# Patient Record
Sex: Female | Born: 1935 | Race: White | Hispanic: No | Smoking: Current every day smoker
Health system: Southern US, Community
[De-identification: ages and names within clinical notes are randomized; demographics above are authoritative.]

## PROBLEM LIST (undated history)

## (undated) DIAGNOSIS — I509 Heart failure, unspecified: Secondary | ICD-10-CM

## (undated) DIAGNOSIS — J449 Chronic obstructive pulmonary disease, unspecified: Secondary | ICD-10-CM

## (undated) HISTORY — PX: HERNIA REPAIR: SHX51

---

## 2016-03-24 ENCOUNTER — Inpatient Hospital Stay (HOSPITAL_COMMUNITY): Payer: Medicare Other

## 2016-03-24 ENCOUNTER — Inpatient Hospital Stay (HOSPITAL_COMMUNITY)
Admission: AD | Admit: 2016-03-24 | Discharge: 2016-04-01 | DRG: 871 | Disposition: A | Discharge status: Skilled Nursing Facility | Payer: Medicare Other | Source: Other Acute Inpatient Hospital | Attending: Internal Medicine | Admitting: Internal Medicine

## 2016-03-24 DIAGNOSIS — H9190 Unspecified hearing loss, unspecified ear: Secondary | ICD-10-CM | POA: Diagnosis present

## 2016-03-24 DIAGNOSIS — R6521 Severe sepsis with septic shock: Secondary | ICD-10-CM | POA: Diagnosis present

## 2016-03-24 DIAGNOSIS — N179 Acute kidney failure, unspecified: Secondary | ICD-10-CM | POA: Diagnosis present

## 2016-03-24 DIAGNOSIS — E874 Mixed disorder of acid-base balance: Secondary | ICD-10-CM | POA: Diagnosis present

## 2016-03-24 DIAGNOSIS — J9622 Acute and chronic respiratory failure with hypercapnia: Secondary | ICD-10-CM | POA: Diagnosis present

## 2016-03-24 DIAGNOSIS — F17210 Nicotine dependence, cigarettes, uncomplicated: Secondary | ICD-10-CM | POA: Diagnosis present

## 2016-03-24 DIAGNOSIS — J9601 Acute respiratory failure with hypoxia: Secondary | ICD-10-CM

## 2016-03-24 DIAGNOSIS — Z6826 Body mass index (BMI) 26.0-26.9, adult: Secondary | ICD-10-CM | POA: Diagnosis not present

## 2016-03-24 DIAGNOSIS — J189 Pneumonia, unspecified organism: Secondary | ICD-10-CM | POA: Diagnosis present

## 2016-03-24 DIAGNOSIS — E7800 Pure hypercholesterolemia, unspecified: Secondary | ICD-10-CM | POA: Diagnosis present

## 2016-03-24 DIAGNOSIS — T380X5A Adverse effect of glucocorticoids and synthetic analogues, initial encounter: Secondary | ICD-10-CM | POA: Diagnosis present

## 2016-03-24 DIAGNOSIS — J9602 Acute respiratory failure with hypercapnia: Secondary | ICD-10-CM

## 2016-03-24 DIAGNOSIS — Z9981 Dependence on supplemental oxygen: Secondary | ICD-10-CM | POA: Diagnosis not present

## 2016-03-24 DIAGNOSIS — R0602 Shortness of breath: Secondary | ICD-10-CM | POA: Diagnosis present

## 2016-03-24 DIAGNOSIS — G250 Essential tremor: Secondary | ICD-10-CM | POA: Diagnosis present

## 2016-03-24 DIAGNOSIS — J849 Interstitial pulmonary disease, unspecified: Secondary | ICD-10-CM | POA: Diagnosis present

## 2016-03-24 DIAGNOSIS — I480 Paroxysmal atrial fibrillation: Secondary | ICD-10-CM | POA: Diagnosis present

## 2016-03-24 DIAGNOSIS — J441 Chronic obstructive pulmonary disease with (acute) exacerbation: Secondary | ICD-10-CM | POA: Diagnosis present

## 2016-03-24 DIAGNOSIS — D72829 Elevated white blood cell count, unspecified: Secondary | ICD-10-CM | POA: Diagnosis present

## 2016-03-24 DIAGNOSIS — J9621 Acute and chronic respiratory failure with hypoxia: Secondary | ICD-10-CM | POA: Diagnosis present

## 2016-03-24 DIAGNOSIS — R0902 Hypoxemia: Secondary | ICD-10-CM

## 2016-03-24 DIAGNOSIS — I248 Other forms of acute ischemic heart disease: Secondary | ICD-10-CM | POA: Diagnosis present

## 2016-03-24 DIAGNOSIS — R739 Hyperglycemia, unspecified: Secondary | ICD-10-CM | POA: Diagnosis present

## 2016-03-24 DIAGNOSIS — I10 Essential (primary) hypertension: Secondary | ICD-10-CM | POA: Diagnosis present

## 2016-03-24 DIAGNOSIS — D649 Anemia, unspecified: Secondary | ICD-10-CM | POA: Diagnosis present

## 2016-03-24 DIAGNOSIS — A419 Sepsis, unspecified organism: Principal | ICD-10-CM | POA: Diagnosis present

## 2016-03-24 DIAGNOSIS — I700 Atherosclerosis of aorta: Secondary | ICD-10-CM | POA: Diagnosis present

## 2016-03-24 DIAGNOSIS — R0600 Dyspnea, unspecified: Secondary | ICD-10-CM | POA: Diagnosis not present

## 2016-03-24 DIAGNOSIS — E46 Unspecified protein-calorie malnutrition: Secondary | ICD-10-CM | POA: Diagnosis present

## 2016-03-24 DIAGNOSIS — E870 Hyperosmolality and hypernatremia: Secondary | ICD-10-CM | POA: Diagnosis present

## 2016-03-24 DIAGNOSIS — J440 Chronic obstructive pulmonary disease with acute lower respiratory infection: Secondary | ICD-10-CM | POA: Diagnosis present

## 2016-03-24 DIAGNOSIS — J9690 Respiratory failure, unspecified, unspecified whether with hypoxia or hypercapnia: Secondary | ICD-10-CM

## 2016-03-24 DIAGNOSIS — I959 Hypotension, unspecified: Secondary | ICD-10-CM | POA: Diagnosis not present

## 2016-03-24 HISTORY — DX: Chronic obstructive pulmonary disease, unspecified: J44.9

## 2016-03-24 LAB — MAGNESIUM: MAGNESIUM: 1.8 mg/dL (ref 1.7–2.4)

## 2016-03-24 LAB — COMPREHENSIVE METABOLIC PANEL
ALBUMIN: 2.4 g/dL — AB (ref 3.5–5.0)
ALT: 16 U/L (ref 14–54)
AST: 20 U/L (ref 15–41)
Alkaline Phosphatase: 61 U/L (ref 38–126)
Anion gap: 8 (ref 5–15)
BUN: 15 mg/dL (ref 6–20)
CHLORIDE: 104 mmol/L (ref 101–111)
CO2: 34 mmol/L — AB (ref 22–32)
Calcium: 8.6 mg/dL — ABNORMAL LOW (ref 8.9–10.3)
Creatinine, Ser: 0.95 mg/dL (ref 0.44–1.00)
GFR calc Af Amer: >60 mL/min (ref >60)
GFR, EST NON AFRICAN AMERICAN: 55 mL/min — AB (ref >60)
Glucose, Bld: 136 mg/dL — ABNORMAL HIGH (ref 65–99)
POTASSIUM: 4.7 mmol/L (ref 3.5–5.1)
SODIUM: 146 mmol/L — AB (ref 135–145)
Total Bilirubin: 0.6 mg/dL (ref 0.3–1.2)
Total Protein: 5.7 g/dL — ABNORMAL LOW (ref 6.5–8.1)

## 2016-03-24 LAB — CBC WITH DIFFERENTIAL/PLATELET
BASOS ABS: 0 10*3/uL (ref 0.0–0.1)
Basophils Relative: 0 %
EOS ABS: 0 10*3/uL (ref 0.0–0.7)
EOS PCT: 0 %
HCT: 33.4 % — ABNORMAL LOW (ref 36.0–46.0)
Hemoglobin: 9.7 g/dL — ABNORMAL LOW (ref 12.0–15.0)
LYMPHS PCT: 4 %
Lymphs Abs: 0.6 10*3/uL — ABNORMAL LOW (ref 0.7–4.0)
MCH: 28.7 pg (ref 26.0–34.0)
MCHC: 29 g/dL — ABNORMAL LOW (ref 30.0–36.0)
MCV: 98.8 fL (ref 78.0–100.0)
Monocytes Absolute: 0.4 10*3/uL (ref 0.1–1.0)
Monocytes Relative: 3 %
NEUTROS PCT: 93 %
Neutro Abs: 12.7 10*3/uL — ABNORMAL HIGH (ref 1.7–7.7)
PLATELETS: 252 10*3/uL (ref 150–400)
RBC: 3.38 MIL/uL — AB (ref 3.87–5.11)
RDW: 16.2 % — ABNORMAL HIGH (ref 11.5–15.5)
WBC: 13.7 10*3/uL — AB (ref 4.0–10.5)

## 2016-03-24 LAB — GLUCOSE, CAPILLARY
GLUCOSE-CAPILLARY: 142 mg/dL — AB (ref 65–99)
GLUCOSE-CAPILLARY: 164 mg/dL — AB (ref 65–99)
Glucose-Capillary: 144 mg/dL — ABNORMAL HIGH (ref 65–99)

## 2016-03-24 LAB — BLOOD GAS, ARTERIAL
Acid-Base Excess: 10.5 mmol/L — ABNORMAL HIGH (ref 0.0–2.0)
BICARBONATE: 35.6 mmol/L — AB (ref 20.0–28.0)
Drawn by: 39899
FIO2: 30
LHR: 28 {breaths}/min
O2 Saturation: 89.8 %
PEEP: 5 cmH2O
PO2 ART: 55.1 mmHg — AB (ref 83.0–108.0)
Patient temperature: 99.9
VT: 310 mL
pCO2 arterial: 60.8 mmHg — ABNORMAL HIGH (ref 32.0–48.0)
pH, Arterial: 7.39 (ref 7.350–7.450)

## 2016-03-24 LAB — TRIGLYCERIDES: TRIGLYCERIDES: 98 mg/dL (ref <150)

## 2016-03-24 LAB — PHOSPHORUS: Phosphorus: 2.4 mg/dL — ABNORMAL LOW (ref 2.5–4.6)

## 2016-03-24 LAB — PROCALCITONIN: Procalcitonin: 0.14 ng/mL

## 2016-03-24 LAB — MRSA PCR SCREENING: MRSA BY PCR: NEGATIVE

## 2016-03-24 LAB — TROPONIN I
TROPONIN I: 0.05 ng/mL — AB (ref <0.03)
Troponin I: 0.06 ng/mL (ref <0.03)

## 2016-03-24 LAB — LACTIC ACID, PLASMA
LACTIC ACID, VENOUS: 2.5 mmol/L — AB (ref 0.5–1.9)
LACTIC ACID, VENOUS: 2.9 mmol/L — AB (ref 0.5–1.9)

## 2016-03-24 MED ORDER — FENTANYL CITRATE (PF) 100 MCG/2ML IJ SOLN: 50.0000 ug | INTRAMUSCULAR | Status: DC | PRN

## 2016-03-24 MED ORDER — METHYLPREDNISOLONE SODIUM SUCC 40 MG IJ SOLR
20.0000 mg | Freq: Two times a day (BID) | INTRAMUSCULAR | Status: DC
  Administered 2016-03-24 – 2016-03-27 (×6): 20 mg via INTRAVENOUS
  Filled 2016-03-24 (×6): qty 0.5

## 2016-03-24 MED ORDER — VITAL HIGH PROTEIN PO LIQD
1000.0000 mL | ORAL | Status: DC
  Administered 2016-03-24: 1000 mL

## 2016-03-24 MED ORDER — NICOTINE 21 MG/24HR TD PT24
21.0000 mg | MEDICATED_PATCH | Freq: Every day | TRANSDERMAL | Status: DC
  Administered 2016-03-25 – 2016-04-01 (×8): 21 mg via TRANSDERMAL
  Filled 2016-03-24 (×8): qty 1

## 2016-03-24 MED ORDER — HEPARIN SODIUM (PORCINE) 5000 UNIT/ML IJ SOLN
5000.0000 [IU] | Freq: Three times a day (TID) | INTRAMUSCULAR | Status: DC
  Administered 2016-03-24 – 2016-04-01 (×24): 5000 [IU] via SUBCUTANEOUS
  Filled 2016-03-24 (×27): qty 1

## 2016-03-24 MED ORDER — ORAL CARE MOUTH RINSE
15.0000 mL | OROMUCOSAL | Status: DC
  Administered 2016-03-24 – 2016-03-27 (×26): 15 mL via OROMUCOSAL

## 2016-03-24 MED ORDER — BUDESONIDE 0.5 MG/2ML IN SUSP
0.5000 mg | Freq: Two times a day (BID) | RESPIRATORY_TRACT | Status: DC
  Administered 2016-03-24 – 2016-04-01 (×16): 0.5 mg via RESPIRATORY_TRACT
  Filled 2016-03-24 (×17): qty 2

## 2016-03-24 MED ORDER — IPRATROPIUM-ALBUTEROL 0.5-2.5 (3) MG/3ML IN SOLN
3.0000 mL | Freq: Four times a day (QID) | RESPIRATORY_TRACT | Status: DC
  Administered 2016-03-24 – 2016-03-25 (×3): 3 mL via RESPIRATORY_TRACT
  Filled 2016-03-24 (×3): qty 3

## 2016-03-24 MED ORDER — PRO-STAT SUGAR FREE PO LIQD
30.0000 mL | Freq: Two times a day (BID) | ORAL | Status: DC
  Administered 2016-03-24 – 2016-03-25 (×2): 30 mL
  Filled 2016-03-24 (×2): qty 30

## 2016-03-24 MED ORDER — INSULIN ASPART 100 UNIT/ML ~~LOC~~ SOLN
0.0000 [IU] | SUBCUTANEOUS | Status: DC
  Administered 2016-03-24 (×2): 1 [IU] via SUBCUTANEOUS
  Administered 2016-03-24 – 2016-03-25 (×3): 2 [IU] via SUBCUTANEOUS
  Administered 2016-03-25 (×2): 1 [IU] via SUBCUTANEOUS
  Administered 2016-03-25: 2 [IU] via SUBCUTANEOUS
  Administered 2016-03-26: 1 [IU] via SUBCUTANEOUS
  Administered 2016-03-26 (×3): 2 [IU] via SUBCUTANEOUS
  Administered 2016-03-26 (×3): 1 [IU] via SUBCUTANEOUS
  Administered 2016-03-27 (×2): 2 [IU] via SUBCUTANEOUS
  Administered 2016-03-27: 1 [IU] via SUBCUTANEOUS
  Administered 2016-03-29 (×2): 2 [IU] via SUBCUTANEOUS

## 2016-03-24 MED ORDER — FAMOTIDINE IN NACL 20-0.9 MG/50ML-% IV SOLN
20.0000 mg | Freq: Two times a day (BID) | INTRAVENOUS | Status: DC
  Administered 2016-03-24: 20 mg via INTRAVENOUS
  Filled 2016-03-24 (×4): qty 50

## 2016-03-24 MED ORDER — ACETAMINOPHEN 325 MG PO TABS
650.0000 mg | ORAL_TABLET | ORAL | Status: DC | PRN
  Administered 2016-04-01: 650 mg via ORAL

## 2016-03-24 MED ORDER — SODIUM CHLORIDE 0.9 % IV SOLN
250.0000 mL | INTRAVENOUS | Status: DC | PRN
  Administered 2016-03-26 – 2016-03-27 (×2): 250 mL via INTRAVENOUS

## 2016-03-24 MED ORDER — DEXTROSE 5 % IV SOLN
2.0000 g | INTRAVENOUS | Status: DC
  Administered 2016-03-24 – 2016-03-31 (×8): 2 g via INTRAVENOUS
  Filled 2016-03-24 (×9): qty 2

## 2016-03-24 MED ORDER — CHLORHEXIDINE GLUCONATE 0.12% ORAL RINSE (MEDLINE KIT)
15.0000 mL | Freq: Two times a day (BID) | OROMUCOSAL | Status: DC
  Administered 2016-03-24 – 2016-03-27 (×6): 15 mL via OROMUCOSAL

## 2016-03-24 MED ORDER — FENTANYL CITRATE (PF) 100 MCG/2ML IJ SOLN
50.0000 ug | INTRAMUSCULAR | Status: DC | PRN
  Administered 2016-03-24 – 2016-03-25 (×2): 50 ug via INTRAVENOUS
  Filled 2016-03-24 (×2): qty 2

## 2016-03-24 MED ORDER — DEXTROSE 5 % IV SOLN
250.0000 mg | INTRAVENOUS | Status: AC
  Administered 2016-03-24 – 2016-03-25 (×2): 250 mg via INTRAVENOUS
  Filled 2016-03-24 (×3): qty 250

## 2016-03-24 MED ORDER — VANCOMYCIN HCL IN DEXTROSE 1-5 GM/200ML-% IV SOLN
1000.0000 mg | INTRAVENOUS | Status: DC
  Administered 2016-03-24 – 2016-03-26 (×3): 1000 mg via INTRAVENOUS
  Filled 2016-03-24 (×4): qty 200

## 2016-03-24 MED ORDER — ASPIRIN EC 81 MG PO TBEC
81.0000 mg | DELAYED_RELEASE_TABLET | Freq: Every day | ORAL | Status: DC
  Administered 2016-03-24: 81 mg via ORAL
  Filled 2016-03-24 (×2): qty 1

## 2016-03-24 MED ORDER — PROPOFOL 1000 MG/100ML IV EMUL
0.0000 ug/kg/min | INTRAVENOUS | Status: DC
  Administered 2016-03-24: 15 ug/kg/min via INTRAVENOUS
  Administered 2016-03-25: 20 ug/kg/min via INTRAVENOUS
  Administered 2016-03-25 – 2016-03-26 (×2): 10 ug/kg/min via INTRAVENOUS
  Administered 2016-03-27: 20 ug/kg/min via INTRAVENOUS
  Filled 2016-03-24 (×5): qty 100

## 2016-03-24 MED ORDER — PROPOFOL 1000 MG/100ML IV EMUL
0.0000 ug/kg/min | INTRAVENOUS | Status: DC
  Administered 2016-03-24: 15 ug/kg/min via INTRAVENOUS

## 2016-03-24 NOTE — Progress Notes (Signed)
Sputum sample obtained and sent down to main lab without complications.  

## 2016-03-24 NOTE — Progress Notes (Signed)
Pharmacy Antibiotic Note  [name] Dupuis is a 82 y.o. female transferred from [facility] on 03/24/2016 with VDRF.  Pharmacy has been consulted for vancomycin/cefepime dosing for possible sepsis/PNA. Pt was on rocephin and azithromycin prior to transfer. Scr 0.8 per OSH record, weight = 49 kg per RN, est. crcl ~ 40-45 ml/min.    Plan: Vancomycin 1g IV Q 24 hrs Cefepime 2g IV Q 24 hrs  Monitor renal function and f/u cultures  Height: 4\' 7"  (139.7 cm) IBW/kg (Calculated) : 34  No data recorded.  No results for input(s): WBC, CREATININE, LATICACIDVEN, VANCOTROUGH, VANCOPEAK, VANCORANDOM, GENTTROUGH, GENTPEAK, GENTRANDOM, TOBRATROUGH, TOBRAPEAK, TOBRARND, AMIKACINPEAK, AMIKACINTROU, AMIKACIN in the last 168 hours.  CrCl cannot be calculated (Unknown ideal weight.).    Allergies not on file  Antimicrobials this admission: Vancomycin 9/25 >>  Cefepime 9/25 >>   Dose adjustments this admission:   Microbiology results: 9/25 BCx:   9/25 Sputum:  9/25 MRSA PCR:  Thank you for allowing pharmacy to be a part of this patient's care.  Bayard HuggerMei Rochel Privett, PharmD, BCPS  Clinical Pharmacist  Pager: [phone]   03/24/2016 4:56 PM

## 2016-03-24 NOTE — Progress Notes (Signed)
eLink Physician-Brief Progress Note Patient Name: [name] Paluch DOB: 12/30/1932 MRN: [id number]   Date of Service  03/24/2016  HPI/Events of Note  Lactic Acid = 2.6 >> 2.9. This presents a bit of a dilemma. CXR looks like mild CHF, No echo in EPIC. No CVL to check CVP.  eICU Interventions  Will ask ground to to place CVL and check CVP prior to giving more fluid.     Intervention Category Major Interventions: Acid-Base disturbance - evaluation and management  Niasha Devins Eugene 03/24/2016, 11:38 PM

## 2016-03-24 NOTE — Progress Notes (Signed)
eLink Physician-Brief Progress Note Patient Name: Dana [name] DOB: 02/13/1933 MRN: [id number]   Date of Service  03/24/2016  HPI/Events of Note  82 yo woman, transferred from [place] with VDRF. To the ICU on propofol, some evident agitation. Dr DeDios at bedside now.   eICU Interventions       Intervention Category Minor Interventions: Agitation / anxiety - evaluation and management  Dana [name]. 03/24/2016, 4:34 PM

## 2016-03-24 NOTE — H&P (Addendum)
PULMONARY / CRITICAL CARE MEDICINE   Name: Dana [name] MRN: [id number] DOB: 1932/07/14    ADMISSION DATE:  03/24/2016 CONSULTATION DATE:  9/25  REFERRING MD:  Boneta Lucks   CHIEF COMPLAINT:  Acute on chronic respiratory failure   HISTORY OF PRESENT ILLNESS:   82 year old female admitted to [facility] on 9/22 w/ working dx of CAP, AECOPD and  Sepsis. She is on 2 liters Oxygen at baseline. She was treated in usual fashion which included: azith/roceph, systemic steroids, inhaled BDs and supplemental oxygen.  Over the course of her stay she required escalating supplemental oxygen going from high-flow to BIPAP d/t increasing WOB. On the am of 9/25 the pt was desaturating further and also demonstrated increased CO2 retention w/ PCO2 of 92. CXR obtained that am raising concern for increasing edema. Was given IV lasix, but ultimately intubated. Her abx were widened to include: vanc and cefepime on 9/25, azithro was continued. She was accepted at [facility] but no beds were available so she was transferred to [facility].   PAST MEDICAL HISTORY :   Chronic obstructive airway disease, chronic hypoxic respiratory failure, secondary PAH, essential htn, LVHtremor, aortic sclerosis, tobacco abuse, hypercholesterolemia, hard of hearing,   PAST SURGICAL HISTORY: Prior hernia repair   ALLERGIES  No known drug allergies   MEDICATIONS (home meds) Atorvastatin 40mg /day, symbicort 80/4.5 2 puffs/bid, HCTZ 25mg ; take 1/2 tab day, lisinopril 20mg  daily, randidine 150mg  bid as needed for heart burn   FAMILY HISTORY:  Her mother had cancer, daughter has diabetes   SOCIAL HISTORY: She is an active smoker; 1ppd, has smoked since age 37.  Lives w/ daughter who assists with her medications   REVIEW OF SYSTEMS:   Unable-->sedated   SUBJECTIVE:  Comfortable on the vent. Had some agitation on transport but better now with propofol.  No increased WOB noted.  We dropped her Fio2 to 30% and her O2 sats remained  99%  VITAL SIGNS: Pulse 92   Resp (!) 28   Ht 4\' 7"  (1.397 m)   SpO2 100%     HEMODYNAMICS:    VENTILATOR SETTINGS: Vent Mode: PRVC FiO2 (%):  [30 %] 30 % Set Rate:  [28 bmp] 28 bmp Vt Set:  [310 mL] 310 mL PEEP:  [5 cmH20] 5 cmH20 Plateau Pressure:  [15 cmH20] 15 cmH20  INTAKE / OUTPUT: No intake/output data recorded.  PHYSICAL EXAMINATION: General:  Arousable. Comfortable.  Follows simple commands. NAD Neuro:  CN grossly intact. (-) lateralizing signs.  HEENT:  PERLA. (-) facial asymetry. ETT in place size 7.5. (-) NVD.  Cardiovascular:  Good s1/s2. (-) s3/m/r/g Lungs: good ae. Some crackles at bases. (-) rhonchi or wheezing heard. (-) accessory muscle use Abdomen:  (+) BS, soft, NT. (-) masses.  Musculoskeletal:  Warm extremities. Some trace edema. (-) clubbing.  Skin:  Warm and dry. Some erythematous bruising at BLE/legs.   LABS:  BMET No results for input(s): NA, K, CL, CO2, BUN, CREATININE, GLUCOSE in the last 168 hours.  Electrolytes No results for input(s): CALCIUM, MG, PHOS in the last 168 hours.  CBC No results for input(s): WBC, HGB, HCT, PLT in the last 168 hours.  Coag's No results for input(s): APTT, INR in the last 168 hours.  Sepsis Markers No results for input(s): LATICACIDVEN, PROCALCITON, O2SATVEN in the last 168 hours.  ABG No results for input(s): PHART, PCO2ART, PO2ART in the last 168 hours.  Liver Enzymes No results for input(s): AST, ALT, ALKPHOS, BILITOT, ALBUMIN in the  last 168 hours.  Cardiac Enzymes No results for input(s): TROPONINI, PROBNP in the last 168 hours.  Glucose No results for input(s): GLUCAP in the last 168 hours.  Imaging No results found.   STUDIES:  ECHO 9/25>>>  CULTURES: BC at [facility] (9/24)>>> Sputum 9/25>>>  ANTIBIOTICS: Rocephin 8/22>>>8/2 azithro 9/22>>> Cefepime 9/22>>> vanc 9/22>>>  SIGNIFICANT EVENTS: 9/22 to 9/25 admitted w/ working dx of AECOPD and CAP. Treated in usual fashion: ABX,  systemic steroids, BDs, O2. Continued to decline clinically w/ worsening WOB, hypercapnia and possible pulmonary edema. Intubated and transferred to [facility] on 9/25.    LINES/TUBES: OETT 9/25 at [facility]>>>  DISCUSSION: 82 year old female admitted to [facility] hospital w/ acute on chronic respiratory failure in setting of AECOPD and CAP. Course c/b troponin leak, and persistent/progressive respiratory failure ultimately leading to need for intubation. There was report of pulmonary edema prior to intubation. At this point the goal will be to first obtain CXR, then try to determine to what extent her worsening respiratory failure is due to PNA vs edema vs ALI, and also what degree her underlying obstructive lung disease is contributing. For now we will continue supportive care, abx, systemic steroids, inhaled BDs, and lasix as her BP and BUN/creatinine will permit. We will also repeat CEs, as well as obtain echo to r/o cardiac component. Extensive discussions w/ family were recorded at [facility] re: goals of care and risk of failure to wean. Ultimately on-going goals of care discussion will be needed.   ASSESSMENT / PLAN:  PULMONARY A: Acute on Chronic Hypoxic and Hypercarbic respiratory failure in setting of CAP and AECOPD +/- pulmonary edema vs ALI. R/O HCAP.  Tobacco abuse  P:   Full vent support. RR and TV were adjusted prior to transport as she was retaining.  ABG was 7.19, 103, 93 on 50% and TV 200 Rate of 24. Vent changes have been made. We decreased her Fio2 to 30% and her O2 sats were 99%. Check ABG in an hour. Doubt its ALI at this point.  Sputum culture PAD protocol Cont scheduled BDs Taper steroids slowly CXR now.  Cont broad spectrum abx.  Holding off on diuresis as BP dropped with diuresis earlier (got Lasix 40 mgIV).     CARDIOVASCULAR A:  H/o HTN  Elevated troponin (at [facility])-->felt demand ischemia  P:  Repeat 12 lead; CEs and obtain ECHO to r/o WM abnormality  Cont tele  Cont  asa  Holding home HCTZ and Lisinopril   RENAL A:   Mild Hypernatremia (Na 146) P:   Rpt BMP > may need free water per OGT Trend chemistry  KVO IVFs   GASTROINTESTINAL A:   Protein calorie malnutrition in setting of chronic illness as well as acute critical illness  P:   Nutritional consult-->start tubefeeds H2 blockade for SUP  HEMATOLOGIC A:   Anemia of critical illness. No evidence of active bleed  P:  [place] heparin  Trend CBC Transfuse per ICU protocol   INFECTIOUS A:   CAP; possible HCAP.  P:   Day 3/5 azith Day 0/x vanc Day 0/x cefepime  Get sputum culture  Trend PCT algo   ENDOCRINE A:   No acute  P:   Trend glucose  SS insulin q4 while NPO.   NEUROLOGIC A:   Sedation  P:   RASS goal: -1  PAD protocol  Fentanyl prn.    FAMILY  - Updates: I called up pt's daughter and was not able to get hold of  her.  Left a VM.   - Inter-disciplinary family meet or Palliative Care meeting due by:  10/1  Critical Care time with this patient today : 35 minutes. I reveiwed pt's course and labs at Care Everywhere.  She usually lives in 6060s for CO2.  Her WBC was slowly trending up (21 today) and could be related to steroids. CXR with possible LLL PNA and with inc vascular markings.    Dana MeyerJ. Angelo A de Dios, MD 03/24/2016, 5:15 PM [facility] Pager [phone] After 3 pm or if no answer, call [phone]

## 2016-03-24 NOTE — Progress Notes (Signed)
CRITICAL VALUE ALERT  Critical value received:  Troponin 0.06    Lactic Acid 2.5   Date of notification:  03/24/16  Time of notification:  2020  Critical value read back:Yes.    Nurse who received alert:  Marcellina MillinMindy Hopper RN  MD notified (1st page):  Dr. Delton CoombesByrum  Time of first page:  2045  MD notified (2nd page):  Time of second page:  Responding MD:  Dr. Delton CoombesByrum  Time MD responded:  2045

## 2016-03-25 ENCOUNTER — Inpatient Hospital Stay (HOSPITAL_COMMUNITY): Payer: Medicare Other

## 2016-03-25 DIAGNOSIS — A419 Sepsis, unspecified organism: Principal | ICD-10-CM

## 2016-03-25 LAB — CBC
HEMATOCRIT: 29.8 % — AB (ref 36.0–46.0)
HEMOGLOBIN: 8.7 g/dL — AB (ref 12.0–15.0)
MCH: 28.2 pg (ref 26.0–34.0)
MCHC: 29.2 g/dL — AB (ref 30.0–36.0)
MCV: 96.8 fL (ref 78.0–100.0)
Platelets: 222 10*3/uL (ref 150–400)
RBC: 3.08 MIL/uL — ABNORMAL LOW (ref 3.87–5.11)
RDW: 15.9 % — ABNORMAL HIGH (ref 11.5–15.5)
WBC: 14.2 10*3/uL — ABNORMAL HIGH (ref 4.0–10.5)

## 2016-03-25 LAB — GLUCOSE, CAPILLARY
GLUCOSE-CAPILLARY: 140 mg/dL — AB (ref 65–99)
GLUCOSE-CAPILLARY: 154 mg/dL — AB (ref 65–99)
GLUCOSE-CAPILLARY: 163 mg/dL — AB (ref 65–99)
Glucose-Capillary: 155 mg/dL — ABNORMAL HIGH (ref 65–99)
Glucose-Capillary: 160 mg/dL — ABNORMAL HIGH (ref 65–99)
Glucose-Capillary: 124 mg/dL — ABNORMAL HIGH (ref 65–99)

## 2016-03-25 LAB — BASIC METABOLIC PANEL
Anion gap: 7 (ref 5–15)
BUN: 23 mg/dL — AB (ref 6–20)
CHLORIDE: 104 mmol/L (ref 101–111)
CO2: 33 mmol/L — AB (ref 22–32)
CREATININE: 0.93 mg/dL (ref 0.44–1.00)
Calcium: 8.5 mg/dL — ABNORMAL LOW (ref 8.9–10.3)
GFR calc non Af Amer: 56 mL/min — ABNORMAL LOW (ref >60)
GLUCOSE: 147 mg/dL — AB (ref 65–99)
Potassium: 3.6 mmol/L (ref 3.5–5.1)
Sodium: 144 mmol/L (ref 135–145)

## 2016-03-25 LAB — BLOOD GAS, ARTERIAL
ACID-BASE EXCESS: 2.4 mmol/L — AB (ref 0.0–2.0)
BICARBONATE: 27.2 mmol/L (ref 20.0–28.0)
Drawn by: 345601
FIO2: 40
LHR: 18 {breaths}/min
O2 Saturation: 98.1 %
PEEP: 5 cmH2O
Patient temperature: 99.6
VT: 350 mL
pCO2 arterial: 49.2 mmHg — ABNORMAL HIGH (ref 32.0–48.0)
pH, Arterial: 7.364 (ref 7.350–7.450)
pO2, Arterial: 149 mmHg — ABNORMAL HIGH (ref 83.0–108.0)

## 2016-03-25 LAB — MAGNESIUM
Magnesium: 1.8 mg/dL (ref 1.7–2.4)
Magnesium: 2.1 mg/dL (ref 1.7–2.4)

## 2016-03-25 LAB — TROPONIN I: Troponin I: 0.06 ng/mL (ref <0.03)

## 2016-03-25 LAB — PHOSPHORUS
PHOSPHORUS: 1.1 mg/dL — AB (ref 2.5–4.6)
Phosphorus: 3 mg/dL (ref 2.5–4.6)

## 2016-03-25 LAB — PROCALCITONIN: Procalcitonin: 0.11 ng/mL

## 2016-03-25 LAB — LACTIC ACID, PLASMA: Lactic Acid, Venous: 1.9 mmol/L (ref 0.5–1.9)

## 2016-03-25 MED ORDER — FENTANYL CITRATE (PF) 100 MCG/2ML IJ SOLN
25.0000 ug | INTRAMUSCULAR | Status: DC | PRN
  Administered 2016-03-26: 50 ug via INTRAVENOUS
  Filled 2016-03-25: qty 2

## 2016-03-25 MED ORDER — POTASSIUM PHOSPHATES 15 MMOLE/5ML IV SOLN
30.0000 mmol | Freq: Once | INTRAVENOUS | Status: AC
  Administered 2016-03-25: 30 mmol via INTRAVENOUS
  Filled 2016-03-25: qty 10

## 2016-03-25 MED ORDER — VITAL AF 1.2 CAL PO LIQD
1000.0000 mL | ORAL | Status: DC
  Administered 2016-03-25 – 2016-03-26 (×2): 1000 mL
  Filled 2016-03-25 (×5): qty 1000

## 2016-03-25 MED ORDER — MAGNESIUM SULFATE IN D5W 1-5 GM/100ML-% IV SOLN
1.0000 g | Freq: Once | INTRAVENOUS | Status: AC
  Administered 2016-03-25: 1 g via INTRAVENOUS
  Filled 2016-03-25: qty 100

## 2016-03-25 MED ORDER — SODIUM CHLORIDE 0.9 % IV BOLUS (SEPSIS)
1000.0000 mL | Freq: Once | INTRAVENOUS | Status: AC
  Administered 2016-03-25: 1000 mL via INTRAVENOUS

## 2016-03-25 MED ORDER — POTASSIUM PHOSPHATE NICU ORAL SYRINGE 4.4 MEQ/ML
30.0000 mmol | Freq: Once | ORAL | Status: DC
  Filled 2016-03-25: qty 10

## 2016-03-25 MED ORDER — FAMOTIDINE IN NACL 20-0.9 MG/50ML-% IV SOLN
20.0000 mg | INTRAVENOUS | Status: DC
  Administered 2016-03-25 – 2016-03-29 (×5): 20 mg via INTRAVENOUS
  Filled 2016-03-25 (×6): qty 50

## 2016-03-25 MED ORDER — IPRATROPIUM-ALBUTEROL 0.5-2.5 (3) MG/3ML IN SOLN
3.0000 mL | RESPIRATORY_TRACT | Status: DC
  Administered 2016-03-25 – 2016-03-29 (×22): 3 mL via RESPIRATORY_TRACT
  Filled 2016-03-25 (×23): qty 3

## 2016-03-25 MED ORDER — POTASSIUM PHOSPHATES 15 MMOLE/5ML IV SOLN: 30.0000 mmol | Freq: Once | INTRAVENOUS | Status: DC

## 2016-03-25 MED ORDER — ASPIRIN 81 MG PO CHEW
81.0000 mg | CHEWABLE_TABLET | Freq: Every day | ORAL | Status: DC
  Administered 2016-03-25 – 2016-04-01 (×6): 81 mg via ORAL
  Filled 2016-03-25 (×6): qty 1

## 2016-03-25 MED ORDER — FENTANYL CITRATE (PF) 100 MCG/2ML IJ SOLN: 50.0000 ug | INTRAMUSCULAR | Status: DC | PRN

## 2016-03-25 NOTE — Progress Notes (Signed)
PCCM INTERVAL PROGRESS NOTE  To bedside to evaluate patient for CVL placement in the setting of lactic acid 2.5 > 2.9. Concern that she appeared volume overloaded on CXR earlier. She is on 30% FiO2 and 5 PEEP and has sats in the high 90s. She is hemodynamically stable on no pressors. She has poor urine output [facility]. Discussed with Dr. Marchelle Gearingamaswamy and at this time we will attempt IVF and repeat a lactic acid. No true benefit to CVL placement at this point.    Joneen RoachPaul Hoffman, AGACNP-BC [facility]/Critical Care Pager [phone] or [phone]  03/25/2016 12:15 AM

## 2016-03-25 NOTE — Progress Notes (Signed)
Initial Nutrition Assessment  DOCUMENTATION CODES:   Not applicable  INTERVENTION:    Initiate TF via OGT with Vital AF 1.2 at goal rate of 40 ml/h (960 ml per day) to provide 1152 kcals, 72 gm protein, 779 ml free water daily.  Total intake with TF and Propofol will be 1229 kcal (104% of estimated needs).  NUTRITION DIAGNOSIS:   Inadequate oral intake related to inability to eat as evidenced by NPO status.  GOAL:   Patient will meet greater than or equal to 90% of their needs  MONITOR:   Vent status, TF tolerance, I & O's, Labs  REASON FOR ASSESSMENT:   Consult Enteral/tube feeding initiation and management  ASSESSMENT:   82 yo female [facility]/22 w/ working dx of AECOPD and CAP. Continued to decline clinically w/ worsening WOB, hypercapnia and possible pulmonary edema. [facility].    Received MD Consult for TF initiation and management. Patient is currently intubated on ventilator support MV: 6.5 L/min Temp (24hrs), Avg:100 F (37.8 C), Min:99.5 F (37.5 C), Max:101 F (38.3 C)  Propofol: 2.9 ml/hr providing 77 kcal  Labs reviewed: phosphorus low Medications reviewed and include insulin, Mag sulfate, potassium phosphate, solumedrol, propofol.   Unable to complete Nutrition-Focused physical exam at this time.   Diet Order:  Diet NPO time specified  Skin:  Reviewed, no issues  Last BM:  unknown  Height:   Ht Readings from Last 1 Encounters:  03/24/16 4\' 7"  (1.397 m)    Weight:   Wt Readings from Last 1 Encounters:  03/25/16 112 lb 14 oz (51.2 kg)    Ideal Body Weight:  41.7 kg  BMI:  Body mass index is 26.23 kg/m.  Estimated Nutritional Needs:   Kcal:  1178  Protein:  65-75 gm  Fluid:  1.3-1.5 L  EDUCATION NEEDS:   No education needs identified at this time  Joaquin CourtsKimberly Marsi Turvey, RD, LDN, CNSC Pager [phone] After Hours Pager [phone]

## 2016-03-25 NOTE — H&P (Signed)
PULMONARY / CRITICAL CARE MEDICINE   Name: Dana [name] MRN: [id number] DOB: 08/08/1932    ADMISSION DATE:  03/24/2016 CONSULTATION DATE:  9/25  REFERRING MD:  Boneta Lucks   CHIEF COMPLAINT:  Acute on chronic respiratory failure   HISTORY OF PRESENT ILLNESS:   82 year old female admitted to [facility] hospital on 9/22 w/ working dx of CAP, AECOPD and  Sepsis. She is on 2 liters Oxygen at baseline. She was treated in usual fashion which included: azith/roceph, systemic steroids, inhaled BDs and supplemental oxygen.  Over the course of her stay she required escalating supplemental oxygen going from high-flow to BIPAP d/t increasing WOB. On the am of 9/25 the pt was desaturating further and also demonstrated increased CO2 retention w/ PCO2 of 92. CXR obtained that am raising concern for increasing edema. Was given IV lasix, but ultimately intubated. Her abx were widened to include: vanc and cefepime on 9/25, azithro was continued. She was accepted at [facility] but no beds were available so she was transferred to [facility].   SUBJECTIVE:  Comfortable on the vent sedated with Propofol at 20 mcg WUA in process No increased WOB noted.  FiO2: 30%, no SBT yet this am.  REVIEW OF SYSTEMS:  Unable to obtain as patient is currently intubated and sedated.  VITAL SIGNS: BP (!) 113/56   Pulse 85   Temp 99.7 F (37.6 C) (Axillary)   Resp 15   Ht 4\' 7"  (1.397 m)   Wt 112 lb 14 oz (51.2 kg)   SpO2 95%   BMI 26.23 kg/m     HEMODYNAMICS:    VENTILATOR SETTINGS: Vent Mode: PRVC FiO2 (%):  [30 %] 30 % Set Rate:  [15 bmp-28 bmp] 15 bmp Vt Set:  [310 mL-450 mL] 450 mL PEEP:  [5 cmH20] 5 cmH20 Plateau Pressure:  [15 cmH20-22 cmH20] 22 cmH20  INTAKE / OUTPUT: I/O last 3 completed shifts: In: 2079.8 [I.V.:167.5; NG/GT:487.3; IV Piggyback:1425] Out: 800 [Urine:800]  PHYSICAL EXAMINATION: General:  Sedated, Arousable.   Follows simple commands. NAD Neuro:  CN grossly intact. (-) lateralizing signs.  HEENT:   PERLA. (-) facial asymetry. ETT in place size 7.5. (-) NVD.  Cardiovascular:  Good s1/s2. (-) s3/m/r/g Lungs: good AE., Some crackles at bases. (-) rhonchi or wheezing heard. (-) +accessory muscle use Abdomen:  (+) BS, soft, NT. (-) masses.  Musculoskeletal:  Warm extremities. Some trace edema. (-) clubbing.  Skin:  Warm and dry. Some erythematous bruising at BLE/legs.   LABS:  BMET  Recent Labs Lab 03/24/16 1829 03/25/16 0516  NA 146* 144  K 4.7 3.6  CL 104 104  CO2 34* 33*  BUN 15 23*  CREATININE 0.95 0.93  GLUCOSE 136* 147*    Electrolytes  Recent Labs Lab 03/24/16 1829 03/25/16 0516  CALCIUM 8.6* 8.5*  MG 1.8 1.8  PHOS 2.4* 1.1*    CBC  Recent Labs Lab 03/24/16 1829 03/25/16 0516  WBC 13.7* 14.2*  HGB 9.7* 8.7*  HCT 33.4* 29.8*  PLT 252 222    Coag's No results for input(s): APTT, INR in the last 168 hours.  Sepsis Markers  Recent Labs Lab 03/24/16 1817 03/24/16 1829 03/24/16 2213 03/25/16 0516  LATICACIDVEN 2.5*  --  2.9* 1.9  PROCALCITON  --  0.14  --  0.11    ABG  Recent Labs Lab 03/24/16 1750 03/25/16 0332  PHART 7.390 7.364  PCO2ART 60.8* 49.2*  PO2ART 55.1* 149*    Liver Enzymes  Recent Labs Lab 03/24/16  1829  AST 20  ALT 16  ALKPHOS 61  BILITOT 0.6  ALBUMIN 2.4*    Cardiac Enzymes  Recent Labs Lab 03/24/16 1829 03/24/16 2214 03/25/16 0516  TROPONINI 0.06* 0.05* 0.06*    Glucose  Recent Labs Lab 03/24/16 1701 03/24/16 1943 03/24/16 2353 03/25/16 0325 03/25/16 0842  GLUCAP 142* 164* 144* 140* 155*    Imaging Dg Chest Port 1 View  Result Date: 03/25/2016 CLINICAL DATA:  Ventilator dependent respiratory failure. Follow-up CHF and effusions. EXAM: PORTABLE CHEST 1 VIEW COMPARISON:  03/24/2016, 03/21/2016 and earlier. FINDINGS: Endotracheal tube tip in satisfactory position projecting approximately 5 cm above carina. Nasogastric tube courses below the diaphragm into the stomach. Cardiac silhouette  mildly to moderately enlarged, unchanged. Thoracic aorta atherosclerotic, unchanged. Pulmonary venous hypertension and mild interstitial pulmonary edema, slightly improved since yesterday. Stable small bilateral pleural effusions, left greater than right. Stable dense consolidation in the left lower lobe. Stable mild right basilar atelectasis. No new pulmonary parenchymal abnormalities. IMPRESSION: 1.  Support apparatus satisfactory. 2. Persistent mild CHF with slight improvement since yesterday. 3. Stable small bilateral pleural effusions, left greater than right. Stable dense left lower lobe atelectasis and pneumonia. Stable mild right basilar atelectasis. No new abnormalities. Electronically Signed   By: Hulan Saashomas  Lawrence M.D.   On: 03/25/2016 08:19   Dg Chest Port 1 View  Result Date: 03/24/2016 CLINICAL DATA:  Respiratory failure EXAM: PORTABLE CHEST 1 VIEW COMPARISON:  03/24/2016 FINDINGS: There is an ET tube with tip above the carina. Aortic atherosclerosis noted. Nasogastric tube tip is in the stomach. Heart size is normal. Bilateral pleural effusions are identified left greater than right. Diffuse pulmonary edema. IMPRESSION: Moderate CHF pattern is unchanged compared with previous exam. Electronically Signed   By: Signa Kellaylor  Stroud M.D.   On: 03/24/2016 18:02     STUDIES:  TTE >>>  MICROBIOLOGY: Blood Ctx (at [facility]) 9/24 >> MRSA PCR 9/25:  Negative Blood Ctx x2 9/25 >> Tracheal Asp Ctx 9/25 >>  ANTIBIOTICS: Rocephin 8/22>>>8/2 azithro 9/22>>> Cefepime 9/22>>> vanc 9/22>>>  SIGNIFICANT EVENTS: 9/22 - 9/25 admitted w/ working dx of AECOPD and CAP. Treated in usual fashion: ABX, systemic steroids, BDs, O2. Continued to decline clinically w/ worsening WOB, hypercapnia and possible pulmonary edema. [facility] - [facility]ub[facility] & [facility]M [facility].   LINES/TUBES: OETT 7.5 9/25 >> OGT 9/25 >> Foley 9/25 >> PIV x2  DISCUSSION: 82 year  [facility] w/ acute on chronic respiratory failure in setting of AECOPD and CAP. Course c/b troponin leak, and persistent/progressive respiratory failure ultimately leading to need for intubation. There was report of pulmonary edema prior to intubation. At this point the goal will be to first obtain CXR, then try to determine to what extent her worsening respiratory failure is due to PNA vs edema vs ALI, and also what degree her underlying obstructive lung disease is contributing. For now we will continue supportive care, abx, systemic steroids, inhaled BDs, and lasix as her BP and BUN/creatinine will permit. We will also repeat CEs, as well as obtain echo to r/o cardiac component. Extensive discussions w/ family were recorded at [facility] re: goals of care and risk of failure to wean. Ultimately on-going goals of care discussion will be needed.   ASSESSMENT / PLAN:  PULMONARY A: Acute on Chronic Hypoxic and Hypercarbic Respiratory Failure - Secondary to Severe CAP & COPD Exacerbation. Severe CAP Acute COPD Exacerbation  Tobacco Use Disorder  Small Bilateral Pleural Effusions - L>R  P:   Wean PEEP and FiO2 for SAO2 of 88-92% Follow Sputum culture PRVC@8cc /kg CXR & ABG intermittently SBT daily PAD protocol Changing Duoneb to q4hr Budesonide neb bid Solu-Medrol 20mg  IV q12hr ID as below Nicotine Patch 21mg /24hr  CARDIOVASCULAR A:  Elevated Troponin I - Likely demand ishcemia. H/O HTN  Possible Mild CHF  P:  Vitals per unit protocol Continuous Telemetry Monitoring TTE pending Maintain MAP>65 mmHG  Trend troponins Cont ASA 81mg  daily Holding home HCTZ and Lisinopril  Holding on diuresis  RENAL A:   Hypernatremia - Resolved. Hypophosphoremia - Replaced. Metabolic Alkalosis - Mild. Lactic Acidosis - Resolved.  P:   Monitoring UOP Trending electrolytes & renal function Replacing electrolytes as indicated MgSO4 1gm IV KPhos KVO  IVF  GASTROINTESTINAL A:   Protein-Calorie Malnutrition  P:   NPO Continuing Tube Feedings Pepcid IV q12hr  HEMATOLOGIC A:  Leukocytosis - Mild. Likely due to steroids & sepsis. Anemia - No signs of active bleeding. Hgb decreasing.  P:  Trending cell counts daily w/ CBC Transfuse for Hgb <8.0 Heparin [place] q8hr SCDs  INFECTIOUS A:   Sepsis Severe CAP  P:   Empiric Cefepime, Vancomycin, & Azithromycin as above Following Cultures to completion Trending Procalcitonin per algorithm Urine Strep & Legionella Antigens pending  ENDOCRINE A:   Hyperglycemia - Mild. No h/o DM. Likely due to steroids.  P:   Accu-Checks q4hr SSI per Sensitive Algorithm  NEUROLOGIC A:   Sedation on Ventilator  P:   RASS goal: 0 to -1  Propofol gtt Fentanyl IV prn Pain WUA daily  FAMILY  - Updates: No family at bedside today. Dr. Christene Slates did leave VM for daughter 9/25, but no return call to the unit. Code status,/goals of care will need to be addressed when family contact is established.  - Inter-disciplinary family meet or Palliative Care meeting due by:  10/1  Bevelyn Ngo, AGACNP-BC [facility] Pulmonary/Critical Care Medicine Pager # [phone] 03/25/2016  PCCM Attending Note: Patient seen and examined with nurse practitioner. Please refer to her progress note/H&P which I have reviewed in detail. Patient continues to require ventilator support intolerant of pressure support wean today. Continuing broad-spectrum antibiotics while awaiting finalization of cultures and infectious workup. Decreasing frequency of scheduled bronchodilator regimen. Awaiting echocardiogram results given mild elevation in troponin I but this is likely stress-induced subendocardial ischemia. Continuing close monitoring in the ICU while awaiting family contact. Patient remains critically ill.  I have spent a total of 32 minutes of critical care time today caring for the patient and reviewing the patient's  electronic medical record.  Donna Christen Jamison Neighbor, M.D. [facility] Pulmonary & Critical Care Pager:  [phone] After 3pm or if no response, call [phone] 1:05 PM 03/25/16

## 2016-03-26 ENCOUNTER — Inpatient Hospital Stay (HOSPITAL_COMMUNITY): Payer: Medicare Other

## 2016-03-26 DIAGNOSIS — R0600 Dyspnea, unspecified: Secondary | ICD-10-CM

## 2016-03-26 DIAGNOSIS — J441 Chronic obstructive pulmonary disease with (acute) exacerbation: Secondary | ICD-10-CM

## 2016-03-26 LAB — CBC WITH DIFFERENTIAL/PLATELET
BASOS ABS: 0 10*3/uL (ref 0.0–0.1)
BASOS PCT: 0 %
EOS PCT: 0 %
Eosinophils Absolute: 0 10*3/uL (ref 0.0–0.7)
HEMATOCRIT: 28.9 % — AB (ref 36.0–46.0)
HEMOGLOBIN: 8.8 g/dL — AB (ref 12.0–15.0)
LYMPHS ABS: 1.8 10*3/uL (ref 0.7–4.0)
LYMPHS PCT: 10 %
MCH: 28.7 pg (ref 26.0–34.0)
MCHC: 30.4 g/dL (ref 30.0–36.0)
MCV: 94.1 fL (ref 78.0–100.0)
MONOS PCT: 10 %
Monocytes Absolute: 1.8 10*3/uL — ABNORMAL HIGH (ref 0.1–1.0)
NEUTROS ABS: 14.8 10*3/uL — AB (ref 1.7–7.7)
Neutrophils Relative %: 80 %
PLATELETS: 213 10*3/uL (ref 150–400)
RBC: 3.07 MIL/uL — ABNORMAL LOW (ref 3.87–5.11)
RDW: 15.9 % — ABNORMAL HIGH (ref 11.5–15.5)
WBC: 18.4 10*3/uL — ABNORMAL HIGH (ref 4.0–10.5)

## 2016-03-26 LAB — ECHOCARDIOGRAM COMPLETE
HEIGHTINCHES: 55 in
WEIGHTICAEL: 1841.28 [oz_av]

## 2016-03-26 LAB — BASIC METABOLIC PANEL
ANION GAP: 8 (ref 5–15)
ANION GAP: 4 — AB (ref 5–15)
BUN: 26 mg/dL — ABNORMAL HIGH (ref 6–20)
BUN: 28 mg/dL — ABNORMAL HIGH (ref 6–20)
CALCIUM: 8.5 mg/dL — AB (ref 8.9–10.3)
CHLORIDE: 103 mmol/L (ref 101–111)
CO2: 33 mmol/L — AB (ref 22–32)
CO2: 36 mmol/L — ABNORMAL HIGH (ref 22–32)
Calcium: 8.3 mg/dL — ABNORMAL LOW (ref 8.9–10.3)
Chloride: 101 mmol/L (ref 101–111)
Creatinine, Ser: 0.81 mg/dL (ref 0.44–1.00)
Creatinine, Ser: 0.81 mg/dL (ref 0.44–1.00)
GFR calc Af Amer: >60 mL/min (ref >60)
GFR calc non Af Amer: >60 mL/min (ref >60)
GLUCOSE: 134 mg/dL — AB (ref 65–99)
Glucose, Bld: 157 mg/dL — ABNORMAL HIGH (ref 65–99)
POTASSIUM: 4 mmol/L (ref 3.5–5.1)
POTASSIUM: 4.4 mmol/L (ref 3.5–5.1)
Sodium: 144 mmol/L (ref 135–145)
Sodium: 141 mmol/L (ref 135–145)

## 2016-03-26 LAB — GLUCOSE, CAPILLARY
GLUCOSE-CAPILLARY: 125 mg/dL — AB (ref 65–99)
GLUCOSE-CAPILLARY: 152 mg/dL — AB (ref 65–99)
GLUCOSE-CAPILLARY: 168 mg/dL — AB (ref 65–99)
Glucose-Capillary: 130 mg/dL — ABNORMAL HIGH (ref 65–99)
Glucose-Capillary: 132 mg/dL — ABNORMAL HIGH (ref 65–99)
Glucose-Capillary: 146 mg/dL — ABNORMAL HIGH (ref 65–99)

## 2016-03-26 LAB — PROCALCITONIN: PROCALCITONIN: 0.12 ng/mL

## 2016-03-26 LAB — CULTURE, RESPIRATORY: CULTURE: NO GROWTH

## 2016-03-26 LAB — PHOSPHORUS
Phosphorus: 2.7 mg/dL (ref 2.5–4.6)
Phosphorus: 2.8 mg/dL (ref 2.5–4.6)

## 2016-03-26 LAB — TROPONIN I
TROPONIN I: 0.07 ng/mL — AB (ref <0.03)
Troponin I: 0.09 ng/mL (ref <0.03)

## 2016-03-26 LAB — CULTURE, RESPIRATORY W GRAM STAIN

## 2016-03-26 LAB — MAGNESIUM
MAGNESIUM: 2.2 mg/dL (ref 1.7–2.4)
Magnesium: 2 mg/dL (ref 1.7–2.4)

## 2016-03-26 LAB — TRIGLYCERIDES: Triglycerides: 86 mg/dL (ref <150)

## 2016-03-26 LAB — LEGIONELLA PNEUMOPHILA SEROGP 1 UR AG: L. PNEUMOPHILA SEROGP 1 UR AG: NEGATIVE

## 2016-03-26 MED ORDER — AMIODARONE LOAD VIA INFUSION
150.0000 mg | Freq: Once | INTRAVENOUS | Status: AC
  Administered 2016-03-26: 150 mg via INTRAVENOUS
  Filled 2016-03-26: qty 83.34

## 2016-03-26 MED ORDER — AMIODARONE HCL IN DEXTROSE 360-4.14 MG/200ML-% IV SOLN
60.0000 mg/h | INTRAVENOUS | Status: AC
  Administered 2016-03-27: 60 mg/h via INTRAVENOUS
  Filled 2016-03-26 (×2): qty 200

## 2016-03-26 MED ORDER — AMIODARONE HCL IN DEXTROSE 360-4.14 MG/200ML-% IV SOLN
30.0000 mg/h | INTRAVENOUS | Status: DC
  Administered 2016-03-27 – 2016-03-29 (×5): 30 mg/h via INTRAVENOUS
  Filled 2016-03-26 (×10): qty 200

## 2016-03-26 MED ORDER — METOPROLOL TARTRATE 5 MG/5ML IV SOLN
INTRAVENOUS | Status: AC
  Administered 2016-03-26: 5 mg
  Filled 2016-03-26: qty 5

## 2016-03-26 MED ORDER — DEXTROSE 5 % IV SOLN
5.0000 mg/h | INTRAVENOUS | Status: DC
  Filled 2016-03-26: qty 100

## 2016-03-26 MED ORDER — METOPROLOL TARTRATE 5 MG/5ML IV SOLN: 5.0000 mg | Freq: Once | INTRAVENOUS | Status: AC

## 2016-03-26 MED ORDER — DILTIAZEM LOAD VIA INFUSION
15.0000 mg | Freq: Once | INTRAVENOUS | Status: AC
  Administered 2016-03-26: 15 mg via INTRAVENOUS
  Filled 2016-03-26: qty 15

## 2016-03-26 NOTE — Progress Notes (Addendum)
PULMONARY / CRITICAL CARE MEDICINE   Name: Dana [name] MRN: [id number] DOB: 06/22/1933    ADMISSION DATE:  03/24/2016 CONSULTATION DATE: Joanne Gavel 9/25  REFERRING MD:  Boneta LucksHalpert   CHIEF COMPLAINT:  Acute on chronic respiratory failure   HISTORY OF PRESENT ILLNESS:   82 year old female admitted to [facility] on 9/22 w/ working dx of CAP, AECOPD and  Sepsis. She is on 2 liters Oxygen at baseline. She was treated in usual fashion which included: azith/roceph, systemic steroids, inhaled BDs and supplemental oxygen.  Over the course of her stay she required escalating supplemental oxygen going from high-flow to BIPAP d/t increasing WOB. On the am of 9/25 the pt was desaturating further and also demonstrated increased CO2 retention w/ PCO2 of 92. CXR obtained that am raising concern for increasing edema. Was given IV lasix, but ultimately intubated. Her abx were widened to include: vanc and cefepime on 9/25, azithro was continued. [facility] but [facility] beds were available so [facility]   SUBJECTIVE: No acute events overnight. Patient nods no to any pain or difficulty breathing. Still with tachypnea on SBT PS 5/5.   REVIEW OF SYSTEMS:  Unable to obtain as patient is currently intubated and sedated.  VITAL SIGNS: BP (!) 103/56   Pulse (!) 101   Temp 100 F (37.8 C) (Oral)   Resp (!) 31   Ht 4\' 7"  (1.397 m)   Wt 115 lb 1.3 oz (52.2 kg)   SpO2 98%   BMI 26.75 kg/m     HEMODYNAMICS:    VENTILATOR SETTINGS: Vent Mode: PSV;CPAP FiO2 (%):  [30 %] 30 % Set Rate:  [15 bmp] 15 bmp Vt Set:  [310 mL] 310 mL PEEP:  [5 cmH20] 5 cmH20 Pressure Support:  [5 cmH20-10 cmH20] 5 cmH20 Plateau Pressure:  [13 cmH20-19 cmH20] 13 cmH20  INTAKE / OUTPUT: I/O last 3 completed shifts: In: 3877 [I.V.:447.4; NG/GT:1194.7; IV Piggyback:2235] Out: 1390 [Urine:1390]  PHYSICAL EXAMINATION: General:  Awake. No distress. No family at bedside.  Neuro:  Moving all 4 extremities. Head  tremor. Following commands & nods to questions.  HEENT:  ETT in place. No scleral icterus.  Cardiovascular:  Regular rhythm. Mildly tachycardic. No edema.  Lungs:  Slight improvement in bilateral wheezing. Symmetric chest rise on ventilator. Increased WOB on SBT. Abdomen:  Soft. Nondistended. Normal bowel sounds.  Skin:  Warm and dry. No rash on exposed skin.   LABS:  BMET  Recent Labs Lab 03/24/16 1829 03/25/16 0516 03/26/16 0706  NA 146* 144 144  K 4.7 3.6 4.0  CL 104 104 103  CO2 34* 33* 33*  BUN 15 23* 26*  CREATININE 0.95 0.93 0.81  GLUCOSE 136* 147* 134*    Electrolytes  Recent Labs Lab 03/24/16 1829 03/25/16 0516 03/25/16 1834 03/26/16 0706  CALCIUM 8.6* 8.5*  --  8.3*  MG 1.8 1.8 2.1 2.0  PHOS 2.4* 1.1* 3.0 2.7    CBC  Recent Labs Lab 03/24/16 1829 03/25/16 0516 03/26/16 0706  WBC 13.7* 14.2* 18.4*  HGB 9.7* 8.7* 8.8*  HCT 33.4* 29.8* 28.9*  PLT 252 222 213    Coag's No results for input(s): APTT, INR in the last 168 hours.  Sepsis Markers  Recent Labs Lab 03/24/16 1817 03/24/16 1829 03/24/16 2213 03/25/16 0516 03/26/16 0706  LATICACIDVEN 2.5*  --  2.9* 1.9  --   PROCALCITON  --  0.14  --  0.11 0.12    ABG  Recent Labs Lab 03/24/16 1750 03/25/16 0332  PHART 7.390 7.364  PCO2ART 60.8* 49.2*  PO2ART 55.1* 149*    Liver Enzymes  Recent Labs Lab 03/24/16 1829  AST 20  ALT 16  ALKPHOS 61  BILITOT 0.6  ALBUMIN 2.4*    Cardiac Enzymes  Recent Labs Lab 03/24/16 2214 03/25/16 0516 03/26/16 0706  TROPONINI 0.05* 0.06* 0.09*    Glucose  Recent Labs Lab 03/25/16 0842 03/25/16 1150 03/25/16 1600 03/25/16 1942 03/25/16 2334 03/26/16 0328  GLUCAP 155* 160* 124* 163* 154* 130*    Imaging Dg Chest Port 1 View  Result Date: 03/26/2016 CLINICAL DATA:  Hypoxia EXAM: PORTABLE CHEST 1 VIEW COMPARISON:  March 25, 2016 FINDINGS: Endotracheal tube tip is 4.8 cm above the carina. Nasogastric tube tip and side port  are below the diaphragm. No pneumothorax. There are small pleural effusions bilaterally with interstitial edema and cardiomegaly. The pulmonary vascularity appears within normal limits. There is patchy airspace consolidation in the left lower lobe, stable. There is atherosclerotic calcification in the aorta. No adenopathy. IMPRESSION: Tube positions as described without pneumothorax. Evidence of a degree of underlying congestive heart failure. Question alveolar edema versus pneumonia left lower lobe; both entities may exist concurrently. Stable cardiac silhouette. There is aortic atherosclerosis. Electronically Signed   By: Bretta Bang III M.D.   On: 03/26/2016 07:15     STUDIES:  TTE >>>  MICROBIOLOGY: Blood Ctx (at [facility]) 9/24 >> Lower Resp Ctx (at [facility]) >> MRSA PCR 9/25:  Negative Blood Ctx x2 9/25 >> Tracheal Asp Ctx 9/25 >>  ANTIBIOTICS: Rocephin 8/22>>>8/2 azithro 9/22>>> Cefepime 9/22>>> vanc 9/22>>>  SIGNIFICANT EVENTS: 9/22 - 9/25 admitted w/ working dx of AECOPD and CAP. Treated in usual fashion: ABX, systemic steroids, BDs, O2. Continued to decline clinically w/ worsening WOB, hypercapnia and possible pulmonary edema. Intubated and transferred to [facility] on  9/25 - Intubated & transferred to [facility] for PCCM management.   LINES/TUBES: OETT 7.5 9/25 >> OGT 9/25 >> Foley 9/25 >> PIV x2  ASSESSMENT / PLAN:  PULMONARY A: Acute on Chronic Hypoxic and Hypercarbic Respiratory Failure - Secondary to Severe CAP & COPD Exacerbation. Severe CAP Acute COPD Exacerbation  Tobacco Use Disorder  Small Bilateral Pleural Effusions - L>R  P:   Full Vent Support @ 8cc/kg IBW CXR & ABG intermittently SBT daily Duoneb to q4hr Budesonide neb bid Solu-Medrol 20mg  IV q12hr ID as below Nicotine Patch 21mg /24hr  CARDIOVASCULAR A:  Elevated Troponin I - Likely demand ishcemia. H/O HTN  Possible Mild CHF  P:  Vitals per unit protocol Continuous Telemetry Monitoring TTE  pending Maintain MAP>65 mmHG  Cont ASA 81mg  daily Holding home HCTZ and Lisinopril  Holding on diuresis  RENAL A:   Hypernatremia - Resolved. Hypophosphoremia - Resolved. Metabolic Alkalosis - Mild. Lactic Acidosis - Resolved.  P:   Monitoring UOP Trending electrolytes & renal function Replacing electrolytes as indicated KVO IVF  GASTROINTESTINAL A:   Protein-Calorie Malnutrition  P:   NPO Continuing Tube Feedings Pepcid IV q12hr  HEMATOLOGIC A:  Leukocytosis - Worsening. Likely due to steroids & sepsis. Anemia - No signs of active bleeding. Hgb stable.  P:  Trending cell counts daily w/ CBC Transfuse for Hgb <8.0 Heparin Eleanor q8hr SCDs  INFECTIOUS A:   Sepsis Severe CAP  P:   Empiric Cefepime, Vancomycin, & Azithromycin as above Following Cultures to completion Trending Procalcitonin per algorithm Urine Strep & Legionella Antigens pending  ENDOCRINE A:   Hyperglycemia - Mild. No h/o  DM. Likely due to steroids.  P:   Accu-Checks q4hr SSI per Sensitive Algorithm  NEUROLOGIC A:   Sedation on Ventilator  P:   RASS goal: 0 to -1  Propofol gtt Fentanyl IV prn Pain WUA daily  FAMILY  - Updates: No family at bedside 9/27. Daughter Dana [name] updated by Dr. Jamison Neighbor via phone 9/27 & confirms Full Code Status.   - Inter-disciplinary family meet or Palliative Care meeting due by:  10/1  TODAY'S SUMMAR:  83 y.o. female admitted to [facility] hospital w/ acute on chronic respiratory failure in setting of AECOPD and CAP. Course c/b troponin leak, and persistent/progressive respiratory failure ultimately leading to need for intubation. Improving with regards to ventilator requirement but still isn't stable for extubation. Awaiting culture results while continuing antibiotics.   I have spent a total of 31 minutes of critical care time today caring for the patient and reviewing the patient's electronic medical record.  Donna Christen Jamison Neighbor, M.D. [facility] Pulmonary &  Critical Care Pager:  [phone] After 3pm or if no response, call [phone] 8:26 AM 03/26/16

## 2016-03-26 NOTE — Progress Notes (Signed)
eLink Physician-Brief Progress Note Patient Name: Dana [name] DOB: 09/24/1932 MRN: [id number]   Date of Service  03/26/2016  HPI/Events of Note  Borderline BP, remains 140 HR  eICU Interventions  Add amio bolus and drip Titrate cardizem off as able over next 1 hr, unless BP drops     Intervention Category Major Interventions: Arrhythmia - evaluation and management  Buna Cuppett J. 03/26/2016, 10:14 PM

## 2016-03-26 NOTE — Progress Notes (Signed)
eLink Physician-Brief Progress Note Patient Name: Dana [name] DOB: 07/14/1932 MRN: [id number]   Date of Service  03/26/2016  HPI/Events of Note  After amio Not SR  eICU Interventions  Dc cardizem     Intervention Category Major Interventions: Arrhythmia - evaluation and management  Nelda BucksFEINSTEIN,Grantland Want J. 03/26/2016, 10:49 PM

## 2016-03-26 NOTE — Progress Notes (Signed)
eLink Physician-Brief Progress Note Patient Name: Dana [name] DOB: 02/23/1933 MRN: [id number]   Date of Service  03/26/2016  HPI/Events of Note  Looks like fib Get ecg, rvr Lytes, trop metop  eICU Interventions       Intervention Category Major Interventions: Arrhythmia - evaluation and management  Nelda BucksFEINSTEIN,Nuria Phebus J. 03/26/2016, 8:20 PM

## 2016-03-27 DIAGNOSIS — I480 Paroxysmal atrial fibrillation: Secondary | ICD-10-CM

## 2016-03-27 LAB — RENAL FUNCTION PANEL
ANION GAP: 9 (ref 5–15)
Albumin: 1.8 g/dL — ABNORMAL LOW (ref 3.5–5.0)
BUN: 26 mg/dL — ABNORMAL HIGH (ref 6–20)
CHLORIDE: 98 mmol/L — AB (ref 101–111)
CO2: 31 mmol/L (ref 22–32)
CREATININE: 0.8 mg/dL (ref 0.44–1.00)
Calcium: 7.8 mg/dL — ABNORMAL LOW (ref 8.9–10.3)
Glucose, Bld: 298 mg/dL — ABNORMAL HIGH (ref 65–99)
POTASSIUM: 4.2 mmol/L (ref 3.5–5.1)
Phosphorus: 1.9 mg/dL — ABNORMAL LOW (ref 2.5–4.6)
Sodium: 138 mmol/L (ref 135–145)

## 2016-03-27 LAB — GLUCOSE, CAPILLARY
GLUCOSE-CAPILLARY: 158 mg/dL — AB (ref 65–99)
Glucose-Capillary: 154 mg/dL — ABNORMAL HIGH (ref 65–99)
Glucose-Capillary: 139 mg/dL — ABNORMAL HIGH (ref 65–99)
Glucose-Capillary: 113 mg/dL — ABNORMAL HIGH (ref 65–99)
Glucose-Capillary: 111 mg/dL — ABNORMAL HIGH (ref 65–99)

## 2016-03-27 LAB — CBC WITH DIFFERENTIAL/PLATELET
Basophils Absolute: 0 10*3/uL (ref 0.0–0.1)
Basophils Relative: 0 %
EOS ABS: 0 10*3/uL (ref 0.0–0.7)
EOS PCT: 0 %
HEMATOCRIT: 28.5 % — AB (ref 36.0–46.0)
HEMOGLOBIN: 8.7 g/dL — AB (ref 12.0–15.0)
LYMPHS PCT: 6 %
Lymphs Abs: 1.4 10*3/uL (ref 0.7–4.0)
MCH: 28.4 pg (ref 26.0–34.0)
MCHC: 30.5 g/dL (ref 30.0–36.0)
MCV: 93.1 fL (ref 78.0–100.0)
Monocytes Absolute: 2.6 10*3/uL — ABNORMAL HIGH (ref 0.1–1.0)
Monocytes Relative: 11 %
NEUTROS ABS: 19.4 10*3/uL — AB (ref 1.7–7.7)
NEUTROS PCT: 83 %
Platelets: 214 10*3/uL (ref 150–400)
RBC: 3.06 MIL/uL — ABNORMAL LOW (ref 3.87–5.11)
RDW: 15.9 % — ABNORMAL HIGH (ref 11.5–15.5)
WBC: 23.4 10*3/uL — ABNORMAL HIGH (ref 4.0–10.5)

## 2016-03-27 LAB — TRIGLYCERIDES: Triglycerides: 100 mg/dL (ref <150)

## 2016-03-27 LAB — TSH: TSH: 0.648 u[IU]/mL (ref 0.350–4.500)

## 2016-03-27 LAB — STREP PNEUMONIAE URINARY ANTIGEN: STREP PNEUMO URINARY ANTIGEN: NEGATIVE

## 2016-03-27 LAB — TROPONIN I
TROPONIN I: 0.05 ng/mL — AB (ref <0.03)
TROPONIN I: 0.05 ng/mL — AB (ref <0.03)
TROPONIN I: 0.05 ng/mL — AB (ref <0.03)

## 2016-03-27 LAB — MAGNESIUM: MAGNESIUM: 1.9 mg/dL (ref 1.7–2.4)

## 2016-03-27 MED ORDER — ORAL CARE MOUTH RINSE
15.0000 mL | Freq: Two times a day (BID) | OROMUCOSAL | Status: DC
  Administered 2016-03-27 – 2016-04-01 (×8): 15 mL via OROMUCOSAL

## 2016-03-27 MED ORDER — METHYLPREDNISOLONE SODIUM SUCC 40 MG IJ SOLR
20.0000 mg | INTRAMUSCULAR | Status: DC
  Administered 2016-03-28 – 2016-03-29 (×2): 20 mg via INTRAVENOUS
  Filled 2016-03-27: qty 0.5
  Filled 2016-03-27: qty 1

## 2016-03-27 MED ORDER — MAGNESIUM SULFATE IN D5W 1-5 GM/100ML-% IV SOLN
1.0000 g | Freq: Once | INTRAVENOUS | Status: AC
  Administered 2016-03-27: 1 g via INTRAVENOUS
  Filled 2016-03-27: qty 100

## 2016-03-27 MED ORDER — SODIUM CHLORIDE 0.9 % IV BOLUS (SEPSIS)
500.0000 mL | Freq: Once | INTRAVENOUS | Status: AC
  Administered 2016-03-27: 500 mL via INTRAVENOUS

## 2016-03-27 MED ORDER — POTASSIUM & SODIUM PHOSPHATES 280-160-250 MG PO PACK
1.0000 | PACK | Freq: Two times a day (BID) | ORAL | Status: DC
  Administered 2016-03-27: 1
  Filled 2016-03-27 (×2): qty 1

## 2016-03-27 MED ORDER — SODIUM CHLORIDE 0.9 % IV BOLUS (SEPSIS)
250.0000 mL | Freq: Once | INTRAVENOUS | Status: AC
  Administered 2016-03-27: 250 mL via INTRAVENOUS

## 2016-03-27 MED ORDER — AMIODARONE IV BOLUS ONLY 150 MG/100ML
150.0000 mg | Freq: Once | INTRAVENOUS | Status: AC
  Administered 2016-03-27: 150 mg via INTRAVENOUS

## 2016-03-27 MED ORDER — DEXTROSE 5 % IV SOLN
0.0000 ug/min | INTRAVENOUS | Status: DC
  Administered 2016-03-27: 20 ug/min via INTRAVENOUS
  Filled 2016-03-27: qty 1

## 2016-03-27 NOTE — Care Management Important Message (Signed)
Important Message  Patient Details  Name: Dana [name] MRN: [id number] Date of Birth: 11/26/1932   Medicare Important Message Given:  Yes    Dorena BodoIris Saina Waage 03/27/2016, 1:03 PM

## 2016-03-27 NOTE — Progress Notes (Signed)
Pharmacy Antibiotic Note  Dana [name] is a 82 y.o. female admitted on 03/24/2016 with VDRF. Patient is on day 4 of vancomycin and cefepime for HCAP, and cultures remain negative. She is afebrile with improving renal function, however her wbc continues to trend up in the presence of steroids. As cultures are ngtd and MRSA PCR is negative, will de-escalate to cefepime monotherapy.  Plan: Continue cefepime 2g IV every 24 hours D/C vancomycin Monitor renal function and f/u cultures, LOT, and ability to de-escalate  Height: 4\' 7"  (139.7 cm) Weight: 110 lb 7.2 oz (50.1 kg) IBW/kg (Calculated) : 34  Temp (24hrs), Avg:98.8 F (37.1 C), Min:98.4 F (36.9 C), Max:99.1 F (37.3 C)   Recent Labs Lab 03/24/16 1817 03/24/16 1829 03/24/16 2213 03/25/16 0516 03/26/16 0706 03/26/16 2030 03/27/16 0314  WBC  --  13.7*  --  14.2* 18.4*  --  23.4*  CREATININE  --  0.95  --  0.93 0.81 0.81 0.80  LATICACIDVEN 2.5*  --  2.9* 1.9  --   --   --     Estimated Creatinine Clearance: 35.2 mL/min (by C-G formula based on SCr of 0.8 mg/dL).    No Known Allergies   Antimicrobials this admission:  Vancomycin 9/25>>9/27 Cefepime 9/25 >> Azithro 9/22 >>9/26 Rocephin ?9/22 >> 9/25  Microbiology results:  9/27 Strep pneumo: 9/25 Legionella: Neg 9/25 Strep pneumo: Cancelled - quantity not sufficient 9/25BCx: NGTD 9/25Sputum: rare gram negative rods 9/25MRSA PCR: Negative 9/22 blood cx from OSH - no growth x 48   Imaging: 9/28 CXR: Question alveolar edema versus pneumonia left lower lobe; both entities may exist concurrently.  Thank you for allowing pharmacy to be a part of this patient's care.  Alfredo BachJoseph Arminger, Cleotis NipperBS, PharmD Clinical Pharmacy Resident [phone] (Pager) 03/27/2016 11:33 AM

## 2016-03-27 NOTE — Progress Notes (Signed)
PCCM Attending Note: Patient seen after prolonged SBT on PS 0/5. TV >500cc. RR approximately 25. Patient appears comfortable. Requiring low dose pressors. EKG without obvious ischemia. Checking for cuff leak & if present extubating patient.   Donna ChristenJennings E. Jamison NeighborNestor, M.D. [facility] Pulmonary & Critical Care Pager:  [phone] After 3pm or if no response, call [phone] 11:59 AM 03/27/16

## 2016-03-27 NOTE — Progress Notes (Signed)
approximately 1/3 bottle of propofol wasted in sharps. Witnessed by Tollie EthNoah RN  Dana [name], Dana [name]

## 2016-03-27 NOTE — Progress Notes (Signed)
eLink Physician-Brief Progress Note Patient Name: Dana [name] DOB: 09/10/1932 MRN: [id number]   Date of Service  03/27/2016  HPI/Events of Note  Hypotension   eICU Interventions  Begin phenylephrine     Intervention Category Intermediate Interventions: Hypotension - evaluation and management  Merwyn KatosDavid B Jonne Rote 03/27/2016, 3:57 AM

## 2016-03-27 NOTE — Progress Notes (Signed)
eLink Physician-Brief Progress Note Patient Name: Dana [name] S Humphrey DOB: 06/20/1933 MRN: [id number]   Date of Service  03/27/2016  HPI/Events of Note  Recurrent AFRVR  eICU Interventions  Repeat amiodarone bolus 150 mg     Intervention Category Major Interventions: Arrhythmia - evaluation and management  Merwyn KatosDavid B Simonds 03/27/2016, 1:02 AM

## 2016-03-27 NOTE — Progress Notes (Signed)
PULMONARY / CRITICAL CARE MEDICINE   Name: Dana [name] MRN: [id number] DOB: [birth year]    ADMISSION DATE:  03/24/2016 CONSULTATION DATE:  9/25  REFERRING MD:  Boneta Lucks   CHIEF COMPLAINT:  Acute on chronic respiratory failure   HISTORY OF PRESENT ILLNESS:   82 year old female admitted to [facility] hospital on 9/22 w/ working dx of CAP, AECOPD and  Sepsis. She is on 2 liters Oxygen at baseline. She was treated in usual fashion which included: azith/roceph, systemic steroids, inhaled BDs and supplemental oxygen.  Over the course of her stay she required escalating supplemental oxygen going from high-flow to BIPAP d/t increasing WOB. On the am of 9/25 the pt was desaturating further and also demonstrated increased CO2 retention w/ PCO2 of 92. CXR obtained that am raising concern for increasing edema. Was given IV lasix, but ultimately intubated. Her abx were widened to include: vanc and cefepime on 9/25, azithro was continued. She was accepted at [facility] but no beds were available so she was transferred to [facility].   SUBJECTIVE: Patient developed hypotension overnight and A fib w/ RVR. Started on Neo-Synephrine and Amiodarone.   REVIEW OF SYSTEMS:  Unable to obtain as patient is currently intubated and sedated.  VITAL SIGNS: BP 91/60 (BP Location: Left Arm)   Pulse 83   Temp 98.9 F (37.2 C) (Oral)   Resp (!) 23   Ht 4\' 7"  (1.397 m)   Wt 110 lb 7.2 oz (50.1 kg)   SpO2 93%   BMI 25.67 kg/m     HEMODYNAMICS:    VENTILATOR SETTINGS: Vent Mode: PRVC FiO2 (%):  [30 %] 30 % Set Rate:  [15 bmp] 15 bmp Vt Set:  [310 mL] 310 mL PEEP:  [5 cmH20] 5 cmH20 Pressure Support:  [5 cmH20] 5 cmH20 Plateau Pressure:  [10 cmH20-17 cmH20] 16 cmH20  INTAKE / OUTPUT: I/O last 3 completed shifts: In: 2782.6 [I.V.:832.6; NG/GT:1400; IV Piggyback:550] Out: 1400 [Urine:1400]  PHYSICAL EXAMINATION: General:  Awake. No distress. No family at bedside.  Neuro:  Moving all 4 extremities. Sleeping until  awoken. Still grossly nonfocal.  HEENT:  ETT in place. No scleral icterus.  Cardiovascular:  Regular rhythm and rate. Sinus on telemetry. No edema.  Lungs:  Slight improvement in bilateral wheezing with only faint end-expiratory wheeze. Symmetric chest rise on ventilator.  Abdomen:  Soft. Nondistended. Normal bowel sounds.  Skin:  Warm and dry. No rash on exposed skin.   LABS:  BMET  Recent Labs Lab 03/26/16 0706 03/26/16 2030 03/27/16 0314  NA 144 141 138  K 4.0 4.4 4.2  CL 103 101 98*  CO2 33* 36* 31  BUN 26* 28* 26*  CREATININE 0.81 0.81 0.80  GLUCOSE 134* 157* 298*    Electrolytes  Recent Labs Lab 03/26/16 0706 03/26/16 2030 03/27/16 0314  CALCIUM 8.3* 8.5* 7.8*  MG 2.0 2.2 1.9  PHOS 2.7 2.8 1.9*    CBC  Recent Labs Lab 03/25/16 0516 03/26/16 0706 03/27/16 0314  WBC 14.2* 18.4* 23.4*  HGB 8.7* 8.8* 8.7*  HCT 29.8* 28.9* 28.5*  PLT 222 213 214    Coag's No results for input(s): APTT, INR in the last 168 hours.  Sepsis Markers  Recent Labs Lab 03/24/16 1817 03/24/16 1829 03/24/16 2213 03/25/16 0516 03/26/16 0706  LATICACIDVEN 2.5*  --  2.9* 1.9  --   PROCALCITON  --  0.14  --  0.11 0.12    ABG  Recent Labs Lab 03/24/16 1750 03/25/16 0332  PHART  7.390 7.364  PCO2ART 60.8* 49.2*  PO2ART 55.1* 149*    Liver Enzymes  Recent Labs Lab 03/24/16 1829 03/27/16 0314  AST 20  --   ALT 16  --   ALKPHOS 61  --   BILITOT 0.6  --   ALBUMIN 2.4* 1.8*    Cardiac Enzymes  Recent Labs Lab 03/25/16 0516 03/26/16 0706 03/26/16 2030  TROPONINI 0.06* 0.09* 0.07*    Glucose  Recent Labs Lab 03/26/16 1110 03/26/16 1600 03/26/16 2006 03/26/16 2314 03/27/16 0322 03/27/16 0719  GLUCAP 152* 132* 146* 168* 154* 139*    Imaging No results found.   STUDIES:  TTE 9/27:  LV normal in size w/ EF 65-70% & normal wall motion. Normal LV diastolic function. RV normal in size & function. No AR & mild AS. Mild MR & no MS. No pericardial  effusion.   MICROBIOLOGY: Blood Ctx (at [facility]) 9/24 >> Lower Resp Ctx (at [facility]) >> MRSA PCR 9/25:  Negative Blood Ctx x2 9/25 >> Tracheal Asp Ctx 9/25 >> Urine Legionella Ag 9/25:  Negative Urine Strep Ag 9/28 >>  ANTIBIOTICS: Rocephin 8/22 - 8/2 Vancomycin 9/22 - 9/28 Azithro 9/22 >> Cefepime 9/22 >>  SIGNIFICANT EVENTS: 9/22 - 9/25 admitted w/ working dx of AECOPD and CAP. Treated in usual fashion: ABX, systemic steroids, BDs, O2. Continued to decline clinically w/ worsening WOB, hypercapnia and possible pulmonary edema. Intubated and transferred to [facility] on  9/25 - Intubated & transferred to [facility] for PCCM management.   LINES/TUBES: OETT 7.5 9/25 >> OGT 9/25 >> Foley 9/25 >> PIV x2  ASSESSMENT / PLAN:  PULMONARY A: Acute on Chronic Hypoxic and Hypercarbic Respiratory Failure - Secondary to Severe CAP & COPD Exacerbation. Severe CAP Acute COPD Exacerbation  Tobacco Use Disorder  Small Bilateral Pleural Effusions - L>R  P:   Full Vent Support @ 8cc/kg IBW CXR & ABG intermittently SBT daily Duoneb q4hr Budesonide neb bid Solu-Medrol 20mg  IV q24 hr (changed 9/28) ID as below Nicotine Patch 21mg /24hr  CARDIOVASCULAR A:  Elevated Troponin I - Likely demand ishcemia. No wall motion on TTE. A fib - RVR overnight 9/27. Resolved w/ Amio.  Shock - Sepsis versus cardiac in etiology. H/O HTN  Possible Mild CHF  P:  Vitals per unit protocol Continuous Telemetry Monitoring Continue Amiodarone gtt Neo-Synephrine gtt to maintain MAP>65 mmHG  Cont ASA 81mg  daily Holding home HCTZ and Lisinopril  Holding on diuresis NS 250cc now bolus Checking EKG now Trending Troponin I q6hr Consider repeat TTE if Troponin I elevated  RENAL A:   Hypophosphatemia - Replacing. Hypernatremia - Resolved. Hypophosphoremia - Resolved. Metabolic Alkalosis - Resolved. Lactic Acidosis - Resolved.  P:   Monitoring UOP Trending electrolytes & renal function Replacing electrolytes  as indicated KVO IVF Magnesium Sulfate 1gm IV Phos-NAK 1packet VT x2  GASTROINTESTINAL A:   Protein-Calorie Malnutrition  P:   NPO Continuing Tube Feedings Pepcid IV q12hr  HEMATOLOGIC A:  Leukocytosis - Worsening. Likely due to steroids & sepsis. Anemia - No signs of active bleeding. Hgb stable.  P:  Trending cell counts daily w/ CBC Transfuse for Hgb <8.0 Heparin [place] q8hr SCDs  INFECTIOUS A:   Sepsis Severe CAP  P:   Empiric Cefepime, Vancomycin, & Azithromycin Day #7 D/C Vancomycin  Following Cultures to completion Trending Procalcitonin per algorithm Urine Strep Antigen pending  ENDOCRINE A:   Hyperglycemia - Mild. No h/o DM. Likely due to steroids.  P:   Accu-Checks q4hr SSI per Sensitive Algorithm  NEUROLOGIC A:   Sedation on Ventilator  P:   RASS goal: 0 to -1  Propofol gtt Fentanyl IV prn Pain WUA daily  FAMILY  - Updates: Daughter Liborio NixonJanice updated by Dr. Jamison NeighborNestor via phone 9/27.   - Inter-disciplinary family meet or Palliative Care meeting due by:  10/1  TODAY'S SUMMAR:  82 [facility]. [facility] w/ acute on chronic respiratory failure in setting of AECOPD and CAP. Course c/b troponin leak, and persistent/progressive respiratory failure ultimately leading to need for intubation. Improving with regards to ventilator requirement. Afib overnight concerning. Checking cardiac biomarkers & repeat EKG. Attempting to wean off pressors.   I have spent a total of 32 minutes of critical care time today caring for the patient and reviewing the patient's electronic medical record.  Donna ChristenJennings E. Jamison NeighborNestor, M.D. [facility] Pulmonary & Critical Care Pager:  [phone] After 3pm or if no response, call [phone] 10:24 AM 03/27/16

## 2016-03-27 NOTE — Procedures (Signed)
Extubation Procedure Note  Patient Details:   Name: Dana [name] DOB: 08/31/1932 MRN: [id number]   Airway Documentation:  Airway 7.5 mm (Active)  Secured at (cm) 21 cm 03/27/2016  7:50 AM  Measured From Lips 03/27/2016  7:50 AM  Secured Location Center 03/27/2016  7:50 AM  Secured By [employer] Tube Holder 03/27/2016  7:50 AM  Tube Holder Repositioned Yes 03/27/2016  7:50 AM  Cuff Pressure (cm H2O) 22 cm H2O 03/27/2016  4:12 AM  Site Condition Dry 03/27/2016  7:50 AM    Evaluation  O2 sats: stable throughout Complications: No apparent complications Patient did tolerate procedure well. Bilateral Breath Sounds: Clear, Diminished   Yes   Pt extubated per MD order.  Pt tolerated well.  Pt currently on 6L [place] with sats of 90-92.  Will wean as tolerated  Ronny FlurryMorgan B Waynesha Rammel 03/27/2016, 12:09 PM

## 2016-03-28 LAB — GLUCOSE, CAPILLARY
GLUCOSE-CAPILLARY: 85 mg/dL (ref 65–99)
GLUCOSE-CAPILLARY: 122 mg/dL — AB (ref 65–99)
Glucose-Capillary: 100 mg/dL — ABNORMAL HIGH (ref 65–99)
Glucose-Capillary: 117 mg/dL — ABNORMAL HIGH (ref 65–99)
Glucose-Capillary: 86 mg/dL (ref 65–99)
Glucose-Capillary: 93 mg/dL (ref 65–99)

## 2016-03-28 LAB — CBC WITH DIFFERENTIAL/PLATELET
BASOS ABS: 0 10*3/uL (ref 0.0–0.1)
Basophils Relative: 0 %
EOS ABS: 0.5 10*3/uL (ref 0.0–0.7)
Eosinophils Relative: 2 %
HCT: 30.6 % — ABNORMAL LOW (ref 36.0–46.0)
HEMOGLOBIN: 9.2 g/dL — AB (ref 12.0–15.0)
LYMPHS ABS: 3.4 10*3/uL (ref 0.7–4.0)
LYMPHS PCT: 15 %
MCH: 28.2 pg (ref 26.0–34.0)
MCHC: 30.1 g/dL (ref 30.0–36.0)
MCV: 93.9 fL (ref 78.0–100.0)
MONO ABS: 2.1 10*3/uL — AB (ref 0.1–1.0)
Monocytes Relative: 9 %
NEUTROS ABS: 16.9 10*3/uL — AB (ref 1.7–7.7)
Neutrophils Relative %: 74 %
Platelets: 229 10*3/uL (ref 150–400)
RBC: 3.26 MIL/uL — ABNORMAL LOW (ref 3.87–5.11)
RDW: 15.9 % — AB (ref 11.5–15.5)
WBC: 22.9 10*3/uL — ABNORMAL HIGH (ref 4.0–10.5)

## 2016-03-28 LAB — RENAL FUNCTION PANEL
ALBUMIN: 1.9 g/dL — AB (ref 3.5–5.0)
Anion gap: 4 — ABNORMAL LOW (ref 5–15)
BUN: 20 mg/dL (ref 6–20)
CALCIUM: 8.3 mg/dL — AB (ref 8.9–10.3)
CO2: 34 mmol/L — ABNORMAL HIGH (ref 22–32)
CREATININE: 0.78 mg/dL (ref 0.44–1.00)
Chloride: 102 mmol/L (ref 101–111)
GFR calc Af Amer: >60 mL/min (ref >60)
GFR calc non Af Amer: >60 mL/min (ref >60)
GLUCOSE: 96 mg/dL (ref 65–99)
PHOSPHORUS: 2.9 mg/dL (ref 2.5–4.6)
Potassium: 4.3 mmol/L (ref 3.5–5.1)
SODIUM: 140 mmol/L (ref 135–145)

## 2016-03-28 LAB — MAGNESIUM: Magnesium: 2.3 mg/dL (ref 1.7–2.4)

## 2016-03-28 LAB — TRIGLYCERIDES: TRIGLYCERIDES: 104 mg/dL (ref <150)

## 2016-03-28 NOTE — Progress Notes (Addendum)
PULMONARY / CRITICAL CARE MEDICINE   Name: Dana [name] MRN: [id number] DOB: 07/04/1932    ADMISSION DATE:  03/24/2016 CONSULTATION DATE:  9/25  REFERRING MD:  Boneta LucksHalpert   CHIEF COMPLAINT:  Acute on chronic respiratory failure   HISTORY OF PRESENT ILLNESS:   82 year old female admitted to [facility] on 9/22 w/ working dx of CAP, AECOPD and  Sepsis. She is on 2 liters Oxygen at baseline. She was treated in usual fashion which included: azith/roceph, systemic steroids, inhaled BDs and supplemental oxygen.  Over the course of her stay she required escalating supplemental oxygen going from high-flow to BIPAP d/t increasing WOB. On the am of 9/25 the pt was desaturating further and also demonstrated increased CO2 retention w/ PCO2 of 92. CXR obtained that am raising concern for increasing edema. Was given IV lasix, but ultimately intubated. Her abx were widened to include: vanc and cefepime on 9/25, azithro was continued. [facility] but no beds were available so she was [facility].   SUBJECTIVE: Extubated yesterday and off pressors since afternoon 9/28. No acute events overnight. Patient reports continued dyspnea. Didn't sleep much last night. No chest pain or pressure.  REVIEW OF SYSTEMS:  No fever or chills. No nausea or abdominal pain. No headache.   VITAL SIGNS: BP 113/68 (BP Location: Left Arm)   Pulse 71   Temp 98.8 F (37.1 C) (Oral)   Resp (!) 21   Ht 4\' 7"  (1.397 m)   Wt 113 lb 15.7 oz (51.7 kg)   SpO2 96%   BMI 26.49 kg/m     HEMODYNAMICS:    VENTILATOR SETTINGS: FiO2 (%):  [30 %] 30 %  INTAKE / OUTPUT: I/O last 3 completed shifts: In: 2309.3 [I.V.:1219.3; NG/GT:640; IV Piggyback:450] Out: 2675 [Urine:2675]  PHYSICAL EXAMINATION: General:  Awake. No distress. No family at bedside.  Neuro:  Moving all 4 extremities. Following commands. Answers questions. HEENT:  Moist mucus membranes. No scleral icterus or injection..  Cardiovascular:   Regular rhythm and rate. Sinus on telemetry. No edema.  Lungs:  Continued faint end-expiratory wheezing. Good aeration bilaterally. Normal work of breathing on [place] oxygen.  Abdomen:  Soft. Nondistended. Normal bowel sounds. Nontender. Skin:  Warm and dry. No rash on exposed skin.   LABS:  BMET  Recent Labs Lab 03/26/16 2030 03/27/16 0314 03/28/16 0142  NA 141 138 140  K 4.4 4.2 4.3  CL 101 98* 102  CO2 36* 31 34*  BUN 28* 26* 20  CREATININE 0.81 0.80 0.78  GLUCOSE 157* 298* 96    Electrolytes  Recent Labs Lab 03/26/16 2030 03/27/16 0314 03/28/16 0142  CALCIUM 8.5* 7.8* 8.3*  MG 2.2 1.9 2.3  PHOS 2.8 1.9* 2.9    CBC  Recent Labs Lab 03/26/16 0706 03/27/16 0314 03/28/16 0142  WBC 18.4* 23.4* 22.9*  HGB 8.8* 8.7* 9.2*  HCT 28.9* 28.5* 30.6*  PLT 213 214 229    Coag's No results for input(s): APTT, INR in the last 168 hours.  Sepsis Markers  Recent Labs Lab 03/24/16 1817 03/24/16 1829 03/24/16 2213 03/25/16 0516 03/26/16 0706  LATICACIDVEN 2.5*  --  2.9* 1.9  --   PROCALCITON  --  0.14  --  0.11 0.12    ABG  Recent Labs Lab 03/24/16 1750 03/25/16 0332  PHART 7.390 7.364  PCO2ART 60.8* 49.2*  PO2ART 55.1* 149*    Liver Enzymes  Recent Labs Lab 03/24/16 1829 03/27/16 0314 03/28/16 0142  AST  20  --   --   ALT 16  --   --   ALKPHOS 61  --   --   BILITOT 0.6  --   --   ALBUMIN 2.4* 1.8* 1.9*    Cardiac Enzymes  Recent Labs Lab 03/27/16 1143 03/27/16 1633 03/27/16 2241  TROPONINI 0.05* 0.05* 0.05*    Glucose  Recent Labs Lab 03/27/16 1142 03/27/16 1519 03/27/16 1916 03/28/16 0007 03/28/16 0318 03/28/16 0712  GLUCAP 158* 113* 111* 93 86 85    Imaging No results found.   STUDIES:  TTE 9/27:  LV normal in size w/ EF 65-70% & normal wall motion. Normal LV diastolic function. RV normal in size & function. No AR & mild AS. Mild MR & no MS. No pericardial effusion.   MICROBIOLOGY: Blood Ctx (at [facility]) 9/24  >> Lower Resp Ctx (at [facility]) >> MRSA PCR 9/25:  Negative Blood Ctx x2 9/25 >> Tracheal Asp Ctx 9/25 >> Urine Legionella Ag 9/25:  Negative Urine Strep Ag 9/28:  Negative   ANTIBIOTICS: Rocephin 8/22 - 8/2 Vancomycin 9/22 - 9/28 Azithro 9/22 >> Cefepime 9/22 >>  SIGNIFICANT EVENTS: 9/22 - 9/25 admitted w/ working dx of AECOPD and CAP. Treated in usual fashion: ABX, systemic steroids, BDs, O2. Continued to decline clinically w/ worsening WOB, hypercapnia and possible pulmonary edema. Intubated and transferred to [facility] on  9/25 - Intubated & transferred to [facility] for PCCM management.   LINES/TUBES: OETT 7.5 9/25 - 9/28 OGT 9/25 - 9/28 Foley 9/25 >> PIV x2  ASSESSMENT / PLAN:  PULMONARY A: Acute on Chronic Hypoxic and Hypercarbic Respiratory Failure - Secondary to Severe CAP & COPD Exacerbation. Extubated 9/28.  Severe CAP Acute COPD Exacerbation  Tobacco Use Disorder  Small Bilateral Pleural Effusions - L>R  P:   Incentive Spirometry for pulmonary toilette Duoneb q4hr Budesonide neb bid Solu-Medrol 20mg  IV q24 hr (changed 9/28) - Plan for 1 week taper. ID as below Nicotine Patch 21mg /24hr Needs outpatient PFTs and Follow-up with Pulmonary  CARDIOVASCULAR A:  Elevated Troponin I - Likely demand ishcemia. No wall motion on TTE.  A fib - RVR overnight 9/27. Resolved w/ Amio.  Shock - Sepsis versus cardiac in etiology. Resolved off sedation. H/O HTN  Possible Mild CHF  P:  Vitals per unit protocol Continuous Telemetry Monitoring Continue Amiodarone gtt while NPO Cont ASA 81mg  daily Holding home HCTZ and Lisinopril  Holding on diuresis  RENAL A:   Hypophosphatemia - Resolved. Hypernatremia - Resolved. Hypophosphoremia - Resolved. Metabolic Alkalosis - Resolved. Lactic Acidosis - Resolved.  P:   Monitoring UOP Trending electrolytes & renal function Replacing electrolytes as indicated KVO IVF Discontinue Foley  GASTROINTESTINAL A:   Protein-Calorie  Malnutrition  P:   NPO pending Speech Eval Pepcid IV q12hr  HEMATOLOGIC A:  Leukocytosis - Stable. Likely due to steroids & sepsis. Anemia - No signs of active bleeding. Hgb stable.  P:  Trending cell counts daily w/ CBC Transfuse for Hgb <8.0 Heparin Two Strike q8hr SCDs  INFECTIOUS A:   Sepsis Severe CAP  P:   Empiric Cefepime & Azithromycin Day #8 Following Cultures to completion Trending Procalcitonin per algorithm  ENDOCRINE A:   Hyperglycemia - Mild. No h/o DM. Likely due to steroids.  P:   Accu-Checks q4hr SSI per Sensitive Algorithm  NEUROLOGIC A:   No acute issues.  P:   Monitor closely.  Avoid sedating medications.   FAMILY  - Updates: Daughter Liborio Nixon updated by Dr. Jamison Neighbor via phone 9/27.   -  Inter-disciplinary family meet or Palliative Care meeting due by:  10/1  TODAY'S SUMMAR:  82 y.o. female admitted to [facility] hospital w/ acute on chronic respiratory failure in setting of AECOPD and CAP. Course c/b troponin leak, and persistent/progressive respiratory failure ultimately leading to need for intubation. Extubated 9/28. Successfully weaning oxygen requirement for respiratory failure. Speech evaluation pending to initiate diet. Continuing Amiodarone drip in setting of paroxysmal A fib. Given patients hemodynamic stability I am transferring her to a telemetry bed.  TRH will assume care & PCCM off as of 9/30.  Donna Christen Jamison Neighbor, M.D. [facility] Pulmonary & Critical Care Pager:  [phone] After 3pm or if no response, call [phone] 9:25 AM 03/28/16

## 2016-03-28 NOTE — Evaluation (Signed)
Clinical/Bedside Swallow Evaluation Patient Details  Name: Dana [name] MRN: [id number] Date of Birth: 07/17/1932  Today's Date: 03/28/2016 Time: SLP Start Time (ACUTE ONLY): 0930 SLP Stop Time (ACUTE ONLY): 0950 SLP Time Calculation (min) (ACUTE ONLY): 20 min  Past Medical History: No past medical history on file. Past Surgical History: No past surgical history on file. HPI:  82 year old female admitted to [facility] on 9/22 w/ working dx of CAP, AECOPD and Sepsis. She continued to decline with worsening WOB, hypercapnia,a nd possible pulmonary edema requiring intubation 9/25-9/28. She was [facility]. She is on 2 liters Oxygen at baseline.    Assessment / Plan / Recommendation Clinical Impression  Pt has clear vocal quality following extubation on previous date, but has immediate coughing following small spoonfuls of water. With minimal PO intake she has increase in RR to 30 and drop in SpO2 to 87%, concerning for poor airway protection and reduced ability to coordinate breath/swallow. Recommend to remain NPO for now. Will f/u on next date to assess for improvements in respirations and readiness for POs versus likely MBS.    Aspiration Risk  Moderate aspiration risk    Diet Recommendation NPO   Medication Administration: Via alternative means    Other  Recommendations Oral Care Recommendations: Oral care QID Other Recommendations: Have oral suction available   Follow up Recommendations  (tba)      Frequency and Duration min 2x/week  2 weeks       Prognosis Prognosis for Safe Diet Advancement: Good      Swallow Study   General HPI: 82 year old female admitted to [facility] on 9/22 w/ working dx of CAP, AECOPD and Sepsis. She continued to decline with worsening WOB, hypercapnia,a nd possible pulmonary edema requiring intubation 9/25-9/28. [facility]. She is on 2 liters Oxygen at baseline.  Type of Study: Bedside Swallow  Evaluation Previous Swallow Assessment: none in chart Diet Prior to this Study: NPO Temperature Spikes Noted: No Respiratory Status: Nasal cannula History of Recent Intubation: Yes Length of Intubations (days): 3 days Date extubated: 03/27/16 Behavior/Cognition: Alert;Cooperative;Other (Comment) (HOH) Oral Cavity Assessment: Within Functional Limits Oral Care Completed by SLP: No Oral Cavity - Dentition: Edentulous (does not have dentures) Patient Positioning: Upright in bed Baseline Vocal Quality: Normal Volitional Cough: Weak;Congested    Oral/Motor/Sensory Function Overall Oral Motor/Sensory Function:  (baseline tremor of head/jaw)   Ice Chips Ice chips: Impaired Presentation: Spoon Pharyngeal Phase Impairments: Suspected delayed Swallow   Thin Liquid Thin Liquid: Impaired Presentation: Spoon Pharyngeal  Phase Impairments: Suspected delayed Swallow;Cough - Immediate;Change in Vital Signs    Nectar Thick Nectar Thick Liquid: Not tested   Honey Thick Honey Thick Liquid: Not tested   Puree Puree: Impaired Presentation: Spoon Pharyngeal Phase Impairments: Suspected delayed Swallow;Change in Vital Signs   Solid   GO   Solid: Not tested        Dana [name], Dana [name] 03/28/2016,10:00 AM  Dana [name], M.A. CCC-SLP [phone]

## 2016-03-28 NOTE — Care Management Note (Signed)
Case Management Note  Patient Details  Name: Dana [name] MRN: [id number] Date of Birth: 09/19/1932  Subjective/Objective:        Pt admitted with resp failure- possibly secondary to aspiration            Action/Plan:  PTA pt was staying with 2 adult daughters, on home oxygen .  Pt extremely deconditioned - verified by bedside nurse.  CM requested PT eval once medically stable (pt desats into 80's on supplemental oxygen ) with slight movement.  CM will continue to follow for discharge needs.     Expected Discharge Date:                  Expected Discharge Plan:     In-House Referral:     Discharge planning Services  CM Consult  Post Acute Care Choice:    Choice offered to:     DME Arranged:    DME Agency:     HH Arranged:    HH Agency:     Status of Service:  In process, will continue to follow  If discussed at Long Length of Stay Meetings, dates discussed:    Additional Comments:  Cherylann ParrClaxton, Tere Mcconaughey S, RN 03/28/2016, 4:25 PM

## 2016-03-28 NOTE — Progress Notes (Signed)
Pt arrived to unit from 86M.  Telemetry applied and CCMD notified.  Pt oriented to room including call light and telephone.  Pt on 3L O2 satting 94%.  Pt denies sob at this time.  Will cont to monitor.

## 2016-03-29 ENCOUNTER — Inpatient Hospital Stay (HOSPITAL_COMMUNITY): Payer: Medicare Other

## 2016-03-29 LAB — RENAL FUNCTION PANEL
ANION GAP: 5 (ref 5–15)
Albumin: 2 g/dL — ABNORMAL LOW (ref 3.5–5.0)
BUN: 21 mg/dL — ABNORMAL HIGH (ref 6–20)
CHLORIDE: 102 mmol/L (ref 101–111)
CO2: 32 mmol/L (ref 22–32)
Calcium: 8.6 mg/dL — ABNORMAL LOW (ref 8.9–10.3)
Creatinine, Ser: 0.8 mg/dL (ref 0.44–1.00)
Glucose, Bld: 76 mg/dL (ref 65–99)
POTASSIUM: 4.8 mmol/L (ref 3.5–5.1)
Phosphorus: 3.4 mg/dL (ref 2.5–4.6)
Sodium: 139 mmol/L (ref 135–145)

## 2016-03-29 LAB — GLUCOSE, CAPILLARY
GLUCOSE-CAPILLARY: 133 mg/dL — AB (ref 65–99)
GLUCOSE-CAPILLARY: 186 mg/dL — AB (ref 65–99)
GLUCOSE-CAPILLARY: 187 mg/dL — AB (ref 65–99)
GLUCOSE-CAPILLARY: 77 mg/dL (ref 65–99)
GLUCOSE-CAPILLARY: 86 mg/dL (ref 65–99)
GLUCOSE-CAPILLARY: 89 mg/dL (ref 65–99)

## 2016-03-29 LAB — CBC WITH DIFFERENTIAL/PLATELET
BASOS ABS: 0 10*3/uL (ref 0.0–0.1)
BASOS PCT: 0 %
Eosinophils Absolute: 0.4 10*3/uL (ref 0.0–0.7)
Eosinophils Relative: 2 %
HEMATOCRIT: 30 % — AB (ref 36.0–46.0)
HEMOGLOBIN: 9.1 g/dL — AB (ref 12.0–15.0)
LYMPHS ABS: 2.2 10*3/uL (ref 0.7–4.0)
Lymphocytes Relative: 12 %
MCH: 28.4 pg (ref 26.0–34.0)
MCHC: 30.3 g/dL (ref 30.0–36.0)
MCV: 93.8 fL (ref 78.0–100.0)
MONOS PCT: 8 %
Monocytes Absolute: 1.5 10*3/uL — ABNORMAL HIGH (ref 0.1–1.0)
NEUTROS ABS: 14.5 10*3/uL — AB (ref 1.7–7.7)
Neutrophils Relative %: 78 %
Platelets: 219 10*3/uL (ref 150–400)
RBC: 3.2 MIL/uL — ABNORMAL LOW (ref 3.87–5.11)
RDW: 16.1 % — ABNORMAL HIGH (ref 11.5–15.5)
WBC: 18.6 10*3/uL — ABNORMAL HIGH (ref 4.0–10.5)

## 2016-03-29 LAB — CULTURE, BLOOD (ROUTINE X 2)
CULTURE: NO GROWTH
Culture: NO GROWTH

## 2016-03-29 LAB — MAGNESIUM: MAGNESIUM: 2.2 mg/dL (ref 1.7–2.4)

## 2016-03-29 MED ORDER — IPRATROPIUM-ALBUTEROL 0.5-2.5 (3) MG/3ML IN SOLN
3.0000 mL | Freq: Four times a day (QID) | RESPIRATORY_TRACT | Status: DC
  Administered 2016-03-29 – 2016-03-31 (×7): 3 mL via RESPIRATORY_TRACT
  Filled 2016-03-29 (×7): qty 3

## 2016-03-29 MED ORDER — AMIODARONE HCL 200 MG PO TABS
200.0000 mg | ORAL_TABLET | Freq: Every day | ORAL | Status: DC
  Administered 2016-03-29 – 2016-04-01 (×4): 200 mg via ORAL
  Filled 2016-03-29 (×4): qty 1

## 2016-03-29 MED ORDER — RESOURCE THICKENUP CLEAR PO POWD
ORAL | Status: DC | PRN
  Filled 2016-03-29: qty 125

## 2016-03-29 MED ORDER — ALBUTEROL SULFATE (2.5 MG/3ML) 0.083% IN NEBU: 2.5000 mg | INHALATION_SOLUTION | RESPIRATORY_TRACT | Status: DC | PRN

## 2016-03-29 MED ORDER — FUROSEMIDE 10 MG/ML IJ SOLN
20.0000 mg | Freq: Once | INTRAMUSCULAR | Status: AC
  Administered 2016-03-29: 20 mg via INTRAVENOUS
  Filled 2016-03-29: qty 2

## 2016-03-29 MED ORDER — DEXTROSE 5 % IV SOLN
INTRAVENOUS | Status: DC
  Administered 2016-03-29: 08:00:00 via INTRAVENOUS

## 2016-03-29 MED ORDER — METHYLPREDNISOLONE SODIUM SUCC 40 MG IJ SOLR
40.0000 mg | Freq: Four times a day (QID) | INTRAMUSCULAR | Status: DC
  Administered 2016-03-29 – 2016-03-30 (×4): 40 mg via INTRAVENOUS
  Filled 2016-03-29 (×4): qty 1

## 2016-03-29 MED ORDER — AMIODARONE HCL 200 MG PO TABS: 200.0000 mg | ORAL_TABLET | Freq: Two times a day (BID) | ORAL | Status: DC

## 2016-03-29 NOTE — Progress Notes (Signed)
Speech Language Pathology Treatment: Dysphagia  Patient Details Name: Dana [name] MRN: [id number] DOB: 10/30/1932 Today's Date: 03/29/2016 Time: [id number] SLP Time Calculation (min) (ACUTE ONLY): 15 min  Assessment / Plan / Recommendation Clinical Impression  Pt has less immediate coughing with PO intake today, but it remains difficult to discern if delayed, wet coughing is her baseline cough or if it is attributable to airway compromise. She still appears short of breath and has immediate inhalations post-swallow, which is concerning for her ability to coordinate breath/swallow. Recommend to proceed with MBS.   HPI HPI: 82 year old female admitted to [facility] on 9/22 w/ working dx of CAP, AECOPD and Sepsis. She continued to decline with worsening WOB, hypercapnia,a nd possible pulmonary edema requiring intubation 9/25-9/28. [facility] 9[facility]. She is on 2 liters Oxygen at baseline.       SLP Plan  MBS     Recommendations  Diet recommendations: NPO Medication Administration: Via alternative means                Oral Care Recommendations: Oral care QID Follow up Recommendations:  (tba) Plan: MBS       GO                Maxcine Hamaiewonsky, Moriah Loughry 03/29/2016, 8:37 AM  Maxcine HamLaura Paiewonsky, M.A. CCC-SLP [phone]

## 2016-03-29 NOTE — Progress Notes (Signed)
MBSS complete. Full report located under chart review in imaging section.  Willadean Guyton Paiewonsky, M.A. CCC-SLP [phone]  

## 2016-03-29 NOTE — Progress Notes (Signed)
PROGRESS NOTE    Dana [name]  ZOX:[id number] DOB: Nov 14, 1932 DOA: 03/24/2016 PCP: No primary care provider on file.   Brief Narrative: 82 year old female admitted to [facility] on 9/22 w/ working dx of CAP, AECOPD and  Sepsis. She is on 2 liters Oxygen at baseline. She was treated in usual fashion which included: azith/roceph, systemic steroids, inhaled BDs and supplemental oxygen.  Over the course of her stay she required escalating supplemental oxygen going from high-flow to BIPAP d/t increasing WOB. On the am of 9/25 the pt was desaturating further and also demonstrated increased CO2 retention w/ PCO2 of 92. CXR obtained that am raising concern for increasing edema. Was given IV lasix, but ultimately intubated. Her abx were widened to include: vanc and cefepime on 9/25, azithro was continued. She was accepted at [facility] but no beds were available so she was transferred to [facility].    Assessment & Plan:   Active Problems:   Respiratory failure (HCC)   CAP (community acquired pneumonia)   Demand ischemia (HCC)  1-Acute on Chronic Hypoxic and Hypercarbic Respiratory Failure - Secondary to Severe CAP & COPD Exacerbation. Extubated 9/28.  Severe CAP Acute COPD Exacerbation  Tobacco Use Disorder  Small Bilateral Pleural Effusions - L>R Patient still requiring 4 to 5 L of oxygen.  Has bilateral wheezing on exam, will increase dose of solumedrol.  Continue with schedule nebulizer treatments.  Incentive Spirometry for pulmonary toilette Repeat chest x ray, if pulmonary edema will need IV lasix.   2-Severe CAP  A fib - RVR overnight 9/27. Resolved w/ Amio.  Check EKG.  When tolerating diet , will change amio to oral.   Leukocytosis;  In setting of infection and steroids.  Continue with IV antibiotics.   Protein-Calorie Malnutrition, nutrition;  Awaiting swallow evaluation.   Elevated Troponin I - Likely demand ishcemia. No wall motion on TTE.  Shock - Sepsis versus cardiac in  etiology. Resolved off sedation.   DVT prophylaxis: heparin.  Code Status; full code.  Family Communication: care discussed with patient  Disposition Plan: to be determine  Consultants:   CCM admitted patient   Procedures:  ECHO; Left ventricle: The cavity size was normal. There was mild   concentric hypertrophy. Systolic function was vigorous. The   estimated ejection fraction was in the range of 65% to 70%. Wall   motion was normal; there were no regional wall motion   abnormalities. Left ventricular diastolic function parameters    were normal  Antimicrobials:   Cefepime 9-25  SIGNIFICANT EVENTS: 9/22 - 9/25 admitted w/ working dx of AECOPD and CAP. Treated in usual fashion: ABX, systemic steroids, BDs, O2. Continued to decline clinically w/ worsening WOB, hypercapnia and possible pulmonary edema. Intubated and transferred to [facility] on  9/25 - Intubated & transferred to [facility] for PCCM management.    MICROBIOLOGY: Blood Ctx (at [facility]) 9/24 >> Lower Resp Ctx (at [facility]) >> MRSA PCR 9/25:  Negative Blood Ctx x2 9/25 >>no growth to date.  Tracheal Asp Ctx 9/25 >>no growth to date  Urine Legionella Ag 9/25:  Negative Urine Strep Ag 9/28:  Negative   Subjective: Patient with essential tremors of head and upper body.  Relates breathing better.  Still with cough.    Objective: Vitals:   03/28/16 1756 03/28/16 2101 03/29/16 0300 03/29/16 0909  BP: 131/60 129/69 104/66   Pulse: 72 69 63   Resp: (!) 22 19 18    Temp: 98.3 F (36.8 C) 98.4 F (36.9  C) 98.4 F (36.9 C)   TempSrc: Oral Oral Oral   SpO2: 96% 98% 99% (!) 88%  Weight:   55.8 kg (123 lb)   Height:        Intake/Output Summary (Last 24 hours) at 03/29/16 1115 Last data filed at 03/29/16 0819  Gross per 24 hour  Intake            100.2 ml  Output              700 ml  Net           -599.8 ml   Filed Weights   03/27/16 0500 03/28/16 0500 03/29/16 0300  Weight: 50.1 kg (110 lb 7.2 oz) 51.7 kg (113 lb  15.7 oz) 55.8 kg (123 lb)    Examination:  General exam: essential tremors , constant of head, upper body.  Respiratory system: Respiratory effort normal. Bilateral wheezing  Cardiovascular system: S1 & S2 heard, RRR. No JVD, murmurs, rubs, gallops or clicks. No pedal edema. Gastrointestinal system: Abdomen is nondistended, soft and nontender. No organomegaly or masses felt. Normal bowel sounds heard. Central nervous system: Alert and oriented. No focal neurological deficits. Extremities: Symmetric 5 x 5 power. Skin: No rashes, lesions or ulcers Psychiatry: Judgement and insight appear normal. Mood & affect appropriate.     Data Reviewed: I have personally reviewed following labs and imaging studies  CBC:  Recent Labs Lab 03/24/16 1829 03/25/16 0516 03/26/16 0706 03/27/16 0314 03/28/16 0142 03/29/16 0406  WBC 13.7* 14.2* 18.4* 23.4* 22.9* 18.6*  NEUTROABS 12.7*  --  14.8* 19.4* 16.9* 14.5*  HGB 9.7* 8.7* 8.8* 8.7* 9.2* 9.1*  HCT 33.4* 29.8* 28.9* 28.5* 30.6* 30.0*  MCV 98.8 96.8 94.1 93.1 93.9 93.8  PLT 252 222 213 214 229 219   Basic Metabolic Panel:  Recent Labs Lab 03/26/16 0706 03/26/16 2030 03/27/16 0314 03/28/16 0142 03/29/16 0406  NA 144 141 138 140 139  K 4.0 4.4 4.2 4.3 4.8  CL 103 101 98* 102 102  CO2 33* 36* 31 34* 32  GLUCOSE 134* 157* 298* 96 76  BUN 26* 28* 26* 20 21*  CREATININE 0.81 0.81 0.80 0.78 0.80  CALCIUM 8.3* 8.5* 7.8* 8.3* 8.6*  MG 2.0 2.2 1.9 2.3 2.2  PHOS 2.7 2.8 1.9* 2.9 3.4   GFR: Estimated Creatinine Clearance: 37.2 mL/min (by C-G formula based on SCr of 0.8 mg/dL). Liver Function Tests:  Recent Labs Lab 03/24/16 1829 03/27/16 0314 03/28/16 0142 03/29/16 0406  AST 20  --   --   --   ALT 16  --   --   --   ALKPHOS 61  --   --   --   BILITOT 0.6  --   --   --   PROT 5.7*  --   --   --   ALBUMIN 2.4* 1.8* 1.9* 2.0*   No results for input(s): LIPASE, AMYLASE in the last 168 hours. No results for input(s): AMMONIA in the  last 168 hours. Coagulation Profile: No results for input(s): INR, PROTIME in the last 168 hours. Cardiac Enzymes:  Recent Labs Lab 03/26/16 0706 03/26/16 2030 03/27/16 1143 03/27/16 1633 03/27/16 2241  TROPONINI 0.09* 0.07* 0.05* 0.05* 0.05*   BNP (last 3 results) No results for input(s): PROBNP in the last 8760 hours. HbA1C: No results for input(s): HGBA1C in the last 72 hours. CBG:  Recent Labs Lab 03/28/16 1544 03/28/16 2110 03/29/16 0003 03/29/16 0427 03/29/16 0818  GLUCAP 117* 100* 89  77 86   Lipid Profile:  Recent Labs  03/27/16 0314 03/28/16 0142  TRIG 100 104   Thyroid Function Tests:  Recent Labs  03/27/16 0314  TSH 0.648   Anemia Panel: No results for input(s): VITAMINB12, FOLATE, FERRITIN, TIBC, IRON, RETICCTPCT in the last 72 hours. Sepsis Labs:  Recent Labs Lab 03/24/16 1817 03/24/16 1829 03/24/16 2213 03/25/16 0516 03/26/16 0706  PROCALCITON  --  0.14  --  0.11 0.12  LATICACIDVEN 2.5*  --  2.9* 1.9  --     Recent Results (from the past 240 hour(s))  MRSA PCR Screening     Status: None   Collection Time: 03/24/16  4:31 PM  Result Value Ref Range Status   MRSA by PCR NEGATIVE NEGATIVE Final    Comment:        The GeneXpert MRSA Assay (FDA approved for NASAL specimens only), is one component of a comprehensive MRSA colonization surveillance program. It is not intended to diagnose MRSA infection nor to guide or monitor treatment for MRSA infections.   Culture, respiratory (tracheal aspirate)     Status: None   Collection Time: 03/24/16  4:46 PM  Result Value Ref Range Status   Specimen Description TRACHEAL ASPIRATE  Final   Special Requests NONE  Final   Gram Stain   Final    MODERATE WBC PRESENT, PREDOMINANTLY PMN RARE GRAM NEGATIVE RODS    Culture NO GROWTH 2 DAYS  Final   Report Status 03/26/2016 FINAL  Final  Culture, blood (routine x 2)     Status: None (Preliminary result)   Collection Time: 03/24/16  6:17 PM    Result Value Ref Range Status   Specimen Description BLOOD RIGHT HAND  Final   Special Requests IN PEDIATRIC BOTTLE  1CC  Final   Culture NO GROWTH 4 DAYS  Final   Report Status PENDING  Incomplete  Culture, blood (routine x 2)     Status: None (Preliminary result)   Collection Time: 03/24/16  6:28 PM  Result Value Ref Range Status   Specimen Description BLOOD LEFT HAND  Final   Special Requests IN PEDIATRIC BOTTLE  1CC  Final   Culture NO GROWTH 4 DAYS  Final   Report Status PENDING  Incomplete         Radiology Studies: No results found.      Scheduled Meds: . aspirin  81 mg Oral Daily  . budesonide (PULMICORT) nebulizer solution  0.5 mg Nebulization BID  . ceFEPime (MAXIPIME) IV  2 g Intravenous Q24H  . famotidine (PEPCID) IV  20 mg Intravenous Q24H  . heparin  5,000 Units Subcutaneous Q8H  . insulin aspart  0-9 Units Subcutaneous Q4H  . ipratropium-albuterol  3 mL Nebulization QID  . mouth rinse  15 mL Mouth Rinse BID  . methylPREDNISolone (SOLU-MEDROL) injection  40 mg Intravenous Q6H  . nicotine  21 mg Transdermal Daily   Continuous Infusions: . amiodarone 30 mg/hr (03/29/16 0622)  . dextrose 50 mL/hr at 03/29/16 0808  . feeding supplement (VITAL AF 1.2 CAL) 1,000 mL (03/27/16 1000)     LOS: 5 days    Time spent: 35 minutes.     Alba Coryegalado, Santa Abdelrahman A, MD [facility] Pager [phone]  If 7PM-7AM, please contact night-coverage [email or web address] Password [email or web address] 03/29/2016, 11:15 AM

## 2016-03-29 NOTE — Progress Notes (Signed)
Physical Therapy Evaluation Patient Details Name: Dana [name] MRN: [id number] DOB: 1932-11-29 Today's Date: 03/29/2016   History of Present Illness  82 yo female, admitted with Pneumonia and sepsis on top of COPD, ultimately intubated on 9/26, extubated 03/27/16.  On 2L O2 at home.  Clinical Impression  Patient on 5L O2, maintaining saturation 87-93% during PT evaluation.  For today's evaluation only sat up in bedside chair, with rest/monitoring interval at edge of bed for multiple line management.  Patient reported feeling OK with positioning.  Overall needed time and MOD assist with transfer.  Reviewed pursed lip breathing technique.  Patient tolerating mild activity, conservative overall approach, may need extended time to recover in rehab center, based on medical management as well.  Patient appears motivated to get moving again, and will benefit from continuation of skilled PT services to increase strength, endurance, and function.    Follow Up Recommendations SNF (Likely, still to be determined.)    Equipment Recommendations  None recommended by PT    Recommendations for Other Services Speech consult     Precautions / Restrictions Precautions Precautions: Fall Restrictions Weight Bearing Restrictions: No      Mobility  Bed Mobility Overal bed mobility: Needs Assistance Bed Mobility: Supine to Sit     Supine to sit: Min guard;Supervision     General bed mobility comments: Increased time, multiple lines.  Transfers Overall transfer level: Needs assistance Equipment used: 1 person hand held assist Transfers: Sit to/from [employer] Sit to Stand: Mod assist   Squat pivot transfers: Mod assist     General transfer comment: To bedside chair.  Ambulation/Gait Ambulation/Gait assistance:  (Not assessed today, monitoring O2. ) Ambulation Distance (Feet): 0 Feet            Stairs            Wheelchair Mobility    Modified Rankin  (Stroke Patients Only)       Balance Overall balance assessment: Needs assistance Sitting-balance support: Feet supported;Single extremity supported Sitting balance-Leahy Scale: Fair     Standing balance support: Bilateral upper extremity supported Standing balance-Leahy Scale: Poor                               Pertinent Vitals/Pain Pain Assessment: Faces Faces Pain Scale: No hurt    Home Living Family/patient expects to be discharged to:: Unsure                 Additional Comments: May require rehab stay prior to return home. Patient reports having family to assist at home.    Prior Function Level of Independence: Independent         Comments: Household level mobility without device, on O2, per patient.     Hand Dominance        Extremity/Trunk Assessment   Upper Extremity Assessment: Overall WFL for tasks assessed           Lower Extremity Assessment: Overall WFL for tasks assessed;Generalized weakness         Communication   Communication: HOH (Very)  Cognition Arousal/Alertness: Awake/alert Behavior During Therapy: WFL for tasks assessed/performed Overall Cognitive Status: Within Functional Limits for tasks assessed                      General Comments General comments (skin integrity, edema, etc.): Maintained O2 monitoring, 87-93% range maintained.    Exercises     Assessment/Plan  PT Assessment Patient needs continued PT services  PT Problem List Decreased strength;Decreased activity tolerance;Decreased mobility;Cardiopulmonary status limiting activity          PT Treatment Interventions DME instruction;Gait training;Functional mobility training;Therapeutic activities;Therapeutic exercise;Patient/family education    PT Goals (Current goals can be found in the Care Plan section)  Acute Rehab PT Goals Patient Stated Goal: Get better. PT Goal Formulation: With patient Time For Goal Achievement:  04/12/16 Potential to Achieve Goals: Good    Frequency Min 3X/week   Barriers to discharge        Co-evaluation               End of Session Equipment Utilized During Treatment: Gait belt;Oxygen Activity Tolerance: Patient tolerated treatment well Patient left: in chair;with call bell/phone within reach Nurse Communication: Mobility status (Communicated to Nurse and Nurse assistant.)         Time: [id number] PT Time Calculation (min) (ACUTE ONLY): 25 min   Charges:   PT Evaluation $PT Eval Moderate Complexity: 1 Procedure     PT G Codes:        Aldena Worm L 03/29/2016, 11:50 AM

## 2016-03-30 ENCOUNTER — Encounter (HOSPITAL_COMMUNITY): Payer: Self-pay | Admitting: *Deleted

## 2016-03-30 LAB — RENAL FUNCTION PANEL
ANION GAP: 5 (ref 5–15)
Albumin: 2.2 g/dL — ABNORMAL LOW (ref 3.5–5.0)
BUN: 24 mg/dL — ABNORMAL HIGH (ref 6–20)
CALCIUM: 8.4 mg/dL — AB (ref 8.9–10.3)
CO2: 31 mmol/L (ref 22–32)
Chloride: 99 mmol/L — ABNORMAL LOW (ref 101–111)
Creatinine, Ser: 0.89 mg/dL (ref 0.44–1.00)
GFR calc Af Amer: >60 mL/min (ref >60)
GFR calc non Af Amer: 59 mL/min — ABNORMAL LOW (ref >60)
GLUCOSE: 78 mg/dL (ref 65–99)
POTASSIUM: 5.7 mmol/L — AB (ref 3.5–5.1)
Phosphorus: 3.3 mg/dL (ref 2.5–4.6)
SODIUM: 135 mmol/L (ref 135–145)

## 2016-03-30 LAB — CBC WITH DIFFERENTIAL/PLATELET
BASOS ABS: 0 10*3/uL (ref 0.0–0.1)
Basophils Relative: 0 %
EOS ABS: 0 10*3/uL (ref 0.0–0.7)
Eosinophils Relative: 0 %
HCT: 29.8 % — ABNORMAL LOW (ref 36.0–46.0)
HEMOGLOBIN: 9.4 g/dL — AB (ref 12.0–15.0)
LYMPHS ABS: 1 10*3/uL (ref 0.7–4.0)
Lymphocytes Relative: 5 %
MCH: 29 pg (ref 26.0–34.0)
MCHC: 31.5 g/dL (ref 30.0–36.0)
MCV: 92 fL (ref 78.0–100.0)
MONO ABS: 1.2 10*3/uL — AB (ref 0.1–1.0)
MONOS PCT: 6 %
NEUTROS ABS: 17.7 10*3/uL — AB (ref 1.7–7.7)
Neutrophils Relative %: 89 %
Platelets: 243 10*3/uL (ref 150–400)
RBC: 3.24 MIL/uL — ABNORMAL LOW (ref 3.87–5.11)
RDW: 15.9 % — AB (ref 11.5–15.5)
WBC: 19.9 10*3/uL — ABNORMAL HIGH (ref 4.0–10.5)

## 2016-03-30 LAB — BASIC METABOLIC PANEL
Anion gap: 6 (ref 5–15)
BUN: 22 mg/dL — ABNORMAL HIGH (ref 6–20)
CALCIUM: 9 mg/dL (ref 8.9–10.3)
CHLORIDE: 97 mmol/L — AB (ref 101–111)
CO2: 36 mmol/L — ABNORMAL HIGH (ref 22–32)
CREATININE: 0.89 mg/dL (ref 0.44–1.00)
GFR, EST NON AFRICAN AMERICAN: 59 mL/min — AB (ref >60)
Glucose, Bld: 128 mg/dL — ABNORMAL HIGH (ref 65–99)
Potassium: 4.6 mmol/L (ref 3.5–5.1)
SODIUM: 139 mmol/L (ref 135–145)

## 2016-03-30 LAB — GLUCOSE, CAPILLARY
GLUCOSE-CAPILLARY: 95 mg/dL (ref 65–99)
GLUCOSE-CAPILLARY: 116 mg/dL — AB (ref 65–99)
GLUCOSE-CAPILLARY: 135 mg/dL — AB (ref 65–99)
GLUCOSE-CAPILLARY: 214 mg/dL — AB (ref 65–99)
Glucose-Capillary: 123 mg/dL — ABNORMAL HIGH (ref 65–99)

## 2016-03-30 LAB — MAGNESIUM: Magnesium: 2.2 mg/dL (ref 1.7–2.4)

## 2016-03-30 MED ORDER — INSULIN ASPART 100 UNIT/ML ~~LOC~~ SOLN
0.0000 [IU] | Freq: Three times a day (TID) | SUBCUTANEOUS | Status: DC
  Administered 2016-03-31 (×2): 1 [IU] via SUBCUTANEOUS

## 2016-03-30 MED ORDER — METHYLPREDNISOLONE SODIUM SUCC 40 MG IJ SOLR
40.0000 mg | Freq: Two times a day (BID) | INTRAMUSCULAR | Status: DC
  Administered 2016-03-31: 40 mg via INTRAVENOUS
  Filled 2016-03-30: qty 1

## 2016-03-30 MED ORDER — POLYETHYLENE GLYCOL 3350 17 G PO PACK
17.0000 g | PACK | Freq: Every day | ORAL | Status: DC
Start: 2016-03-30 — End: 2016-04-01
  Administered 2016-04-01: 17 g via ORAL
  Filled 2016-03-30 (×2): qty 1

## 2016-03-30 MED ORDER — SENNOSIDES-DOCUSATE SODIUM 8.6-50 MG PO TABS
1.0000 | ORAL_TABLET | Freq: Two times a day (BID) | ORAL | Status: DC
  Administered 2016-04-01: 1 via ORAL
  Filled 2016-03-30 (×3): qty 1

## 2016-03-30 MED ORDER — FLEET ENEMA 7-19 GM/118ML RE ENEM: 1.0000 | ENEMA | Freq: Once | RECTAL | Status: DC

## 2016-03-30 MED ORDER — FAMOTIDINE 20 MG PO TABS
20.0000 mg | ORAL_TABLET | Freq: Every day | ORAL | Status: DC
  Administered 2016-03-30 – 2016-03-31 (×2): 20 mg via ORAL
  Filled 2016-03-30 (×2): qty 1

## 2016-03-30 NOTE — Progress Notes (Signed)
PROGRESS NOTE    Dana [name]  ZDG:[id number] DOB: 03-Feb-1933 DOA: 03/24/2016 PCP: No primary care provider on file.   Brief Narrative: 82 year old female admitted to [facility] on 9/22 w/ working dx of CAP, AECOPD and  Sepsis. She is on 2 liters Oxygen at baseline. She was treated in usual fashion which included: azith/roceph, systemic steroids, inhaled BDs and supplemental oxygen.  Over the course of her stay she required escalating supplemental oxygen going from high-flow to BIPAP d/t increasing WOB. On the am of 9/25 the pt was desaturating further and also demonstrated increased CO2 retention w/ PCO2 of 92. CXR obtained that am raising concern for increasing edema. Was given IV lasix, but ultimately intubated. Her abx were widened to include: vanc and cefepime on 9/25, azithro was continued. She was accepted at [facility] but no beds were available so she was transferred to [facility].    Assessment & Plan:   Active Problems:   Respiratory failure (HCC)   CAP (community acquired pneumonia)   Demand ischemia (HCC)  1-Acute on Chronic Hypoxic and Hypercarbic Respiratory Failure - Secondary to Severe CAP & COPD -Exacerbation. Extubated 9/28.  Severe CAP Acute COPD Exacerbation  -Tobacco Use Disorder  -Small Bilateral Pleural Effusions - L>R -Patient still requiring 4 to 5 L of oxygen.  -Continue with schedule nebulizer treatments.  -Incentive Spirometry for pulmonary toilette -Repeated chest x ray 10-30: Stable small pleural effusions at each lung base, with probable adjacent atelectasis. Stable interstitial prominence bilaterally, likely mild interstitial edema superimposed on chronic interstitial lung disease. -received IV lasix 09-30, hold on lasix today.  -Lung exam with less wheezing, taper solumedrol.   2-Severe CAP Continue with cefepime.   A fib - RVR overnight 9/27. Resolved w/ Amio.  EKG. Now sinus.  Now on oral amiodarone, need taper off out patient.   Leukocytosis;  In  setting of infection and steroids.  Continue with IV antibiotics.   Protein-Calorie Malnutrition, nutrition;  Started on dysphagia 2 diet   Elevated Troponin I - Likely demand ishcemia. No wall motion on TTE.  Shock - Sepsis versus cardiac in etiology. Resolved off sedation.   DVT prophylaxis: heparin.  Code Status; full code.  Family Communication: care discussed with patient  Disposition Plan: to be determine  Consultants:   CCM admitted patient   Procedures:  ECHO; Left ventricle: The cavity size was normal. There was mild   concentric hypertrophy. Systolic function was vigorous. The   estimated ejection fraction was in the range of 65% to 70%. Wall   motion was normal; there were no regional wall motion   abnormalities. Left ventricular diastolic function parameters    were normal  Antimicrobials:   Cefepime 9-25  SIGNIFICANT EVENTS: 9/22 - 9/25 admitted w/ working dx of AECOPD and CAP. Treated in usual fashion: ABX, systemic steroids, BDs, O2. Continued to decline clinically w/ worsening WOB, hypercapnia and possible pulmonary edema. Intubated and transferred to [facility] on  9/25 - Intubated & transferred to [facility] for PCCM management.    MICROBIOLOGY: Blood Ctx (at [facility]) 9/24 >> Lower Resp Ctx (at [facility]) >> MRSA PCR 9/25:  Negative Blood Ctx x2 9/25 >>no growth to date.  Tracheal Asp Ctx 9/25 >>no growth to date  Urine Legionella Ag 9/25:  Negative Urine Strep Ag 9/28:  Negative   Subjective: She is feeling better, breathing better.  No new complaints.    Objective: Vitals:   03/30/16 0517 03/30/16 0902 03/30/16 1244 03/30/16 1314  BP: 130/61  127/84  Pulse: 65 71 78 73  Resp: 18 16 20    Temp: 97.9 F (36.6 C)   97.6 F (36.4 C)  TempSrc: Oral   Oral  SpO2: 99% 90% 92% 98%  Weight: 53.7 kg (118 lb 6.4 oz)     Height:        Intake/Output Summary (Last 24 hours) at 03/30/16 1418 Last data filed at 03/30/16 1300  Gross per 24 hour  Intake               480 ml  Output              550 ml  Net              -70 ml   Filed Weights   03/28/16 0500 03/29/16 0300 03/30/16 0517  Weight: 51.7 kg (113 lb 15.7 oz) 55.8 kg (123 lb) 53.7 kg (118 lb 6.4 oz)    Examination:  General exam: essential tremors , constant of head, upper body.  Respiratory system: Respiratory effort normal. No significant wheezing on lung exam.  Cardiovascular system: S1 & S2 heard, RRR. No JVD, murmurs, rubs, gallops or clicks. No pedal edema. Gastrointestinal system: Abdomen is nondistended, soft and nontender. No organomegaly or masses felt. Normal bowel sounds heard. Central nervous system: Alert and oriented. No focal neurological deficits. Extremities: Symmetric 5 x 5 power. Skin: No rashes, lesions or ulcers     Data Reviewed: I have personally reviewed following labs and imaging studies  CBC:  Recent Labs Lab 03/26/16 0706 03/27/16 0314 03/28/16 0142 03/29/16 0406 03/30/16 0119  WBC 18.4* 23.4* 22.9* 18.6* 19.9*  NEUTROABS 14.8* 19.4* 16.9* 14.5* 17.7*  HGB 8.8* 8.7* 9.2* 9.1* 9.4*  HCT 28.9* 28.5* 30.6* 30.0* 29.8*  MCV 94.1 93.1 93.9 93.8 92.0  PLT 213 214 229 219 243   Basic Metabolic Panel:  Recent Labs Lab 03/26/16 2030 03/27/16 0314 03/28/16 0142 03/29/16 0406 03/30/16 0119 03/30/16 0914  NA 141 138 140 139 135 139  K 4.4 4.2 4.3 4.8 5.7* 4.6  CL 101 98* 102 102 99* 97*  CO2 36* 31 34* 32 31 36*  GLUCOSE 157* 298* 96 76 78 128*  BUN 28* 26* 20 21* 24* 22*  CREATININE 0.81 0.80 0.78 0.80 0.89 0.89  CALCIUM 8.5* 7.8* 8.3* 8.6* 8.4* 9.0  MG 2.2 1.9 2.3 2.2 2.2  --   PHOS 2.8 1.9* 2.9 3.4 3.3  --    GFR: Estimated Creatinine Clearance: 32.8 mL/min (by C-G formula based on SCr of 0.89 mg/dL). Liver Function Tests:  Recent Labs Lab 03/24/16 1829 03/27/16 0314 03/28/16 0142 03/29/16 0406 03/30/16 0119  AST 20  --   --   --   --   ALT 16  --   --   --   --   ALKPHOS 61  --   --   --   --   BILITOT 0.6  --   --   --    --   PROT 5.7*  --   --   --   --   ALBUMIN 2.4* 1.8* 1.9* 2.0* 2.2*   No results for input(s): LIPASE, AMYLASE in the last 168 hours. No results for input(s): AMMONIA in the last 168 hours. Coagulation Profile: No results for input(s): INR, PROTIME in the last 168 hours. Cardiac Enzymes:  Recent Labs Lab 03/26/16 0706 03/26/16 2030 03/27/16 1143 03/27/16 1633 03/27/16 2241  TROPONINI 0.09* 0.07* 0.05* 0.05* 0.05*   BNP (last  3 results) No results for input(s): PROBNP in the last 8760 hours. HbA1C: No results for input(s): HGBA1C in the last 72 hours. CBG:  Recent Labs Lab 03/29/16 1602 03/29/16 2030 03/30/16 0520 03/30/16 0830 03/30/16 1145  GLUCAP 186* 187* 95 116* 135*   Lipid Profile:  Recent Labs  03/28/16 0142  TRIG 104   Thyroid Function Tests: No results for input(s): TSH, T4TOTAL, FREET4, T3FREE, THYROIDAB in the last 72 hours. Anemia Panel: No results for input(s): VITAMINB12, FOLATE, FERRITIN, TIBC, IRON, RETICCTPCT in the last 72 hours. Sepsis Labs:  Recent Labs Lab 03/24/16 1817 03/24/16 1829 03/24/16 2213 03/25/16 0516 03/26/16 0706  PROCALCITON  --  0.14  --  0.11 0.12  LATICACIDVEN 2.5*  --  2.9* 1.9  --     Recent Results (from the past 240 hour(s))  MRSA PCR Screening     Status: None   Collection Time: 03/24/16  4:31 PM  Result Value Ref Range Status   MRSA by PCR NEGATIVE NEGATIVE Final    Comment:        The GeneXpert MRSA Assay (FDA approved for NASAL specimens only), is one component of a comprehensive MRSA colonization surveillance program. It is not intended to diagnose MRSA infection nor to guide or monitor treatment for MRSA infections.   Culture, respiratory (tracheal aspirate)     Status: None   Collection Time: 03/24/16  4:46 PM  Result Value Ref Range Status   Specimen Description TRACHEAL ASPIRATE  Final   Special Requests NONE  Final   Gram Stain   Final    MODERATE WBC PRESENT, PREDOMINANTLY PMN RARE  GRAM NEGATIVE RODS    Culture NO GROWTH 2 DAYS  Final   Report Status 03/26/2016 FINAL  Final  Culture, blood (routine x 2)     Status: None   Collection Time: 03/24/16  6:17 PM  Result Value Ref Range Status   Specimen Description BLOOD RIGHT HAND  Final   Special Requests IN PEDIATRIC BOTTLE  1CC  Final   Culture NO GROWTH 5 DAYS  Final   Report Status 03/29/2016 FINAL  Final  Culture, blood (routine x 2)     Status: None   Collection Time: 03/24/16  6:28 PM  Result Value Ref Range Status   Specimen Description BLOOD LEFT HAND  Final   Special Requests IN PEDIATRIC BOTTLE  1CC  Final   Culture NO GROWTH 5 DAYS  Final   Report Status 03/29/2016 FINAL  Final         Radiology Studies: Dg Chest 1 View  Result Date: 03/29/2016 CLINICAL DATA:  Hypoxia. Initially admitted with pneumonia and sepsis superimposed on COPD, extubated on 03/27/2016. EXAM: CHEST 1 VIEW COMPARISON:  Chest x-rays dated 03/26/2016, 03/25/2016 and 03/21/2016. FINDINGS: Cardiomediastinal silhouette is stable. Atherosclerotic change noted at the aortic arch. Again noted are small layering pleural effusions at each lung base. Mild interstitial prominence persists. No new lung findings. IMPRESSION: 1. Stable small pleural effusions at each lung base, with probable adjacent atelectasis. 2. Stable interstitial prominence bilaterally, likely mild interstitial edema superimposed on chronic interstitial lung disease. 3. No new lung findings on today's exam. 4. Aortic atherosclerosis. Electronically Signed   By: Bary Richard M.D.   On: 03/29/2016 13:43   Dg Swallowing Func-speech Pathology  Result Date: 03/29/2016 Objective Swallowing Evaluation: Type of Study: MBS-Modified Barium Swallow Study Patient Details Name: Dana [name] MRN: [id number] Date of Birth: 12/01/1932 Today's Date: 03/29/2016 Time: SLP Start Time (ACUTE ONLY):  1238-SLP Stop Time (ACUTE ONLY): 1251 SLP Time Calculation (min) (ACUTE ONLY): 13 min Past Medical  History: No past medical history on file. Past Surgical History: No past surgical history on file. HPI: 82 year old female admitted to [facility] on 9/22 w/ working dx of CAP, AECOPD and Sepsis. She continued to decline with worsening WOB, hypercapnia,a nd possible pulmonary edema requiring intubation 9/25-9/28. She was transferred to [facility] 9/25. She is on 2 liters Oxygen at baseline.  Subjective: pt alert, says she had trouble with pills getting "stuck" PTA Assessment / Plan / Recommendation CHL IP CLINICAL IMPRESSIONS 03/29/2016 Therapy Diagnosis Mild oral phase dysphagia;Mild pharyngeal phase dysphagia Clinical Impression Pt has a mild oropharyngeal dysphagia with decreased bolus cohesion and mild delay in swallow initiation. She also has reduced hyolarngeal movement and epiglottic deflection. As a result of the above, pt has frank penetration of thin liquids that is in smaller quantities, but is consistent and silent, increasing her risk of developing an aspiration-related infection. She has improved airway protection with thicker liquids and solids, although with solids she does have impaired mastication due to her lack of dentition. Recommend Dys 2 diet and nectar thick liquids. Impact on safety and function Mild aspiration risk;Moderate aspiration risk   CHL IP TREATMENT RECOMMENDATION 03/29/2016 Treatment Recommendations Therapy as outlined in treatment plan below   Prognosis 03/29/2016 Prognosis for Safe Diet Advancement Good Barriers to Reach Goals -- Barriers/Prognosis Comment -- CHL IP DIET RECOMMENDATION 03/29/2016 SLP Diet Recommendations Dysphagia 2 (Fine chop) solids;Nectar thick liquid Liquid Administration via Cup;Straw Medication Administration Whole meds with puree Compensations Slow rate;Small sips/bites Postural Changes Seated upright at 90 degrees   CHL IP OTHER RECOMMENDATIONS 03/29/2016 Recommended Consults -- Oral Care Recommendations Oral care BID Other Recommendations Order thickener from  pharmacy;Prohibited food (jello, ice cream, thin soups);Remove water pitcher   CHL IP FOLLOW UP RECOMMENDATIONS 03/29/2016 Follow up Recommendations (No Data)   CHL IP FREQUENCY AND DURATION 03/29/2016 Speech Therapy Frequency (ACUTE ONLY) min 2x/week Treatment Duration 2 weeks      CHL IP ORAL PHASE 03/29/2016 Oral Phase Impaired Oral - Pudding Teaspoon -- Oral - Pudding Cup -- Oral - Honey Teaspoon -- Oral - Honey Cup -- Oral - Nectar Teaspoon -- Oral - Nectar Cup Decreased bolus cohesion Oral - Nectar Straw Decreased bolus cohesion Oral - Thin Teaspoon -- Oral - Thin Cup Decreased bolus cohesion Oral - Thin Straw -- Oral - Puree Decreased bolus cohesion Oral - Mech Soft -- Oral - Regular Decreased bolus cohesion;Impaired mastication Oral - Multi-Consistency -- Oral - Pill -- Oral Phase - Comment --  CHL IP PHARYNGEAL PHASE 03/29/2016 Pharyngeal Phase Impaired Pharyngeal- Pudding Teaspoon -- Pharyngeal -- Pharyngeal- Pudding Cup -- Pharyngeal -- Pharyngeal- Honey Teaspoon -- Pharyngeal -- Pharyngeal- Honey Cup -- Pharyngeal -- Pharyngeal- Nectar Teaspoon -- Pharyngeal -- Pharyngeal- Nectar Cup Delayed swallow initiation-vallecula;Reduced anterior laryngeal mobility;Reduced laryngeal elevation;Reduced airway/laryngeal closure Pharyngeal -- Pharyngeal- Nectar Straw Delayed swallow initiation-vallecula;Reduced anterior laryngeal mobility;Reduced laryngeal elevation;Reduced airway/laryngeal closure;Penetration/Aspiration during swallow Pharyngeal Material enters airway, remains ABOVE vocal cords then ejected out Pharyngeal- Thin Teaspoon -- Pharyngeal -- Pharyngeal- Thin Cup Delayed swallow initiation-vallecula;Reduced anterior laryngeal mobility;Reduced laryngeal elevation;Reduced airway/laryngeal closure;Penetration/Aspiration before swallow Pharyngeal Material enters airway, remains ABOVE vocal cords and not ejected out Pharyngeal- Thin Straw -- Pharyngeal -- Pharyngeal- Puree Delayed swallow  initiation-vallecula;Reduced anterior laryngeal mobility;Reduced laryngeal elevation;Reduced airway/laryngeal closure Pharyngeal -- Pharyngeal- Mechanical Soft -- Pharyngeal -- Pharyngeal- Regular Delayed swallow initiation-vallecula;Reduced anterior laryngeal mobility;Reduced laryngeal elevation;Reduced airway/laryngeal closure Pharyngeal --  Pharyngeal- Multi-consistency -- Pharyngeal -- Pharyngeal- Pill -- Pharyngeal -- Pharyngeal Comment --  CHL IP CERVICAL ESOPHAGEAL PHASE 03/29/2016 Cervical Esophageal Phase WFL Pudding Teaspoon -- Pudding Cup -- Honey Teaspoon -- Honey Cup -- Nectar Teaspoon -- Nectar Cup -- Nectar Straw -- Thin Teaspoon -- Thin Cup -- Thin Straw -- Puree -- Mechanical Soft -- Regular -- Multi-consistency -- Pill -- Cervical Esophageal Comment -- No flowsheet data found. Maxcine Ham 03/29/2016, 2:03 PM  Maxcine Ham, M.A. CCC-SLP [phone]                  Scheduled Meds: . amiodarone  200 mg Oral Daily  . aspirin  81 mg Oral Daily  . budesonide (PULMICORT) nebulizer solution  0.5 mg Nebulization BID  . ceFEPime (MAXIPIME) IV  2 g Intravenous Q24H  . famotidine  20 mg Oral QHS  . heparin  5,000 Units Subcutaneous Q8H  . insulin aspart  0-9 Units Subcutaneous TID WC  . ipratropium-albuterol  3 mL Nebulization QID  . mouth rinse  15 mL Mouth Rinse BID  . [START ON 03/31/2016] methylPREDNISolone (SOLU-MEDROL) injection  40 mg Intravenous Q12H  . nicotine  21 mg Transdermal Daily   Continuous Infusions: . feeding supplement (VITAL AF 1.2 CAL) 1,000 mL (03/27/16 1000)     LOS: 6 days    Time spent: 35 minutes.     Alba Cory, MD [facility] Pager [phone]  If 7PM-7AM, please contact night-coverage [email or web address] Password [email or web address] 03/30/2016, 2:18 PM

## 2016-03-30 NOTE — Progress Notes (Signed)
Pharmacy Antibiotic Note  [name] Fiero is a 82 y.o. female admitted on 03/24/2016 with VDRF.   Day # 7 of Cefepime, consider adding a stop date, 8 days of treatment?  Plan: Continue cefepime 2g IV every 24 hours D/C vancomycin Monitor renal function and f/u cultures, LOT, and ability to de-escalate  Height: 4\' 7"  (139.7 cm) Weight: 118 lb 6.4 oz (53.7 kg) IBW/kg (Calculated) : 34  Temp (24hrs), Avg:98.1 F (36.7 C), Min:97.5 F (36.4 C), Max:99 F (37.2 C)   Recent Labs Lab 03/24/16 1817  03/24/16 2213 03/25/16 0516 03/26/16 0706  03/27/16 0314 03/28/16 0142 03/29/16 0406 03/30/16 0119 03/30/16 0914  WBC  --   < >  --  14.2* 18.4*  --  23.4* 22.9* 18.6* 19.9*  --   CREATININE  --   < >  --  0.93 0.81  < > 0.80 0.78 0.80 0.89 0.89  LATICACIDVEN 2.5*  --  2.9* 1.9  --   --   --   --   --   --   --   < > = values in this interval not displayed.  Estimated Creatinine Clearance: 32.8 mL/min (by C-G formula based on SCr of 0.89 mg/dL).    No Known Allergies   Antimicrobials this admission:  Vancomycin 9/25>>9/27 Cefepime 9/25 >> Azithro 9/22 >>9/26 Rocephin ?9/22 >> 9/25  Microbiology results:  9/27 Strep pneumo: 9/25 Legionella: Neg 9/25 Strep pneumo: Cancelled - quantity not sufficient 9/25BCx: NGTD 9/25Sputum: rare gram negative rods 9/25MRSA PCR: Negative 9/22 blood cx from OSH - no growth x 48   Imaging: 9/28 CXR: Question alveolar edema versus pneumonia left lower lobe; both entities may exist concurrently.  Thank you for allowing pharmacy to be a part of this patient's care. Okey RegalLisa Kaemon Barnett, PharmD [phone] 03/30/2016 12:37 PM

## 2016-03-31 DIAGNOSIS — J9621 Acute and chronic respiratory failure with hypoxia: Secondary | ICD-10-CM

## 2016-03-31 LAB — CBC WITH DIFFERENTIAL/PLATELET
BASOS ABS: 0 10*3/uL (ref 0.0–0.1)
Basophils Relative: 0 %
Eosinophils Absolute: 0 10*3/uL (ref 0.0–0.7)
Eosinophils Relative: 0 %
HEMATOCRIT: 32.9 % — AB (ref 36.0–46.0)
Hemoglobin: 10 g/dL — ABNORMAL LOW (ref 12.0–15.0)
LYMPHS PCT: 4 %
Lymphs Abs: 1.1 10*3/uL (ref 0.7–4.0)
MCH: 28.7 pg (ref 26.0–34.0)
MCHC: 30.4 g/dL (ref 30.0–36.0)
MCV: 94.5 fL (ref 78.0–100.0)
MONO ABS: 1.7 10*3/uL — AB (ref 0.1–1.0)
MONOS PCT: 7 %
NEUTROS ABS: 22.1 10*3/uL — AB (ref 1.7–7.7)
Neutrophils Relative %: 89 %
Platelets: 263 10*3/uL (ref 150–400)
RBC: 3.48 MIL/uL — ABNORMAL LOW (ref 3.87–5.11)
RDW: 16 % — AB (ref 11.5–15.5)
WBC: 24.9 10*3/uL — ABNORMAL HIGH (ref 4.0–10.5)

## 2016-03-31 LAB — RENAL FUNCTION PANEL
ALBUMIN: 2.4 g/dL — AB (ref 3.5–5.0)
Anion gap: 5 (ref 5–15)
BUN: 24 mg/dL — ABNORMAL HIGH (ref 6–20)
CALCIUM: 8.8 mg/dL — AB (ref 8.9–10.3)
CO2: 35 mmol/L — AB (ref 22–32)
CREATININE: 1.05 mg/dL — AB (ref 0.44–1.00)
Chloride: 97 mmol/L — ABNORMAL LOW (ref 101–111)
GFR, EST AFRICAN AMERICAN: 56 mL/min — AB (ref >60)
GFR, EST NON AFRICAN AMERICAN: 48 mL/min — AB (ref >60)
Glucose, Bld: 121 mg/dL — ABNORMAL HIGH (ref 65–99)
PHOSPHORUS: 2.9 mg/dL (ref 2.5–4.6)
Potassium: 5.2 mmol/L — ABNORMAL HIGH (ref 3.5–5.1)
SODIUM: 137 mmol/L (ref 135–145)

## 2016-03-31 LAB — GLUCOSE, CAPILLARY
GLUCOSE-CAPILLARY: 120 mg/dL — AB (ref 65–99)
GLUCOSE-CAPILLARY: 126 mg/dL — AB (ref 65–99)
GLUCOSE-CAPILLARY: 144 mg/dL — AB (ref 65–99)
Glucose-Capillary: 130 mg/dL — ABNORMAL HIGH (ref 65–99)
Glucose-Capillary: 135 mg/dL — ABNORMAL HIGH (ref 65–99)

## 2016-03-31 LAB — POTASSIUM: POTASSIUM: 5 mmol/L (ref 3.5–5.1)

## 2016-03-31 LAB — MAGNESIUM: Magnesium: 2.2 mg/dL (ref 1.7–2.4)

## 2016-03-31 MED ORDER — PREDNISONE 20 MG PO TABS
40.0000 mg | ORAL_TABLET | Freq: Every day | ORAL | Status: DC
  Administered 2016-03-31 – 2016-04-01 (×2): 40 mg via ORAL
  Filled 2016-03-31 (×2): qty 2

## 2016-03-31 MED ORDER — IPRATROPIUM-ALBUTEROL 0.5-2.5 (3) MG/3ML IN SOLN
3.0000 mL | Freq: Two times a day (BID) | RESPIRATORY_TRACT | Status: DC
  Administered 2016-03-31 – 2016-04-01 (×2): 3 mL via RESPIRATORY_TRACT
  Filled 2016-03-31 (×2): qty 3

## 2016-03-31 MED ORDER — SODIUM POLYSTYRENE SULFONATE 15 GM/60ML PO SUSP
15.0000 g | Freq: Once | ORAL | Status: AC
  Administered 2016-03-31: 15 g via ORAL
  Filled 2016-03-31: qty 60

## 2016-03-31 MED ORDER — ENSURE ENLIVE PO LIQD
237.0000 mL | Freq: Two times a day (BID) | ORAL | Status: DC
  Administered 2016-03-31 – 2016-04-01 (×2): 237 mL via ORAL

## 2016-03-31 NOTE — Care Management Important Message (Signed)
Important Message  Patient Details  Name: Dana [name] MRN: [id number] Date of Birth: 09/20/1932   Medicare Important Message Given:  Yes    Kyla BalzarineShealy, Graycie Halley Abena 03/31/2016, 12:21 PM

## 2016-03-31 NOTE — Care Management Note (Signed)
Case Management Note Previous CM note initiated by Cherylann Parrlaxton, Samantha S, RN 03/28/2016, 4:25 PM  Patient Details  Name: Dana [name] MRN: [id number] Date of Birth: 12/04/1932  Subjective/Objective:        Pt admitted with resp failure- possibly secondary to aspiration            Action/Plan:  PTA pt was staying with 2 adult daughters, on home oxygen .  Pt extremely deconditioned - verified by bedside nurse.  CM requested PT eval once medically stable (pt desats into 80's on supplemental oxygen ) with slight movement.  CM will continue to follow for discharge needs.   Pt tx from ICU-53M to 2W on 03/28/16  Expected Discharge Date:                  Expected Discharge Plan:  Home w Home Health Services  In-House Referral:  Clinical Social Work  Discharge planning Services  CM Consult  Post Acute Care Choice:  Home Health Choice offered to:  Patient, Adult Children  DME Arranged:    DME Agency:     HH Arranged:  RN, OT HH Agency:     Status of Service:  In process, will continue to follow  If discussed at Long Length of Stay Meetings, dates discussed:    Additional Comments:  03/31/16- 1600- Saydi Kobel RN, CM- PT recommendation for SNF- per CSW pt and daughter would like to return home- HH orders for RN/PT have been placed- spoke with pt and daughter at bedside- [facility] [place]- daughter would like to look over list overnight-CM will f/u in am prior to d/c for choice- per daughter pt has home 02 with [facility]- willl bring tank from home for transport home in am-   Darrold SpanWebster, Jerricka Carvey Hall, RN 03/31/2016, 3:58 PM [phone]

## 2016-03-31 NOTE — Progress Notes (Signed)
PROGRESS NOTE    Dana [name]  AVW:[id number] DOB: 1933/02/01 DOA: 03/24/2016 PCP: No primary care provider on file.   Brief Narrative: 82 year old female admitted to [facility] on 9/22 w/ working dx of CAP, AECOPD and  Sepsis. She is on 2 liters Oxygen at baseline. She was treated in usual fashion which included: azith/roceph, systemic steroids, inhaled BDs and supplemental oxygen.  Over the course of her stay she required escalating supplemental oxygen going from high-flow to BIPAP d/t increasing WOB. On the am of 9/25 the pt was desaturating further and also demonstrated increased CO2 retention w/ PCO2 of 92. CXR obtained that am raising concern for increasing edema. Was given IV lasix, but ultimately intubated. Her abx were widened to include: vanc and cefepime on 9/25, azithro was continued. She was accepted at [facility] but no beds were available so she was transferred to [facility].    Assessment & Plan:   Active Problems:   Respiratory failure (HCC)   CAP (community acquired pneumonia)   Demand ischemia (HCC)  1-Acute on Chronic Hypoxic and Hypercarbic Respiratory Failure - Secondary to Severe CAP & COPD -Exacerbation. Extubated 9/28.  Severe CAP Acute COPD Exacerbation  -Tobacco Use Disorder  -Small Bilateral Pleural Effusions - L>R -Patient still requiring 4 to 5 L of oxygen.  -Continue with schedule nebulizer treatments.  -Incentive Spirometry for pulmonary toilette -Repeated chest x ray 10-30: Stable small pleural effusions at each lung base, with probable adjacent atelectasis. Stable interstitial prominence bilaterally, likely mild interstitial edema superimposed on chronic interstitial lung disease. -received IV lasix 09-30, no further lasix for now.  -Lung exam with less wheezing,change solumedrol to prednisone.   2-Severe CAP Continue with cefepime.  Day 8. Follow WBC.   A fib - RVR overnight 9/27. Resolved w/ Amio.  EKG. Now sinus.  Now on oral amiodarone, need taper off  out patient.   AKI;  Hold lasix. repeat labs in am/  Will give one time dose of kayexalate.   Leukocytosis;  In setting of infection and steroids.  Continue with IV antibiotics.   Protein-Calorie Malnutrition, nutrition;  Started on dysphagia 2 diet   Elevated Troponin I - Likely demand ishcemia. No wall motion on TTE.  Shock - Sepsis versus cardiac in etiology. Resolved off sedation.   DVT prophylaxis: heparin.  Code Status; full code.  Family Communication: care discussed with patient  Disposition Plan: daughter wants for patient to go back home, decline SNF. Will order HH. Plan to discharge 10-03 if renal function, improved and WBC trending down.   Consultants:   CCM admitted patient   Procedures:  ECHO; Left ventricle: The cavity size was normal. There was mild   concentric hypertrophy. Systolic function was vigorous. The   estimated ejection fraction was in the range of 65% to 70%. Wall   motion was normal; there were no regional wall motion   abnormalities. Left ventricular diastolic function parameters    were normal  Antimicrobials:   Cefepime 9-25  SIGNIFICANT EVENTS: 9/22 - 9/25 admitted w/ working dx of AECOPD and CAP. Treated in usual fashion: ABX, systemic steroids, BDs, O2. Continued to decline clinically w/ worsening WOB, hypercapnia and possible pulmonary edema. Intubated and transferred to [facility] on  9/25 - Intubated & transferred to [facility] for PCCM management.    MICROBIOLOGY: Blood Ctx (at [facility]) 9/24 >> Lower Resp Ctx (at [facility]) >> MRSA PCR 9/25:  Negative Blood Ctx x2 9/25 >>no growth to date.  Tracheal Asp Ctx 9/25 >>  no growth to date  Urine Legionella Ag 9/25:  Negative Urine Strep Ag 9/28:  Negative   Subjective: Sh is clinically better today, breathing better. Lung exam clear.  Afebrile.    Objective: Vitals:   03/31/16 0724 03/31/16 0727 03/31/16 1301 03/31/16 1302  BP:   (!) 106/49   Pulse:   74   Resp:   18   Temp:   97.8 F  (36.6 C)   TempSrc:   Oral   SpO2: 93% 93% 93% 96%  Weight:      Height:        Intake/Output Summary (Last 24 hours) at 03/31/16 1527 Last data filed at 03/31/16 1300  Gross per 24 hour  Intake              360 ml  Output                0 ml  Net              360 ml   Filed Weights   03/28/16 0500 03/29/16 0300 03/30/16 0517  Weight: 51.7 kg (113 lb 15.7 oz) 55.8 kg (123 lb) 53.7 kg (118 lb 6.4 oz)    Examination:  General exam: essential tremors , constant of head, upper body.  Respiratory system: Respiratory effort normal. No wheezing.  Cardiovascular system: S1 & S2 heard, RRR. No JVD, murmurs, rubs, gallops or clicks. No pedal edema. Gastrointestinal system: Abdomen is nondistended, soft and nontender. No organomegaly or masses felt. Normal bowel sounds heard. Central nervous system: Alert and oriented. No focal neurological deficits. Extremities: Symmetric 5 x 5 power. Skin: No rashes, lesions or ulcers     Data Reviewed: I have personally reviewed following labs and imaging studies  CBC:  Recent Labs Lab 03/27/16 0314 03/28/16 0142 03/29/16 0406 03/30/16 0119 03/31/16 0220  WBC 23.4* 22.9* 18.6* 19.9* 24.9*  NEUTROABS 19.4* 16.9* 14.5* 17.7* 22.1*  HGB 8.7* 9.2* 9.1* 9.4* 10.0*  HCT 28.5* 30.6* 30.0* 29.8* 32.9*  MCV 93.1 93.9 93.8 92.0 94.5  PLT 214 229 219 243 263   Basic Metabolic Panel:  Recent Labs Lab 03/27/16 0314 03/28/16 0142 03/29/16 0406 03/30/16 0119 03/30/16 0914 03/31/16 0220 03/31/16 1209  NA 138 140 139 135 139 137  --   K 4.2 4.3 4.8 5.7* 4.6 5.2* 5.0  CL 98* 102 102 99* 97* 97*  --   CO2 31 34* 32 31 36* 35*  --   GLUCOSE 298* 96 76 78 128* 121*  --   BUN 26* 20 21* 24* 22* 24*  --   CREATININE 0.80 0.78 0.80 0.89 0.89 1.05*  --   CALCIUM 7.8* 8.3* 8.6* 8.4* 9.0 8.8*  --   MG 1.9 2.3 2.2 2.2  --  2.2  --   PHOS 1.9* 2.9 3.4 3.3  --  2.9  --    GFR: Estimated Creatinine Clearance: 27.8 mL/min (by C-G formula based on  SCr of 1.05 mg/dL (H)). Liver Function Tests:  Recent Labs Lab 03/24/16 1829 03/27/16 0314 03/28/16 0142 03/29/16 0406 03/30/16 0119 03/31/16 0220  AST 20  --   --   --   --   --   ALT 16  --   --   --   --   --   ALKPHOS 61  --   --   --   --   --   BILITOT 0.6  --   --   --   --   --  PROT 5.7*  --   --   --   --   --   ALBUMIN 2.4* 1.8* 1.9* 2.0* 2.2* 2.4*   No results for input(s): LIPASE, AMYLASE in the last 168 hours. No results for input(s): AMMONIA in the last 168 hours. Coagulation Profile: No results for input(s): INR, PROTIME in the last 168 hours. Cardiac Enzymes:  Recent Labs Lab 03/26/16 0706 03/26/16 2030 03/27/16 1143 03/27/16 1633 03/27/16 2241  TROPONINI 0.09* 0.07* 0.05* 0.05* 0.05*   BNP (last 3 results) No results for input(s): PROBNP in the last 8760 hours. HbA1C: No results for input(s): HGBA1C in the last 72 hours. CBG:  Recent Labs Lab 03/30/16 1604 03/30/16 2003 03/31/16 0017 03/31/16 0840 03/31/16 1120  GLUCAP 123* 214* 126* 130* 135*   Lipid Profile: No results for input(s): CHOL, HDL, LDLCALC, TRIG, CHOLHDL, LDLDIRECT in the last 72 hours. Thyroid Function Tests: No results for input(s): TSH, T4TOTAL, FREET4, T3FREE, THYROIDAB in the last 72 hours. Anemia Panel: No results for input(s): VITAMINB12, FOLATE, FERRITIN, TIBC, IRON, RETICCTPCT in the last 72 hours. Sepsis Labs:  Recent Labs Lab 03/24/16 1817 03/24/16 1829 03/24/16 2213 03/25/16 0516 03/26/16 0706  PROCALCITON  --  0.14  --  0.11 0.12  LATICACIDVEN 2.5*  --  2.9* 1.9  --     Recent Results (from the past 240 hour(s))  MRSA PCR Screening     Status: None   Collection Time: 03/24/16  4:31 PM  Result Value Ref Range Status   MRSA by PCR NEGATIVE NEGATIVE Final    Comment:        The GeneXpert MRSA Assay (FDA approved for NASAL specimens only), is one component of a comprehensive MRSA colonization surveillance program. It is not intended to diagnose  MRSA infection nor to guide or monitor treatment for MRSA infections.   Culture, respiratory (tracheal aspirate)     Status: None   Collection Time: 03/24/16  4:46 PM  Result Value Ref Range Status   Specimen Description TRACHEAL ASPIRATE  Final   Special Requests NONE  Final   Gram Stain   Final    MODERATE WBC PRESENT, PREDOMINANTLY PMN RARE GRAM NEGATIVE RODS    Culture NO GROWTH 2 DAYS  Final   Report Status 03/26/2016 FINAL  Final  Culture, blood (routine x 2)     Status: None   Collection Time: 03/24/16  6:17 PM  Result Value Ref Range Status   Specimen Description BLOOD RIGHT HAND  Final   Special Requests IN PEDIATRIC BOTTLE  1CC  Final   Culture NO GROWTH 5 DAYS  Final   Report Status 03/29/2016 FINAL  Final  Culture, blood (routine x 2)     Status: None   Collection Time: 03/24/16  6:28 PM  Result Value Ref Range Status   Specimen Description BLOOD LEFT HAND  Final   Special Requests IN PEDIATRIC BOTTLE  1CC  Final   Culture NO GROWTH 5 DAYS  Final   Report Status 03/29/2016 FINAL  Final         Radiology Studies: No results found.      Scheduled Meds: . amiodarone  200 mg Oral Daily  . aspirin  81 mg Oral Daily  . budesonide (PULMICORT) nebulizer solution  0.5 mg Nebulization BID  . ceFEPime (MAXIPIME) IV  2 g Intravenous Q24H  . famotidine  20 mg Oral QHS  . feeding supplement (ENSURE ENLIVE)  237 mL Oral BID BM  . heparin  5,000  Units Subcutaneous Q8H  . insulin aspart  0-9 Units Subcutaneous TID WC  . ipratropium-albuterol  3 mL Nebulization BID  . mouth rinse  15 mL Mouth Rinse BID  . nicotine  21 mg Transdermal Daily  . polyethylene glycol  17 g Oral Daily  . predniSONE  40 mg Oral Q breakfast  . senna-docusate  1 tablet Oral BID  . sodium phosphate  1 enema Rectal Once  . sodium polystyrene  15 g Oral Once   Continuous Infusions:     LOS: 7 days    Time spent: 35 minutes.     Alba Cory, MD [facility] Pager  [phone]  If 7PM-7AM, please contact night-coverage [email or web address] Password [email or web address] 03/31/2016, 3:27 PM

## 2016-03-31 NOTE — Progress Notes (Signed)
Speech Language Pathology Treatment: Dysphagia  Patient Details Name: Dana Dana [name] MRN: [id number] DOB: 03/16/1933 Today'Dana [name] Date: 03/31/2016 Time: 1130-1140 SLP Time Calculation (min) (ACUTE ONLY): 10 min  Assessment / Plan / Recommendation Clinical Impression  Fu after MBS.  Reviewed results/recs with pt and her daughter.  Instructed and demonstrated to daughter how to thicken liquids to nectar; discussed rationale.  [facility] Pt consumed nectar liquids with no overt Dana [name]/Dana [name] of aspiration; min cues to slow rate and allow time for adequate ventilation.  Overall, adequate toleration of current diet.  Lungs diminished but clear.  Continue current diet; will follow while in acute care.    HPI HPI: 82 year old female admitted to [facility] on 9/22 w/ working dx of CAP, AECOPD and Sepsis. She continued to decline with worsening WOB, hypercapnia,a nd possible pulmonary edema requiring intubation 9/25-9/28. [facility]. She is on 2 liters Oxygen at baseline.       SLP Plan  Continue with current plan of care     Recommendations  Diet recommendations: Dysphagia 2 (fine chop);Nectar-thick liquid Liquids provided via: Cup Medication Administration: Whole meds with puree Supervision: Full supervision/cueing for compensatory strategies Compensations: Slow rate;Small sips/bites Postural Changes and/or Swallow Maneuvers: Seated upright 90 degrees                Oral Care Recommendations: Oral care BID Follow up Recommendations: None Plan: Continue with current plan of care       GO               Dana Heidel L. Dana Dana [name], [place] CCC/SLP Pager [phone]  Dana Dana [name], Dana Dana [name] 03/31/2016, 11:48 AM

## 2016-03-31 NOTE — Progress Notes (Signed)
Physical Therapy Treatment Patient Details Name: Dana [name] MRN: [id number] DOB: 11/25/1932 Today's Date: 03/31/2016    History of Present Illness 82 yo female, admitted with Pneumonia and sepsis on top of COPD, ultimately intubated on 9/26, extubated 03/27/16.  On 2L O2 at home.    PT Comments    Pt was seen for bedside exercise with ncreased fatigue with the exercise.  Her O2 sats held on but she is in need of signfificant help to be safe even sitting bedside.  Will follow acutely awaiting SNF.    Follow Up Recommendations  SNF     Equipment Recommendations  None recommended by PT    Recommendations for Other Services Speech consult     Precautions / Restrictions Precautions Precautions: Fall Restrictions Weight Bearing Restrictions: No    Mobility  Bed Mobility Overal bed mobility: Needs Assistance Bed Mobility: Supine to Sit;Sit to Supine     Supine to sit: Mod assist Sit to supine: Mod assist      Transfers                 General transfer comment: unable  Ambulation/Gait             General Gait Details: unable    Stairs            Wheelchair Mobility    Modified Rankin (Stroke Patients Only)       Balance Overall balance assessment: Needs assistance Sitting-balance support: Feet supported Sitting balance-Leahy Scale: Fair                              Cognition Arousal/Alertness: Awake/alert Behavior During Therapy: WFL for tasks assessed/performed Overall Cognitive Status: Within Functional Limits for tasks assessed                      Exercises General Exercises - Lower Extremity Ankle Circles/Pumps: AROM;10 reps;Both Short Arc Quad: Strengthening;Both;10 reps Heel Slides: Strengthening;Both;10 reps    General Comments General comments (skin integrity, edema, etc.): O2 was left on as that is her PLOF.  Has sats of 93% initially then 98% after siting exercise       Pertinent Vitals/Pain  Pain Assessment: Faces Faces Pain Scale: No hurt    Home Living                      Prior Function            PT Goals (current goals can now be found in the care plan section) Acute Rehab PT Goals Patient Stated Goal: Get better. Progress towards PT goals: Progressing toward goals    Frequency    Min 3X/week      PT Plan Current plan remains appropriate    Co-evaluation             End of Session Equipment Utilized During Treatment: Oxygen Activity Tolerance: Patient tolerated treatment well Patient left: in bed;with call bell/phone within reach;with family/visitor present     Time: 0925-0942 PT Time Calculation (min) (ACUTE ONLY): 17 min  Charges:  $Therapeutic Exercise: 8-22 mins                    G Codes:      Ivar DrapeStout, Jolayne Branson E 03/31/2016, 11:44 AM    Samul Dadauth Osby Sweetin, PT MS Acute Rehab Dept. Number: [facility] [phone] and [facility] [phone]

## 2016-04-01 LAB — CBC WITH DIFFERENTIAL/PLATELET
BASOS PCT: 0 %
Basophils Absolute: 0 10*3/uL (ref 0.0–0.1)
EOS ABS: 0 10*3/uL (ref 0.0–0.7)
EOS PCT: 0 %
HCT: 32.2 % — ABNORMAL LOW (ref 36.0–46.0)
HEMOGLOBIN: 9.6 g/dL — AB (ref 12.0–15.0)
LYMPHS ABS: 1.1 10*3/uL (ref 0.7–4.0)
Lymphocytes Relative: 4 %
MCH: 28.6 pg (ref 26.0–34.0)
MCHC: 29.8 g/dL — AB (ref 30.0–36.0)
MCV: 95.8 fL (ref 78.0–100.0)
MONO ABS: 2.3 10*3/uL — AB (ref 0.1–1.0)
MONOS PCT: 10 %
NEUTROS PCT: 86 %
Neutro Abs: 20.5 10*3/uL — ABNORMAL HIGH (ref 1.7–7.7)
Platelets: 285 10*3/uL (ref 150–400)
RBC: 3.36 MIL/uL — ABNORMAL LOW (ref 3.87–5.11)
RDW: 16.5 % — AB (ref 11.5–15.5)
WBC: 23.9 10*3/uL — ABNORMAL HIGH (ref 4.0–10.5)

## 2016-04-01 LAB — RENAL FUNCTION PANEL
ALBUMIN: 2.4 g/dL — AB (ref 3.5–5.0)
Anion gap: 4 — ABNORMAL LOW (ref 5–15)
BUN: 31 mg/dL — AB (ref 6–20)
CALCIUM: 8.5 mg/dL — AB (ref 8.9–10.3)
CO2: 36 mmol/L — AB (ref 22–32)
CREATININE: 0.96 mg/dL (ref 0.44–1.00)
Chloride: 100 mmol/L — ABNORMAL LOW (ref 101–111)
GFR calc non Af Amer: 54 mL/min — ABNORMAL LOW (ref >60)
GLUCOSE: 103 mg/dL — AB (ref 65–99)
Phosphorus: 3.5 mg/dL (ref 2.5–4.6)
Potassium: 5 mmol/L (ref 3.5–5.1)
SODIUM: 140 mmol/L (ref 135–145)

## 2016-04-01 LAB — GLUCOSE, CAPILLARY
GLUCOSE-CAPILLARY: 82 mg/dL (ref 65–99)
GLUCOSE-CAPILLARY: 119 mg/dL — AB (ref 65–99)
Glucose-Capillary: 115 mg/dL — ABNORMAL HIGH (ref 65–99)

## 2016-04-01 LAB — MAGNESIUM: Magnesium: 2.2 mg/dL (ref 1.7–2.4)

## 2016-04-01 MED ORDER — FAMOTIDINE 20 MG PO TABS: 20.0000 mg | ORAL_TABLET | Freq: Every day | ORAL | 0 refills | Status: DC

## 2016-04-01 MED ORDER — BUDESONIDE 0.5 MG/2ML IN SUSP: 0.5000 mg | Freq: Two times a day (BID) | RESPIRATORY_TRACT | 0 refills | Status: AC

## 2016-04-01 MED ORDER — SODIUM POLYSTYRENE SULFONATE 15 GM/60ML PO SUSP
30.0000 g | Freq: Once | ORAL | Status: AC
  Administered 2016-04-01: 30 g via ORAL
  Filled 2016-04-01: qty 120

## 2016-04-01 MED ORDER — NICOTINE 21 MG/24HR TD PT24: 21.0000 mg | MEDICATED_PATCH | Freq: Every day | TRANSDERMAL | 0 refills | Status: AC

## 2016-04-01 MED ORDER — ENSURE ENLIVE PO LIQD: 237.0000 mL | Freq: Two times a day (BID) | ORAL | 12 refills | Status: AC

## 2016-04-01 MED ORDER — ASPIRIN 81 MG PO CHEW: 81.0000 mg | CHEWABLE_TABLET | Freq: Every day | ORAL | 0 refills | Status: AC

## 2016-04-01 MED ORDER — SACCHAROMYCES BOULARDII 250 MG PO CAPS: 250.0000 mg | ORAL_CAPSULE | Freq: Two times a day (BID) | ORAL | 0 refills | Status: DC

## 2016-04-01 MED ORDER — PREDNISONE 20 MG PO TABS: ORAL_TABLET | ORAL | 0 refills | Status: DC

## 2016-04-01 MED ORDER — ORAL CARE MOUTH RINSE: 15.0000 mL | Freq: Two times a day (BID) | OROMUCOSAL | 0 refills | Status: DC

## 2016-04-01 MED ORDER — AMOXICILLIN-POT CLAVULANATE 875-125 MG PO TABS: 1.0000 | ORAL_TABLET | Freq: Two times a day (BID) | ORAL | 0 refills | Status: DC

## 2016-04-01 MED ORDER — SENNOSIDES-DOCUSATE SODIUM 8.6-50 MG PO TABS: 1.0000 | ORAL_TABLET | Freq: Two times a day (BID) | ORAL | 0 refills | Status: AC

## 2016-04-01 MED ORDER — IPRATROPIUM-ALBUTEROL 0.5-2.5 (3) MG/3ML IN SOLN: 3.0000 mL | Freq: Four times a day (QID) | RESPIRATORY_TRACT | 0 refills | Status: AC

## 2016-04-01 MED ORDER — AMIODARONE HCL 200 MG PO TABS: 200.0000 mg | ORAL_TABLET | Freq: Every day | ORAL | 0 refills | Status: DC

## 2016-04-01 NOTE — Progress Notes (Signed)
Pt d/c'ed to [facility]. Report given to RN at 1450. IVs removed, patient tolerated well. Tele d/c'ed. Pt transported by [facility] with daughter accompanying her. Discharge instructions given and all questions answered. VSS. All belongings sent with patient.  Margarito LinerStephanie M Destanee Bedonie, RN

## 2016-04-01 NOTE — Clinical Social Work Placement (Signed)
   CLINICAL SOCIAL WORK PLACEMENT  NOTE  Date:  04/01/2016  Patient Details  Name: Dana [name] MRN: [id number] Date of Birth: 05/01/1933  Clinical Social Work is seeking post-discharge placement for this patient at the Skilled  Nursing Facility level of care (*CSW will initial, date and re-position this form in  chart as items are completed):  Yes   Patient/family provided with [facility] Clinical Social Work Department's list of facilities offering this level of care within the geographic area requested by the patient (or if unable, by the patient's family).  Yes   Patient/family informed of their freedom to choose among providers that offer the needed level of care, that participate in Medicare, Medicaid or managed care program needed by the patient, have an available bed and are willing to accept the patient.  Yes   Patient/family informed of [facility]'s ownership interest in [facility] and [facility], as well as of the fact that they are under no obligation to receive care at these facilities.  PASRR submitted to EDS on 04/01/16     PASRR number received on       Existing PASRR number confirmed on 04/01/16     FL2 transmitted to all facilities in geographic area requested by pt/family on 04/01/16     FL2 transmitted to all facilities within larger geographic area on       Patient informed that his/her managed care company has contracts with or will negotiate with certain facilities, including the following:        Yes   Patient/family informed of bed offers received.  [facility]     Physician recommends and patient chooses bed at      Patient to be transferred to [facility] on 04/01/16.  Patient to be transferred to facility by Ambulance     Patient family notified on 04/01/16 of transfer.  Name of family member notified:  Hernendez,Janice     PHYSICIAN Please prepare priority discharge summary,  including medications, Please prepare prescriptions, Please sign FL2     Additional Comment:  Per MD patient is ready to discharge to . RN, patient, patient's family, and facility notified of discharge. RN given phone number for report and transport packet is on patient's chart. Ambulance transport requested. CSW signing off.   _______________________________________________ Reggy EyeLaShonda A Maryn Freelove, LCSW 04/01/2016, 2:39 PM

## 2016-04-01 NOTE — NC FL2 (Signed)
[place] MEDICAID FL2 LEVEL OF CARE SCREENING TOOL     IDENTIFICATION  Patient Name: Dana [name] Birthdate: 1933-06-08 Sex: female Admission Date (Current Location): 03/24/2016  [facility] and [place] Number:  Chiropodist and Address:  [facility], [address], [place], [place] [address]      Provider Number: [id number]  Attending Physician Name and Address:  Alba Cory, MD  Relative Name and Phone Number:       Current Level of Care: Hospital Recommended Level of Care: Skilled Nursing Facility Prior Approval Number:    Date Approved/Denied:   PASRR Number: [id number]  Discharge Plan: SNF    Current Diagnoses: Patient Active Problem List   Diagnosis Date Noted  . Respiratory failure (HCC) 03/24/2016  . CAP (community acquired pneumonia)   . Demand ischemia (HCC)     Orientation RESPIRATION BLADDER Height & Weight     Self, Time, Situation, Place  O2 (3 L/min) Continent Weight: 114 lb (51.7 kg) Height:  4\' 7"  (139.7 cm)  BEHAVIORAL SYMPTOMS/MOOD NEUROLOGICAL BOWEL NUTRITION STATUS   (none)  (None) Continent Diet (Dysphagia 2, Nectar Thick )  AMBULATORY STATUS COMMUNICATION OF NEEDS Skin   Limited Assist Verbally                         Personal Care Assistance Level of Assistance  Bathing, Feeding, Dressing Bathing Assistance: Limited assistance Feeding assistance: Independent Dressing Assistance: Limited assistance     Functional Limitations Info  Sight, Hearing, Speech Sight Info: Adequate Hearing Info: Adequate Speech Info: Adequate    SPECIAL CARE FACTORS FREQUENCY  PT (By licensed PT), OT (By licensed OT)     PT Frequency: 5/ week OT Frequency: 5/week            Contractures Contractures Info: Not present    Additional Factors Info  Code Status, Allergies Code Status Info: Full Allergies Info: NKDA           Current Medications (04/01/2016):  This is the current hospital active  medication list Current Facility-Administered Medications  Medication Dose Route Frequency Provider Last Rate Last Dose  . 0.9 %  sodium chloride infusion  250 mL Intravenous PRN Jose Alexis Frock, MD 10 mL/hr at 03/27/16 2000 250 mL at 03/27/16 2000  . acetaminophen (TYLENOL) tablet 650 mg  650 mg Oral Q4H PRN Jose Angelo A de Dios, MD      . albuterol (PROVENTIL) (2.5 MG/3ML) 0.083% nebulizer solution 2.5 mg  2.5 mg Nebulization Q4H PRN Belkys A Regalado, MD      . amiodarone (PACERONE) tablet 200 mg  200 mg Oral Daily Belkys A Regalado, MD   200 mg at 03/31/16 1139  . aspirin chewable tablet 81 mg  81 mg Oral Daily Bertram Millard, RPH   81 mg at 03/31/16 1139  . budesonide (PULMICORT) nebulizer solution 0.5 mg  0.5 mg Nebulization BID Jose Alexis Frock, MD   0.5 mg at 04/01/16 0721  . ceFEPIme (MAXIPIME) 2 g in dextrose 5 % 50 mL IVPB  2 g Intravenous Q24H Jose Alexis Frock, MD   2 g at 03/31/16 1818  . famotidine (PEPCID) tablet 20 mg  20 mg Oral QHS Belkys A Regalado, MD   20 mg at 03/31/16 2104  . feeding supplement (ENSURE ENLIVE) (ENSURE ENLIVE) liquid 237 mL  237 mL Oral BID BM Belkys A Regalado, MD   237  mL at 03/31/16 1621  . heparin injection 5,000 Units  5,000 Units Subcutaneous Q8H Jose Alexis FrockAngelo A de Dios, MD   5,000 Units at 04/01/16 [phone]  . insulin aspart (novoLOG) injection 0-9 Units  0-9 Units Subcutaneous TID WC Belkys A Regalado, MD   1 Units at 03/31/16 1139  . ipratropium-albuterol (DUONEB) 0.5-2.5 (3) MG/3ML nebulizer solution 3 mL  3 mL Nebulization BID Belkys A Regalado, MD   3 mL at 04/01/16 0721  . MEDLINE mouth rinse  15 mL Mouth Rinse BID Roslynn AmbleJennings E Nestor, MD   15 mL at 03/31/16 2106  . nicotine (NICODERM CQ - dosed in mg/24 hours) patch 21 mg  21 mg Transdermal Daily Jose Alexis FrockAngelo A de Dios, MD   21 mg at 03/31/16 1139  . polyethylene glycol (MIRALAX / GLYCOLAX) packet 17 g  17 g Oral Daily Belkys A Regalado, MD      . predniSONE (DELTASONE) tablet 40 mg  40  mg Oral Q breakfast Belkys A Regalado, MD   40 mg at 04/01/16 0755  . RESOURCE THICKENUP CLEAR   Oral PRN Belkys A Regalado, MD      . senna-docusate (Senokot-S) tablet 1 tablet  1 tablet Oral BID Belkys A Regalado, MD      . sodium phosphate (FLEET) 7-19 GM/118ML enema 1 enema  1 enema Rectal Once Belkys A Regalado, MD         Discharge Medications: Please see discharge summary for a list of discharge medications.  Relevant Imaging Results:  Relevant Lab Results:   Additional Information SSN: [id number]  Reggy EyeLaShonda A Langley Flatley, LCSW

## 2016-04-01 NOTE — Discharge Summary (Signed)
Physician Discharge Summary  Dana [name]:[id number] DOB: [birth year] DOA: 03/24/2016  PCP: No primary care provider on file.  Admit date: 03/24/2016 Discharge date: 04/01/2016  Admitted From: Home  Disposition:  SNF  Recommendations for Outpatient Follow-up:  1. Follow up with PCP in 1-2 weeks 2. Please obtain BMP/CBC in one week 3. Consider discontinue amiodarone if HR continue controlled.    Discharge Condition:Stable.  CODE STATUS: Full Code.  Diet recommendation: Dysphagia 2, Nectar Thick    Brief/Interim Summary: 82 year old female admitted to [facility] on 9/22 w/ working dx of CAP, AECOPD and Sepsis. She is on 2 liters Oxygen at baseline. She was treated in usual fashion which included: azith/roceph, systemic steroids, inhaled BDs and supplemental oxygen. Over the course of her stay she required escalating supplemental oxygen going from high-flow to BIPAP d/t increasing WOB. On the am of 9/25 the pt was desaturating further and also demonstrated increased CO2 retention w/ PCO2 of 92. CXR obtained that am raising concern for increasing edema. Was given IV lasix, but ultimately intubated. Her abx were widened to include: vanc and cefepime on 9/25, azithro was continued. She was accepted at [facility] but no beds were available so she was transferred to [facility].    Assessment & Plan:  1-Acute on Chronic Hypoxic and Hypercarbic Respiratory Failure - Secondary to Severe CAP &COPD -Exacerbation. Extubated 9/28.  Severe CAP Acute COPD Exacerbation  -Tobacco Use Disorder  -Small Bilateral Pleural Effusions - L>R -Patient still requiring 4 to 5 L of oxygen.  -Continue with schedule nebulizer treatments.  -Incentive Spirometry for pulmonary toilette -Repeated chest x ray 10-30: Stable small pleural effusions at each lung base, with probable adjacent atelectasis. Stable interstitial prominence bilaterally, likely mild interstitial edema superimposed on chronic interstitial lung  disease. -received IV lasix 09-30, no further lasix for now.  -respiratory status stable. Discharge on 5 days prednisone, nebulizer. Augmentin for 3 more days.   2-Severe CAP Continue with cefepime.  Day 8. Follow WBC.  WCB decrease, suspect elevation secondary to steroids, afebrile, clinically improving.  Discharge on Augmentin 3 more days.   A fib - RVR overnight 9/27. Resolved w/ Amio.  EKG. Now sinus.  Now on oral amiodarone, need taper off out patient.   AKI;  Hold lasix. repeat labs in am/  Will give another  dose of kayexalate.  repeat B-met tomorrow.  Renal function stable.   Leukocytosis;  In setting of infection and steroids.  Continue with IV antibiotics.  Afebrile, improving clinically.   Protein-Calorie Malnutrition, nutrition;  Started on dysphagia 2 diet  Need repeat Swallow evaluation.   Elevated Troponin I - Likely demand ishcemia. No wall motion on TTE.  Shock - Sepsis versus cardiac in etiology. Resolved off sedation.   Discharge Diagnoses:  Active Problems:   Respiratory failure ([place])   CAP (community acquired pneumonia)   Demand ischemia [facility])    Discharge Instructions  Discharge Instructions    Diet - low sodium heart healthy    Complete by:  As directed    Increase activity slowly    Complete by:  As directed        Medication List    STOP taking these medications   hydrochlorothiazide 25 MG tablet Commonly known as:  HYDRODIURIL   ibuprofen 200 MG tablet Commonly known as:  ADVIL,MOTRIN   lisinopril 20 MG tablet Commonly known as:  PRINIVIL,ZESTRIL     TAKE these medications   acetaminophen 325 MG tablet Commonly known as:  TYLENOL Take 325-650 mg by mouth every 6 (six) hours as needed for headache.   albuterol 108 (90 Base) MCG/ACT inhaler Commonly known as:  PROVENTIL HFA;VENTOLIN HFA Inhale 2 puffs into the lungs every 6 (six) hours as needed for wheezing.   amiodarone 200 MG tablet Commonly known as:   PACERONE Take 1 tablet (200 mg total) by mouth daily.   amoxicillin-clavulanate 875-125 MG tablet Commonly known as:  AUGMENTIN Take 1 tablet by mouth 2 (two) times daily.   aspirin 81 MG chewable tablet Chew 1 tablet (81 mg total) by mouth daily.   atorvastatin 40 MG tablet Commonly known as:  LIPITOR Take 40 mg by mouth every evening.   budesonide 0.5 MG/2ML nebulizer solution Commonly known as:  PULMICORT Take 2 mLs (0.5 mg total) by nebulization 2 (two) times daily.   famotidine 20 MG tablet Commonly known as:  PEPCID Take 1 tablet (20 mg total) by mouth at bedtime.   feeding supplement (ENSURE ENLIVE) Liqd Take 237 mLs by mouth 2 (two) times daily between meals.   ipratropium-albuterol 0.5-2.5 (3) MG/3ML Soln Commonly known as:  DUONEB Take 3 mLs by nebulization every 6 (six) hours.   mouth rinse Liqd solution 15 mLs by Mouth Rinse route 2 (two) times daily.   nicotine 21 mg/24hr patch Commonly known as:  NICODERM CQ - dosed in mg/24 hours Place 1 patch (21 mg total) onto the skin daily.   predniSONE 20 MG tablet Commonly known as:  DELTASONE Take 40 mg for 5 days.   ranitidine 150 MG tablet Commonly known as:  ZANTAC Take 150 mg by mouth daily as needed for heartburn.   saccharomyces boulardii 250 MG capsule Commonly known as:  FLORASTOR Take 1 capsule (250 mg total) by mouth 2 (two) times daily.   senna-docusate 8.6-50 MG tablet Commonly known as:  Senokot-S Take 1 tablet by mouth 2 (two) times daily.       No Known Allergies  Consultations:  CCM admitted patient.   Procedures/Studies: Dg Chest 1 View  Result Date: 03/29/2016 CLINICAL DATA:  Hypoxia. Initially admitted with pneumonia and sepsis superimposed on COPD, extubated on 03/27/2016. EXAM: CHEST 1 VIEW COMPARISON:  Chest x-rays dated 03/26/2016, 03/25/2016 and 03/21/2016. FINDINGS: Cardiomediastinal silhouette is stable. Atherosclerotic change noted at the aortic arch. Again noted are  small layering pleural effusions at each lung base. Mild interstitial prominence persists. No new lung findings. IMPRESSION: 1. Stable small pleural effusions at each lung base, with probable adjacent atelectasis. 2. Stable interstitial prominence bilaterally, likely mild interstitial edema superimposed on chronic interstitial lung disease. 3. No new lung findings on today's exam. 4. Aortic atherosclerosis. Electronically Signed   By: Franki Cabot M.D.   On: 03/29/2016 13:43   Dg Chest Port 1 View  Result Date: 03/26/2016 CLINICAL DATA:  Hypoxia EXAM: PORTABLE CHEST 1 VIEW COMPARISON:  March 25, 2016 FINDINGS: Endotracheal tube tip is 4.8 cm above the carina. Nasogastric tube tip and side port are below the diaphragm. No pneumothorax. There are small pleural effusions bilaterally with interstitial edema and cardiomegaly. The pulmonary vascularity appears within normal limits. There is patchy airspace consolidation in the left lower lobe, stable. There is atherosclerotic calcification in the aorta. No adenopathy. IMPRESSION: Tube positions as described without pneumothorax. Evidence of a degree of underlying congestive heart failure. Question alveolar edema versus pneumonia left lower lobe; both entities may exist concurrently. Stable cardiac silhouette. There is aortic atherosclerosis. Electronically Signed   By: Lowella Grip III M.D.  On: 03/26/2016 07:15   Dg Chest Port 1 View  Result Date: 03/25/2016 CLINICAL DATA:  Ventilator dependent respiratory failure. Follow-up CHF and effusions. EXAM: PORTABLE CHEST 1 VIEW COMPARISON:  03/24/2016, 03/21/2016 and earlier. FINDINGS: Endotracheal tube tip in satisfactory position projecting approximately 5 cm above carina. Nasogastric tube courses below the diaphragm into the stomach. Cardiac silhouette mildly to moderately enlarged, unchanged. Thoracic aorta atherosclerotic, unchanged. Pulmonary venous hypertension and mild interstitial pulmonary edema,  slightly improved since yesterday. Stable small bilateral pleural effusions, left greater than right. Stable dense consolidation in the left lower lobe. Stable mild right basilar atelectasis. No new pulmonary parenchymal abnormalities. IMPRESSION: 1.  Support apparatus satisfactory. 2. Persistent mild CHF with slight improvement since yesterday. 3. Stable small bilateral pleural effusions, left greater than right. Stable dense left lower lobe atelectasis and pneumonia. Stable mild right basilar atelectasis. No new abnormalities. Electronically Signed   By: Evangeline Dakin M.D.   On: 03/25/2016 08:19   Dg Chest Port 1 View  Result Date: 03/24/2016 CLINICAL DATA:  Respiratory failure EXAM: PORTABLE CHEST 1 VIEW COMPARISON:  03/24/2016 FINDINGS: There is an ET tube with tip above the carina. Aortic atherosclerosis noted. Nasogastric tube tip is in the stomach. Heart size is normal. Bilateral pleural effusions are identified left greater than right. Diffuse pulmonary edema. IMPRESSION: Moderate CHF pattern is unchanged compared with previous exam. Electronically Signed   By: Kerby Moors M.D.   On: 03/24/2016 18:02   Dg Swallowing Func-speech Pathology  Result Date: 03/29/2016 Objective Swallowing Evaluation: Type of Study: MBS-Modified Barium Swallow Study Patient Details Name: Dana [name] MRN: [id number] Date of Birth: 03/23/1933 Today's Date: 03/29/2016 Time: SLP Start Time (ACUTE ONLY): 1238-SLP Stop Time (ACUTE ONLY): 1251 SLP Time Calculation (min) (ACUTE ONLY): 13 min Past Medical History: No past medical history on file. Past Surgical History: No past surgical history on file. HPI: 82 year old female admitted to [facility] on 9/22 w/ working dx of CAP, AECOPD and Sepsis. She continued to decline with worsening WOB, hypercapnia,a nd possible pulmonary edema requiring intubation 9/25-9/28. She was transferred to [facility] 9/25. She is on 2 liters Oxygen at baseline.  Subjective: pt alert, says she had  trouble with pills getting "stuck" PTA Assessment / Plan / Recommendation CHL IP CLINICAL IMPRESSIONS 03/29/2016 Therapy Diagnosis Mild oral phase dysphagia;Mild pharyngeal phase dysphagia Clinical Impression Pt has a mild oropharyngeal dysphagia with decreased bolus cohesion and mild delay in swallow initiation. She also has reduced hyolarngeal movement and epiglottic deflection. As a result of the above, pt has frank penetration of thin liquids that is in smaller quantities, but is consistent and silent, increasing her risk of developing an aspiration-related infection. She has improved airway protection with thicker liquids and solids, although with solids she does have impaired mastication due to her lack of dentition. Recommend Dys 2 diet and nectar thick liquids. Impact on safety and function Mild aspiration risk;Moderate aspiration risk   CHL IP TREATMENT RECOMMENDATION 03/29/2016 Treatment Recommendations Therapy as outlined in treatment plan below   Prognosis 03/29/2016 Prognosis for Safe Diet Advancement Good Barriers to Reach Goals -- Barriers/Prognosis Comment -- CHL IP DIET RECOMMENDATION 03/29/2016 SLP Diet Recommendations Dysphagia 2 (Fine chop) solids;Nectar thick liquid Liquid Administration via Cup;Straw Medication Administration Whole meds with puree Compensations Slow rate;Small sips/bites Postural Changes Seated upright at 90 degrees   CHL IP OTHER RECOMMENDATIONS 03/29/2016 Recommended Consults -- Oral Care Recommendations Oral care BID Other Recommendations Order thickener from pharmacy;Prohibited food (jello, ice cream, thin  soups);Remove water pitcher   CHL IP FOLLOW UP RECOMMENDATIONS 03/29/2016 Follow up Recommendations (No Data)   CHL IP FREQUENCY AND DURATION 03/29/2016 Speech Therapy Frequency (ACUTE ONLY) min 2x/week Treatment Duration 2 weeks      CHL IP ORAL PHASE 03/29/2016 Oral Phase Impaired Oral - Pudding Teaspoon -- Oral - Pudding Cup -- Oral - Honey Teaspoon -- Oral - Honey Cup -- Oral  - Nectar Teaspoon -- Oral - Nectar Cup Decreased bolus cohesion Oral - Nectar Straw Decreased bolus cohesion Oral - Thin Teaspoon -- Oral - Thin Cup Decreased bolus cohesion Oral - Thin Straw -- Oral - Puree Decreased bolus cohesion Oral - Mech Soft -- Oral - Regular Decreased bolus cohesion;Impaired mastication Oral - Multi-Consistency -- Oral - Pill -- Oral Phase - Comment --  CHL IP PHARYNGEAL PHASE 03/29/2016 Pharyngeal Phase Impaired Pharyngeal- Pudding Teaspoon -- Pharyngeal -- Pharyngeal- Pudding Cup -- Pharyngeal -- Pharyngeal- Honey Teaspoon -- Pharyngeal -- Pharyngeal- Honey Cup -- Pharyngeal -- Pharyngeal- Nectar Teaspoon -- Pharyngeal -- Pharyngeal- Nectar Cup Delayed swallow initiation-vallecula;Reduced anterior laryngeal mobility;Reduced laryngeal elevation;Reduced airway/laryngeal closure Pharyngeal -- Pharyngeal- Nectar Straw Delayed swallow initiation-vallecula;Reduced anterior laryngeal mobility;Reduced laryngeal elevation;Reduced airway/laryngeal closure;Penetration/Aspiration during swallow Pharyngeal Material enters airway, remains ABOVE vocal cords then ejected out Pharyngeal- Thin Teaspoon -- Pharyngeal -- Pharyngeal- Thin Cup Delayed swallow initiation-vallecula;Reduced anterior laryngeal mobility;Reduced laryngeal elevation;Reduced airway/laryngeal closure;Penetration/Aspiration before swallow Pharyngeal Material enters airway, remains ABOVE vocal cords and not ejected out Pharyngeal- Thin Straw -- Pharyngeal -- Pharyngeal- Puree Delayed swallow initiation-vallecula;Reduced anterior laryngeal mobility;Reduced laryngeal elevation;Reduced airway/laryngeal closure Pharyngeal -- Pharyngeal- Mechanical Soft -- Pharyngeal -- Pharyngeal- Regular Delayed swallow initiation-vallecula;Reduced anterior laryngeal mobility;Reduced laryngeal elevation;Reduced airway/laryngeal closure Pharyngeal -- Pharyngeal- Multi-consistency -- Pharyngeal -- Pharyngeal- Pill -- Pharyngeal -- Pharyngeal Comment --  CHL IP  CERVICAL ESOPHAGEAL PHASE 03/29/2016 Cervical Esophageal Phase WFL Pudding Teaspoon -- Pudding Cup -- Honey Teaspoon -- Honey Cup -- Nectar Teaspoon -- Nectar Cup -- Nectar Straw -- Thin Teaspoon -- Thin Cup -- Thin Straw -- Puree -- Mechanical Soft -- Regular -- Multi-consistency -- Pill -- Cervical Esophageal Comment -- No flowsheet data found. Germain Osgood 03/29/2016, 2:03 PM  Germain Osgood, M.A. CCC-SLP [phone]               Subjective: Feeling better. Breathing much better   Discharge Exam: Vitals:   03/31/16 2117 04/01/16 0439  BP: (!) 118/52 127/63  Pulse:  69  Resp: 18 18  Temp: 98.2 F (36.8 C) 98.3 F (36.8 C)   Vitals:   03/31/16 2117 04/01/16 0439 04/01/16 0721 04/01/16 0724  BP: (!) 118/52 127/63    Pulse:  69    Resp: 18 18    Temp: 98.2 F (36.8 C) 98.3 F (36.8 C)    TempSrc: Oral Oral    SpO2: 96% 100% 99% 99%  Weight:  51.7 kg (114 lb)    Height:        General: Pt is alert, awake, not in acute distress Cardiovascular: RRR, S1/S2 +, no rubs, no gallops Respiratory: CTA bilaterally, no wheezing, no rhonchi Abdominal: Soft, NT, ND, bowel sounds + Extremities: no edema, no cyanosis    The results of significant diagnostics from this hospitalization (including imaging, microbiology, ancillary and laboratory) are listed below for reference.     Microbiology: Recent Results (from the past 240 hour(s))  MRSA PCR Screening     Status: None   Collection Time: 03/24/16  4:31 PM  Result Value Ref Range Status   MRSA by  PCR NEGATIVE NEGATIVE Final    Comment:        The GeneXpert MRSA Assay (FDA approved for NASAL specimens only), is one component of a comprehensive MRSA colonization surveillance program. It is not intended to diagnose MRSA infection nor to guide or monitor treatment for MRSA infections.   Culture, respiratory (tracheal aspirate)     Status: None   Collection Time: 03/24/16  4:46 PM  Result Value Ref Range Status    Specimen Description TRACHEAL ASPIRATE  Final   Special Requests NONE  Final   Gram Stain   Final    MODERATE WBC PRESENT, PREDOMINANTLY PMN RARE GRAM NEGATIVE RODS    Culture NO GROWTH 2 DAYS  Final   Report Status 03/26/2016 FINAL  Final  Culture, blood (routine x 2)     Status: None   Collection Time: 03/24/16  6:17 PM  Result Value Ref Range Status   Specimen Description BLOOD RIGHT HAND  Final   Special Requests IN PEDIATRIC BOTTLE  [place]  Final   Culture NO GROWTH 5 DAYS  Final   Report Status 03/29/2016 FINAL  Final  Culture, blood (routine x 2)     Status: None   Collection Time: 03/24/16  6:28 PM  Result Value Ref Range Status   Specimen Description BLOOD LEFT HAND  Final   Special Requests IN PEDIATRIC BOTTLE  [place]  Final   Culture NO GROWTH 5 DAYS  Final   Report Status 03/29/2016 FINAL  Final     Labs: BNP (last 3 results) No results for input(s): BNP in the last 8760 hours. Basic Metabolic Panel:  Recent Labs Lab 03/28/16 0142 03/29/16 0406 03/30/16 0119 03/30/16 0914 03/31/16 0220 03/31/16 1209 04/01/16 0152  NA 140 139 135 139 137  --  140  K 4.3 4.8 5.7* 4.6 5.2* 5.0 5.0  CL 102 102 99* 97* 97*  --  100*  CO2 34* 32 31 36* 35*  --  36*  GLUCOSE 96 76 78 128* 121*  --  103*  BUN 20 21* 24* 22* 24*  --  31*  CREATININE 0.78 0.80 0.89 0.89 1.05*  --  0.96  CALCIUM 8.3* 8.6* 8.4* 9.0 8.8*  --  8.5*  MG 2.3 2.2 2.2  --  2.2  --  2.2  PHOS 2.9 3.4 3.3  --  2.9  --  3.5   Liver Function Tests:  Recent Labs Lab 03/28/16 0142 03/29/16 0406 03/30/16 0119 03/31/16 0220 04/01/16 0152  ALBUMIN 1.9* 2.0* 2.2* 2.4* 2.4*   No results for input(s): LIPASE, AMYLASE in the last 168 hours. No results for input(s): AMMONIA in the last 168 hours. CBC:  Recent Labs Lab 03/28/16 0142 03/29/16 0406 03/30/16 0119 03/31/16 0220 04/01/16 0152  WBC 22.9* 18.6* 19.9* 24.9* 23.9*  NEUTROABS 16.9* 14.5* 17.7* 22.1* 20.5*  HGB 9.2* 9.1* 9.4* 10.0* 9.6*  HCT 30.6*  30.0* 29.8* 32.9* 32.2*  MCV 93.9 93.8 92.0 94.5 95.8  PLT 229 219 243 263 285   Cardiac Enzymes:  Recent Labs Lab 03/26/16 0706 03/26/16 2030 03/27/16 1143 03/27/16 1633 03/27/16 2241  TROPONINI 0.09* 0.07* 0.05* 0.05* 0.05*   BNP: Invalid input(s): POCBNP CBG:  Recent Labs Lab 03/31/16 0840 03/31/16 1120 03/31/16 1623 03/31/16 2110 04/01/16 0630  GLUCAP 130* 135* 120* 144* 82   D-Dimer No results for input(s): DDIMER in the last 72 hours. Hgb A1c No results for input(s): HGBA1C in the last 72 hours. Lipid Profile No results for input(s):  CHOL, HDL, LDLCALC, TRIG, CHOLHDL, LDLDIRECT in the last 72 hours. Thyroid function studies No results for input(s): TSH, T4TOTAL, T3FREE, THYROIDAB in the last 72 hours.  Invalid input(s): FREET3 Anemia work up No results for input(s): VITAMINB12, FOLATE, FERRITIN, TIBC, IRON, RETICCTPCT in the last 72 hours. Urinalysis No results found for: COLORURINE, APPEARANCEUR, [place], [place], [place], [place], [place], [place], PROTEINUR, UROBILINOGEN, NITRITE, LEUKOCYTESUR Sepsis Labs Invalid input(s): PROCALCITONIN,  WBC,  LACTICIDVEN Microbiology Recent Results (from the past 240 hour(s))  MRSA PCR Screening     Status: None   Collection Time: 03/24/16  4:31 PM  Result Value Ref Range Status   MRSA by PCR NEGATIVE NEGATIVE Final    Comment:        The GeneXpert MRSA Assay (FDA approved for NASAL specimens only), is one component of a comprehensive MRSA colonization surveillance program. It is not intended to diagnose MRSA infection nor to guide or monitor treatment for MRSA infections.   Culture, respiratory (tracheal aspirate)     Status: None   Collection Time: 03/24/16  4:46 PM  Result Value Ref Range Status   Specimen Description TRACHEAL ASPIRATE  Final   Special Requests NONE  Final   Gram Stain   Final    MODERATE WBC PRESENT, PREDOMINANTLY PMN RARE GRAM NEGATIVE RODS    Culture NO GROWTH 2 DAYS  Final    Report Status 03/26/2016 FINAL  Final  Culture, blood (routine x 2)     Status: None   Collection Time: 03/24/16  6:17 PM  Result Value Ref Range Status   Specimen Description BLOOD RIGHT HAND  Final   Special Requests IN PEDIATRIC BOTTLE  [place]  Final   Culture NO GROWTH 5 DAYS  Final   Report Status 03/29/2016 FINAL  Final  Culture, blood (routine x 2)     Status: None   Collection Time: 03/24/16  6:28 PM  Result Value Ref Range Status   Specimen Description BLOOD LEFT HAND  Final   Special Requests IN PEDIATRIC BOTTLE  [place]  Final   Culture NO GROWTH 5 DAYS  Final   Report Status 03/29/2016 FINAL  Final     Time coordinating discharge: Over 30 minutes  SIGNED:   Elmarie Shiley, MD  [facility] 04/01/2016, 9:58 AM Pager [phone]  If 7PM-7AM, please contact night-coverage [email or web address] Password [email or web address]

## 2016-04-01 NOTE — Progress Notes (Signed)
Pt discharged via PTAR. Pt belongings sent with pt and daughter. Report called to SNF. All questions answered. IV and telemetry removed. VSS prior to discharge.  Storm Friskaitlin S [place]

## 2016-04-01 NOTE — Care Management Note (Signed)
Case Management Note Previous CM note initiated by Dana [name], Dana S, RN 03/28/2016, 4:25 PM  Patient Details  Name: Dana [name] MRN: [id number] Date of Birth: 05/16/1933  Subjective/Objective:        Pt admitted with resp failure- possibly secondary to aspiration            Action/Plan:  PTA pt was staying with 2 adult daughters, on home oxygen .  Pt extremely deconditioned - verified by bedside nurse.  CM requested PT eval once medically stable (pt desats into 80'[name] on supplemental oxygen ) with slight movement.  CM will continue to follow for discharge needs.   Pt tx from ICU-67M to 2W on 03/28/16  Expected Discharge Date:                  Expected Discharge Plan:  Home w Home Health Services  In-House Referral:  Clinical Social Work  Discharge planning Services  CM Consult  Post Acute Care Choice:  Home Health Choice offered to:  Patient, Adult Children  DME Arranged:    DME Agency:     HH Arranged:  RN, OT HH Agency:     Status of Service:  Completed, signed off  If discussed at Long Length of Stay Meetings, dates discussed:    Discharge Disposition: skilled facility   Additional Comments:  04/26/16- 1000- Dana Down RN, CM-  In f/u pt and daughter have decided to do STSNF- CSW to see them again for placement needs- [facility]- plan to d/c to SNF later today.   03/31/16- 1600- Dana Favorite RN, CM- PT recommendation for SNF- per CSW pt and daughter would like to return home- HH orders for RN/PT have been placed- spoke with pt and daughter at bedside- [facility] [place]- daughter would like to look over list overnight-CM will f/u in am prior to d/c for choice- per daughter pt has home 02 with [facility]- willl bring tank from home for transport home in am-   Darrold SpanWebster, Dana Lipps Hall, RN 04/01/2016, 11:32 AM [phone]

## 2017-05-26 ENCOUNTER — Encounter: Payer: Self-pay | Admitting: Emergency Medicine

## 2017-05-26 ENCOUNTER — Inpatient Hospital Stay
Admission: EM | Admit: 2017-05-26 | Discharge: 2017-06-01 | DRG: 190 | Disposition: A | Discharge status: Home / Self Care | Payer: Medicare Other | Attending: Specialist | Admitting: Specialist

## 2017-05-26 ENCOUNTER — Emergency Department: Payer: Medicare Other

## 2017-05-26 ENCOUNTER — Other Ambulatory Visit: Payer: Self-pay

## 2017-05-26 ENCOUNTER — Inpatient Hospital Stay: Payer: Medicare Other

## 2017-05-26 DIAGNOSIS — F17210 Nicotine dependence, cigarettes, uncomplicated: Secondary | ICD-10-CM | POA: Diagnosis present

## 2017-05-26 DIAGNOSIS — A419 Sepsis, unspecified organism: Secondary | ICD-10-CM | POA: Diagnosis present

## 2017-05-26 DIAGNOSIS — J441 Chronic obstructive pulmonary disease with (acute) exacerbation: Principal | ICD-10-CM | POA: Diagnosis present

## 2017-05-26 DIAGNOSIS — R680 Hypothermia, not associated with low environmental temperature: Secondary | ICD-10-CM | POA: Diagnosis present

## 2017-05-26 DIAGNOSIS — E785 Hyperlipidemia, unspecified: Secondary | ICD-10-CM | POA: Diagnosis present

## 2017-05-26 DIAGNOSIS — Z79899 Other long term (current) drug therapy: Secondary | ICD-10-CM | POA: Diagnosis not present

## 2017-05-26 DIAGNOSIS — L03116 Cellulitis of left lower limb: Secondary | ICD-10-CM | POA: Diagnosis present

## 2017-05-26 DIAGNOSIS — Z7982 Long term (current) use of aspirin: Secondary | ICD-10-CM | POA: Diagnosis not present

## 2017-05-26 DIAGNOSIS — I509 Heart failure, unspecified: Secondary | ICD-10-CM | POA: Diagnosis not present

## 2017-05-26 DIAGNOSIS — J9622 Acute and chronic respiratory failure with hypercapnia: Secondary | ICD-10-CM | POA: Diagnosis not present

## 2017-05-26 DIAGNOSIS — K219 Gastro-esophageal reflux disease without esophagitis: Secondary | ICD-10-CM | POA: Diagnosis present

## 2017-05-26 DIAGNOSIS — R0602 Shortness of breath: Secondary | ICD-10-CM | POA: Diagnosis present

## 2017-05-26 DIAGNOSIS — Z515 Encounter for palliative care: Secondary | ICD-10-CM

## 2017-05-26 DIAGNOSIS — R600 Localized edema: Secondary | ICD-10-CM

## 2017-05-26 DIAGNOSIS — L03119 Cellulitis of unspecified part of limb: Secondary | ICD-10-CM

## 2017-05-26 DIAGNOSIS — I878 Other specified disorders of veins: Secondary | ICD-10-CM | POA: Diagnosis present

## 2017-05-26 DIAGNOSIS — J9690 Respiratory failure, unspecified, unspecified whether with hypoxia or hypercapnia: Secondary | ICD-10-CM

## 2017-05-26 DIAGNOSIS — I480 Paroxysmal atrial fibrillation: Secondary | ICD-10-CM | POA: Diagnosis present

## 2017-05-26 DIAGNOSIS — J810 Acute pulmonary edema: Secondary | ICD-10-CM | POA: Diagnosis not present

## 2017-05-26 DIAGNOSIS — Z9981 Dependence on supplemental oxygen: Secondary | ICD-10-CM

## 2017-05-26 DIAGNOSIS — Z7189 Other specified counseling: Secondary | ICD-10-CM | POA: Diagnosis not present

## 2017-05-26 DIAGNOSIS — I959 Hypotension, unspecified: Secondary | ICD-10-CM | POA: Diagnosis present

## 2017-05-26 DIAGNOSIS — J9602 Acute respiratory failure with hypercapnia: Secondary | ICD-10-CM | POA: Diagnosis not present

## 2017-05-26 DIAGNOSIS — J9601 Acute respiratory failure with hypoxia: Secondary | ICD-10-CM

## 2017-05-26 DIAGNOSIS — Z825 Family history of asthma and other chronic lower respiratory diseases: Secondary | ICD-10-CM | POA: Diagnosis not present

## 2017-05-26 DIAGNOSIS — G629 Polyneuropathy, unspecified: Secondary | ICD-10-CM | POA: Diagnosis present

## 2017-05-26 DIAGNOSIS — L03115 Cellulitis of right lower limb: Secondary | ICD-10-CM | POA: Diagnosis present

## 2017-05-26 DIAGNOSIS — Z7951 Long term (current) use of inhaled steroids: Secondary | ICD-10-CM

## 2017-05-26 DIAGNOSIS — J449 Chronic obstructive pulmonary disease, unspecified: Secondary | ICD-10-CM | POA: Diagnosis not present

## 2017-05-26 DIAGNOSIS — J9621 Acute and chronic respiratory failure with hypoxia: Secondary | ICD-10-CM | POA: Diagnosis present

## 2017-05-26 DIAGNOSIS — I5033 Acute on chronic diastolic (congestive) heart failure: Secondary | ICD-10-CM | POA: Diagnosis present

## 2017-05-26 DIAGNOSIS — T68XXXA Hypothermia, initial encounter: Secondary | ICD-10-CM

## 2017-05-26 HISTORY — DX: Heart failure, unspecified: I50.9

## 2017-05-26 LAB — URINALYSIS, COMPLETE (UACMP) WITH MICROSCOPIC
Bacteria, UA: NONE SEEN
Bilirubin Urine: NEGATIVE
Glucose, UA: NEGATIVE mg/dL
Hgb urine dipstick: NEGATIVE
KETONES UR: NEGATIVE mg/dL
Leukocytes, UA: NEGATIVE
Nitrite: NEGATIVE
PH: 5 (ref 5.0–8.0)
Protein, ur: 30 mg/dL — AB
SQUAMOUS EPITHELIAL / LPF: NONE SEEN
Specific Gravity, Urine: 1.018 (ref 1.005–1.030)

## 2017-05-26 LAB — CBC WITH DIFFERENTIAL/PLATELET
Basophils Absolute: 0 10*3/uL (ref 0–0.1)
Basophils Relative: 1 %
EOS PCT: 1 %
Eosinophils Absolute: 0.1 10*3/uL (ref 0–0.7)
HCT: 34.2 % — ABNORMAL LOW (ref 35.0–47.0)
Hemoglobin: 10.8 g/dL — ABNORMAL LOW (ref 12.0–16.0)
LYMPHS ABS: 0.6 10*3/uL — AB (ref 1.0–3.6)
Lymphocytes Relative: 6 %
MCH: 29 pg (ref 26.0–34.0)
MCHC: 31.7 g/dL — AB (ref 32.0–36.0)
MCV: 91.4 fL (ref 80.0–100.0)
MONO ABS: 0.9 10*3/uL (ref 0.2–0.9)
MONOS PCT: 10 %
Neutro Abs: 7.5 10*3/uL — ABNORMAL HIGH (ref 1.4–6.5)
Neutrophils Relative %: 82 %
PLATELETS: 190 10*3/uL (ref 150–440)
RBC: 3.74 MIL/uL — ABNORMAL LOW (ref 3.80–5.20)
RDW: 15.1 % — AB (ref 11.5–14.5)
WBC: 9 10*3/uL (ref 3.6–11.0)

## 2017-05-26 LAB — COMPREHENSIVE METABOLIC PANEL
ALT: 13 U/L — AB (ref 14–54)
ANION GAP: 14 (ref 5–15)
AST: 18 U/L (ref 15–41)
Albumin: 3.1 g/dL — ABNORMAL LOW (ref 3.5–5.0)
Alkaline Phosphatase: 89 U/L (ref 38–126)
BUN: 19 mg/dL (ref 6–20)
CO2: 37 mmol/L — ABNORMAL HIGH (ref 22–32)
CREATININE: 0.87 mg/dL (ref 0.44–1.00)
Calcium: 9.1 mg/dL (ref 8.9–10.3)
Chloride: 91 mmol/L — ABNORMAL LOW (ref 101–111)
Glucose, Bld: 130 mg/dL — ABNORMAL HIGH (ref 65–99)
Potassium: 3.5 mmol/L (ref 3.5–5.1)
Sodium: 142 mmol/L (ref 135–145)
Total Bilirubin: 0.6 mg/dL (ref 0.3–1.2)
Total Protein: 6.9 g/dL (ref 6.5–8.1)

## 2017-05-26 LAB — BLOOD GAS, VENOUS
Acid-Base Excess: 17.4 mmol/L — ABNORMAL HIGH (ref 0.0–2.0)
Bicarbonate: 49.8 mmol/L — ABNORMAL HIGH (ref 20.0–28.0)
O2 Saturation: UNDETERMINED %
PCO2 VEN: 106 mmHg — AB (ref 44.0–60.0)
PO2 VEN: UNDETERMINED mmHg (ref 32.0–45.0)
Patient temperature: 37
pH, Ven: 7.28 (ref 7.250–7.430)

## 2017-05-26 LAB — TROPONIN I

## 2017-05-26 LAB — TSH: TSH: 2.1 u[IU]/mL (ref 0.350–4.500)

## 2017-05-26 LAB — PROTIME-INR
INR: 0.9
PROTHROMBIN TIME: 12.1 s (ref 11.4–15.2)

## 2017-05-26 LAB — APTT: aPTT: 32 seconds (ref 24–36)

## 2017-05-26 LAB — INFLUENZA PANEL BY PCR (TYPE A & B)
INFLAPCR: NEGATIVE
Influenza B By PCR: NEGATIVE

## 2017-05-26 LAB — LACTIC ACID, PLASMA: LACTIC ACID, VENOUS: 1.8 mmol/L (ref 0.5–1.9)

## 2017-05-26 LAB — GLUCOSE, CAPILLARY: Glucose-Capillary: 125 mg/dL — ABNORMAL HIGH (ref 65–99)

## 2017-05-26 LAB — MRSA PCR SCREENING: MRSA by PCR: POSITIVE — AB

## 2017-05-26 LAB — BRAIN NATRIURETIC PEPTIDE: B NATRIURETIC PEPTIDE 5: 268 pg/mL — AB (ref 0.0–100.0)

## 2017-05-26 MED ORDER — ASPIRIN 81 MG PO CHEW
81.0000 mg | CHEWABLE_TABLET | Freq: Every day | ORAL | Status: DC
  Administered 2017-05-27 – 2017-06-01 (×6): 81 mg via ORAL
  Filled 2017-05-26 (×6): qty 1

## 2017-05-26 MED ORDER — ENSURE ENLIVE PO LIQD
237.0000 mL | Freq: Two times a day (BID) | ORAL | Status: DC
  Administered 2017-05-27 – 2017-06-01 (×9): 237 mL via ORAL

## 2017-05-26 MED ORDER — VANCOMYCIN HCL IN DEXTROSE 1-5 GM/200ML-% IV SOLN
1000.0000 mg | Freq: Once | INTRAVENOUS | Status: AC
  Administered 2017-05-26: 1000 mg via INTRAVENOUS
  Filled 2017-05-26: qty 200

## 2017-05-26 MED ORDER — ACETAMINOPHEN 325 MG PO TABS: 650.0000 mg | ORAL_TABLET | Freq: Four times a day (QID) | ORAL | Status: DC | PRN

## 2017-05-26 MED ORDER — ATORVASTATIN CALCIUM 20 MG PO TABS
40.0000 mg | ORAL_TABLET | Freq: Every evening | ORAL | Status: DC
  Administered 2017-05-27 – 2017-05-30 (×4): 40 mg via ORAL
  Filled 2017-05-26 (×5): qty 2

## 2017-05-26 MED ORDER — FAMOTIDINE 20 MG PO TABS
20.0000 mg | ORAL_TABLET | Freq: Every day | ORAL | Status: DC
  Administered 2017-05-26 – 2017-05-31 (×6): 20 mg via ORAL
  Filled 2017-05-26 (×6): qty 1

## 2017-05-26 MED ORDER — NICOTINE 21 MG/24HR TD PT24
21.0000 mg | MEDICATED_PATCH | Freq: Every day | TRANSDERMAL | Status: DC
  Filled 2017-05-26: qty 1

## 2017-05-26 MED ORDER — IPRATROPIUM-ALBUTEROL 0.5-2.5 (3) MG/3ML IN SOLN
3.0000 mL | Freq: Once | RESPIRATORY_TRACT | Status: AC
  Administered 2017-05-26: 3 mL via RESPIRATORY_TRACT
  Filled 2017-05-26: qty 3

## 2017-05-26 MED ORDER — AMIODARONE HCL 200 MG PO TABS
200.0000 mg | ORAL_TABLET | Freq: Every day | ORAL | Status: DC
  Administered 2017-05-27 – 2017-06-01 (×7): 200 mg via ORAL
  Filled 2017-05-26 (×7): qty 1

## 2017-05-26 MED ORDER — FUROSEMIDE 10 MG/ML IJ SOLN
60.0000 mg | Freq: Once | INTRAMUSCULAR | Status: AC
  Administered 2017-05-26: 60 mg via INTRAVENOUS
  Filled 2017-05-26: qty 8

## 2017-05-26 MED ORDER — IPRATROPIUM-ALBUTEROL 0.5-2.5 (3) MG/3ML IN SOLN
3.0000 mL | Freq: Four times a day (QID) | RESPIRATORY_TRACT | Status: DC
  Administered 2017-05-26 – 2017-06-01 (×24): 3 mL via RESPIRATORY_TRACT
  Filled 2017-05-26 (×24): qty 3

## 2017-05-26 MED ORDER — ORAL CARE MOUTH RINSE
15.0000 mL | Freq: Two times a day (BID) | OROMUCOSAL | Status: DC
  Administered 2017-05-27 – 2017-05-31 (×9): 15 mL via OROMUCOSAL

## 2017-05-26 MED ORDER — PIPERACILLIN-TAZOBACTAM 3.375 G IVPB 30 MIN
3.3750 g | Freq: Once | INTRAVENOUS | Status: AC
  Administered 2017-05-26: 3.375 g via INTRAVENOUS
  Filled 2017-05-26: qty 50

## 2017-05-26 MED ORDER — ACETAMINOPHEN 650 MG RE SUPP: 650.0000 mg | Freq: Four times a day (QID) | RECTAL | Status: DC | PRN

## 2017-05-26 MED ORDER — ONDANSETRON HCL 4 MG/2ML IJ SOLN: 4.0000 mg | Freq: Four times a day (QID) | INTRAMUSCULAR | Status: DC | PRN

## 2017-05-26 MED ORDER — ENOXAPARIN SODIUM 40 MG/0.4ML ~~LOC~~ SOLN
40.0000 mg | SUBCUTANEOUS | Status: DC
  Administered 2017-05-26 – 2017-05-31 (×6): 40 mg via SUBCUTANEOUS
  Filled 2017-05-26 (×6): qty 0.4

## 2017-05-26 MED ORDER — ONDANSETRON HCL 4 MG PO TABS: 4.0000 mg | ORAL_TABLET | Freq: Four times a day (QID) | ORAL | Status: DC | PRN

## 2017-05-26 MED ORDER — DOCUSATE SODIUM 100 MG PO CAPS
100.0000 mg | ORAL_CAPSULE | Freq: Two times a day (BID) | ORAL | Status: DC
  Administered 2017-05-26 – 2017-06-01 (×12): 100 mg via ORAL
  Filled 2017-05-26 (×12): qty 1

## 2017-05-26 MED ORDER — SACCHAROMYCES BOULARDII 250 MG PO CAPS
250.0000 mg | ORAL_CAPSULE | Freq: Two times a day (BID) | ORAL | Status: DC
  Administered 2017-05-26 – 2017-06-01 (×12): 250 mg via ORAL
  Filled 2017-05-26 (×14): qty 1

## 2017-05-26 MED ORDER — BUDESONIDE 0.5 MG/2ML IN SUSP
0.5000 mg | Freq: Two times a day (BID) | RESPIRATORY_TRACT | Status: DC
  Administered 2017-05-26 – 2017-06-01 (×12): 0.5 mg via RESPIRATORY_TRACT
  Filled 2017-05-26 (×12): qty 2

## 2017-05-26 NOTE — Consult Note (Signed)
Name: Dana [name] MRN: [id number] DOB: 10/22/1932    ADMISSION DATE:  05/26/2017  CONSULTATION DATE: 05/26/17  REFERRING MD :  Dr. Sheryle Hailiamond  CHIEF COMPLAINT:  Shortness of breath  BRIEF PATIENT DESCRIPTION: 83 year old female with Hx of CHF presenting with shortness of breath related to Pulmonary Edema Vs PNA.  Placed on BiPAP  SIGNIFICANT EVENTS  11/27 >> Patient admitted to the SDU  On BiPAP  STUDIES:  03/26/16 ECHO>>The cavity size was normal. There was mild  concentric hypertrophy. Systolic function was vigorous. The  estimated ejection fraction was in the range of 65% to 70%  HISTORY OF PRESENT ILLNESS: Dana [name] is an 83 year old female with known history of CHF and COPD- On 3liters of O2 at baseline.  Patient states that she had cough for 2 days but no fever,chills,nausea and Vomiting.  Patient was very short of breath and when EMS arrived she was satting 68%.  She was placed on C-PAP and her O2 sats improved. CXR was concerning for mild pulmonary edema. She received 60 mg lasix X1 and a dose of Vanc and Zosyn in the ER.  Patient was sent to the SDU for closer monitoring.  PAST MEDICAL HISTORY :   has a past medical history of CHF (congestive heart failure) (HCC) and COPD (chronic obstructive pulmonary disease) (HCC).  has a past surgical history that includes Hernia repair. Prior to Admission medications   Medication Sig Start Date End Date Taking? Authorizing Provider  albuterol (PROVENTIL HFA;VENTOLIN HFA) 108 (90 Base) MCG/ACT inhaler Inhale 2 puffs into the lungs every 6 (six) hours as needed for wheezing. 02/13/16 05/26/17 Yes [provider]  aspirin 81 MG chewable tablet Chew 1 tablet (81 mg total) by mouth daily. 04/01/16  Yes Regalado, Belkys A, MD  atorvastatin (LIPITOR) 40 MG tablet Take 40 mg by mouth every evening.   Yes [provider]  ergocalciferol (VITAMIN D2) 50000 units capsule Take 50,000 Units by mouth once a week.   Yes [provider]  feeding supplement, ENSURE ENLIVE, (ENSURE ENLIVE) LIQD Take 237 mLs by mouth 2 (two) times daily between meals. 04/01/16  Yes Regalado, Belkys A, MD  furosemide (LASIX) 20 MG tablet Take 60 mg by mouth.   Yes [provider]  gabapentin (NEURONTIN) 100 MG capsule Take 200 mg by mouth 3 (three) times daily.   Yes [provider]  ipratropium-albuterol (DUONEB) 0.5-2.5 (3) MG/3ML SOLN Take 3 mLs by nebulization every 6 (six) hours. 04/01/16  Yes Regalado, Belkys A, MD  acetaminophen (TYLENOL) 325 MG tablet Take 325-650 mg by mouth every 6 (six) hours as needed for headache.    [provider]  amiodarone (PACERONE) 200 MG tablet Take 1 tablet (200 mg total) by mouth daily. Patient not taking: Reported on 05/26/2017 04/01/16   Regalado, Jon BillingsBelkys A, MD  budesonide (PULMICORT) 0.5 MG/2ML nebulizer solution Take 2 mLs (0.5 mg total) by nebulization 2 (two) times daily. 04/01/16   Regalado, Belkys A, MD  famotidine (PEPCID) 20 MG tablet Take 1 tablet (20 mg total) by mouth at bedtime. Patient not taking: Reported on 05/26/2017 04/01/16   Regalado, Jon BillingsBelkys A, MD  nicotine (NICODERM CQ - DOSED IN MG/24 HOURS) 21 mg/24hr patch Place 1 patch (21 mg total) onto the skin daily. Patient not taking: Reported on 05/26/2017 04/01/16   Regalado, Jon BillingsBelkys A, MD  predniSONE (DELTASONE) 20 MG tablet Take 40 mg for 5 days. Patient not taking: Reported on 05/26/2017 04/01/16  Regalado, Belkys A, MD  saccharomyces boulardii (FLORASTOR) 250 MG capsule Take 1 capsule (250 mg total) by mouth 2 (two) times daily. Patient not taking: Reported on 05/26/2017 04/01/16   Regalado, Jon BillingsBelkys A, MD  senna-docusate (SENOKOT-S) 8.6-50 MG tablet Take 1 tablet by mouth 2 (two) times daily. 04/01/16   Regalado, Prentiss BellsBelkys A, MD   No Known Allergies  FAMILY HISTORY:  family history is not on file. SOCIAL HISTORY:  reports that she has been smoking cigarettes.  She has been smoking about 1.00 pack per day. she  has never used smokeless tobacco. She reports that she does not drink alcohol or use drugs. ve for environmental allergies and polydipsia. Does not bruise/bleed easily.  SUBJECTIVE: Patient states that "she feels better and is not short of breath anymore"  VITAL SIGNS: Temp:  [92.9 F (33.8 C)-95 F (35 C)] 95 F (35 C) (11/27 1930) Pulse Rate:  [62-74] 65 (11/27 1930) Resp:  [18-24] 20 (11/27 1930) BP: (101-116)/(50-68) 111/52 (11/27 1930) SpO2:  [85 %-100 %] 93 % (11/27 1930) FiO2 (%):  [36 %] 36 % (11/27 1702) Weight:  [162 lb (73.5 kg)] 162 lb (73.5 kg) (11/27 1625)  PHYSICAL EXAMINATION: General: Elderly Caucasian female, on Bipap, in no acute distress Neuro:Awake,alert and oriented   HEENT:  HOH, AT,[place], No jvd Cardiovascular:  S1s2, regular,no m/r/g Lungs:diminished bibasilar, no wheezes,crackles and rhonchi Abdomen: soft,NT,ND Musculoskeletal: mild edema on lower extremities, no cyanosis Skin: multiple skin bruises, dry and warm  Recent Labs  Lab 05/26/17 1627  NA 142  K 3.5  CL 91*  CO2 37*  BUN 19  CREATININE 0.87  GLUCOSE 130*   Recent Labs  Lab 05/26/17 1627  HGB 10.8*  HCT 34.2*  WBC 9.0  PLT 190   Dg Chest Portable 1 View  Result Date: 05/26/2017 CLINICAL DATA:  Difficulty breathing.  History of CHF. EXAM: PORTABLE CHEST 1 VIEW COMPARISON:  05/30/2016 and 03/29/2016 FINDINGS: Interstitial markings are prominent and raise concern for edema. There are densities at both lung bases which could represent volume loss and pleural fluid. Heart size appears to be upper limits of normal. Question increased densities along the medial left upper lung. Atherosclerotic calcifications at the aortic arch. Negative for a pneumothorax. IMPRESSION: Bibasilar densities are concerning for pleural fluid and basilar atelectasis. Prominent interstitial markings could represent mild edema. Questionable densities along the medial left upper lung. Recommend follow-up two view  examination to better characterize this area. Electronically Signed   By: Richarda OverlieAdam  Henn M.D.   On: 05/26/2017 16:47    ASSESSMENT / PLAN:  Acute Respiratory failure related to mild interstitial Pulmonary edema Hx of COPD Hx of CHF  Plan Continue BiPAP, wean as tolerated  Lasix 60mg  x1 in ED Continue Amiodarone/aspirin Continue Atorvastatin Continue Bronchodilator Continue Nicotine patch Follow cultures Monitor Fever,CBC     Tran Arzuaga,AG-ACNP Pulmonary and Critical Care Medicine [facility]   05/26/2017, 8:14 PM

## 2017-05-26 NOTE — ED Triage Notes (Signed)
Patient from home via [facility]. EMS reports they were called out for breathing difficulty . Upon arrival patient was 68% on her baseline 3L nasal cannula. EMS placed patient on cpap with improved oxygenation of 95%. Patient has history of COPD and CHF. Reports she has recently been taking increased dose of lasix but unsure of how much. Patient also reports cough with small amount of phlegm production.

## 2017-05-26 NOTE — ED Provider Notes (Signed)
[facility] Emergency Department Provider Note  ____________________________________________  Time seen: Approximately 4:26 PM  I have reviewed the triage vital signs and the nursing notes.   HISTORY  Chief Complaint Shortness of Breath    HPI Dana [name] is a 83 y.o. female with a history of COPD on 3 L nasal cannula shortness of breath and hypoxia.  The patient reports that for the past 2 days, she has had a nonproductive cough without any congestion or rhinorrhea, sore throat or ear pain.  No fevers or chills.  She did hold her Lasix for an upcoming physicians appointment.  She denies any nausea vomiting or diarrhea, abdominal pain.  She does report bilateral lower extremity edema and it is unclear whether this is chronic or worse.  EMS was called and upon arrival, the patient was satting 68% on her baseline 3 L nasal cannula.  On CPAP in route, the patient's O2 sats went up to the 90s.  No chest pain, palpitations, lightheadedness or syncope..   Past Medical History:  Diagnosis Date  . CHF (congestive heart failure) (HCC)   . COPD (chronic obstructive pulmonary disease) [facility])     Patient Active Problem List   Diagnosis Date Noted  . Respiratory failure (HCC) 03/24/2016  . CAP (community acquired pneumonia)   . Demand ischemia [facility])     Past Surgical History:  Procedure Laterality Date  . HERNIA REPAIR     around [employer] area    Current Outpatient Rx  . Order #: [id number] Class: Historical Med  . Order #: [id number] Class: Historical Med  . Order #: [id number] Class: Print  . Order #: [id number] Class: Print  . Order #: [id number] Class: Print  . Order #: [id number] Class: Historical Med  . Order #: [id number] Class: Print  . Order #: [id number] Class: Print  . Order #: [id number] Class: Print  . Order #: [id number] Class: Print  . Order #: [id number] Class: Print  . Order #: [id number] Class: Print  . Order #: [id number] Class: Print  . Order #: [id number] Class:  Historical Med  . Order #: [id number] Class: Print  . Order #: [id number] Class: Print    Allergies Patient has no known allergies.  No family history on file.  Social History Social History   Tobacco Use  . Smoking status: Current Every Day Smoker    Packs/day: 1.00    Types: Cigarettes  . Smokeless tobacco: Never Used  Substance Use Topics  . Alcohol use: No  . Drug use: No    Review of Systems Constitutional: No fever/chills.  No lightheadedness or syncope.  No general malaise.  No myalgias. Eyes: No visual changes.  No eye discharge. ENT: No sore throat. No congestion or rhinorrhea. Cardiovascular: Denies chest pain. Denies palpitations. Respiratory: Positive hypoxia with shortness of breath.  Positive nonproductive cough. Gastrointestinal: No abdominal pain.  No nausea, no vomiting.  No diarrhea.  No constipation. Genitourinary: Negative for dysuria. Musculoskeletal: Negative for back pain.  Positive bilateral lower extremity swelling.   Skin: Negative for rash. Neurological: Negative for headaches. No focal numbness, tingling or weakness.     ____________________________________________   PHYSICAL EXAM:  VITAL SIGNS: ED Triage Vitals [05/26/17 1623]  Enc Vitals Group     BP      Pulse      Resp      Temp      Temp src      SpO2 (!) 85 %     Weight      Height      Head Circumference  Peak Flow      Pain Score      Pain Loc      Pain Edu?      Excl. in GC?     Constitutional: Alert and oriented.  Chronically ill-appearing but nontoxic. Answers questions appropriately. Eyes: Conjunctivae are normal.  EOMI. no eye discharge.  No scleral icterus. Head: Atraumatic. Nose: No congestion/rhinnorhea. Mouth/Throat: Mucous membranes are dry.  Neck: No stridor.  Supple.  No JVD.  No meningismus. Cardiovascular: Fast rate, regular rhythm. No murmurs, rubs or gallops.  Respiratory: The patient is tachypneic with accessory muscle use and retractions.  She  speaks in 2-3 word sentences with some difficulty.  She has diffuse wheezes expiratory greater than inspiratory in all lung fields, with some rales in the bases bilaterally.  No rhonchi.   Gastrointestinal: Obese.  Soft, nontender and nondistended.  No guarding or rebound.  No peritoneal signs. Musculoskeletal: Bilateral symmetric lower extremity edema.  The patient has wound care [place] which has been removed with bilateral erythema over the tibias with associated warmth, and a healing ulcer on the dorsum of the left foot without any purulent discharge.  Normal DP and PT pulses bilaterally. Neurologic:  A&Ox3.  Speech is clear.  Face and smile are symmetric.  EOMI.  Moves all extremities well. Skin:  Skin is warm, dry and intact. No rash noted. Psychiatric: Mood and affect are normal. Speech and behavior are normal.  Normal judgement.  ____________________________________________   LABS (all labs ordered are listed, but only abnormal results are displayed)  Labs Reviewed  CBC WITH DIFFERENTIAL/PLATELET - Abnormal; Notable for the following components:      Result Value   RBC 3.74 (*)    Hemoglobin 10.8 (*)    HCT 34.2 (*)    MCHC 31.7 (*)    RDW 15.1 (*)    Neutro Abs 7.5 (*)    Lymphs Abs 0.6 (*)    All other components within normal limits  COMPREHENSIVE METABOLIC PANEL - Abnormal; Notable for the following components:   Chloride 91 (*)    CO2 37 (*)    Glucose, Bld 130 (*)    Albumin 3.1 (*)    ALT 13 (*)    All other components within normal limits  BRAIN NATRIURETIC PEPTIDE - Abnormal; Notable for the following components:   B Natriuretic Peptide 268.0 (*)    All other components within normal limits  BLOOD GAS, VENOUS - Abnormal; Notable for the following components:   pCO2, Ven 106 (*)    Bicarbonate 49.8 (*)    Acid-Base Excess 17.4 (*)    All other components within normal limits  CULTURE, BLOOD (ROUTINE X 2)  CULTURE, BLOOD (ROUTINE X 2)  TROPONIN I  PROTIME-INR   APTT  LACTIC ACID, PLASMA  URINALYSIS, COMPLETE (UACMP) WITH MICROSCOPIC  LACTIC ACID, PLASMA  INFLUENZA PANEL BY PCR (TYPE A & B)   ____________________________________________  EKG  ED ECG REPORT I, Rockne Menghini, the attending physician, personally viewed and interpreted this ECG.   Date: 05/26/2017  EKG Time: 1634  Rate: 73  Rhythm: normal sinus rhythm  Axis: normal  Intervals:none  ST&T Change: Poor baseline tracing; no STEMI  ____________________________________________  RADIOLOGY  Dg Chest Portable 1 View  Result Date: 05/26/2017 CLINICAL DATA:  Difficulty breathing.  History of CHF. EXAM: PORTABLE CHEST 1 VIEW COMPARISON:  05/30/2016 and 03/29/2016 FINDINGS: Interstitial markings are prominent and raise concern for edema. There are densities at both lung bases which could represent  volume loss and pleural fluid. Heart size appears to be upper limits of normal. Question increased densities along the medial left upper lung. Atherosclerotic calcifications at the aortic arch. Negative for a pneumothorax. IMPRESSION: Bibasilar densities are concerning for pleural fluid and basilar atelectasis. Prominent interstitial markings could represent mild edema. Questionable densities along the medial left upper lung. Recommend follow-up two view examination to better characterize this area. Electronically Signed   By: Richarda OverlieAdam  Henn M.D.   On: 05/26/2017 16:47    ____________________________________________   PROCEDURES  Procedure(s) performed: None  Procedures  Critical Care performed: Yes, see critical care note(s) ____________________________________________   INITIAL IMPRESSION / ASSESSMENT AND PLAN / ED COURSE  Pertinent labs & imaging results that were available during my care of the patient were reviewed by me and considered in my medical decision making (see chart for details).  83 y.o. female with a history of COPD on 3 L nasal cannula at baseline presenting  with shortness of breath, nonproductive cough and hypoxia.  Overall, the patient has a clinical history and presentation that is most concerning for fluid overload from CHF.  However, primary pulmonary causes including infection such as pneumonia or influenza is also possible.  PE is possible but less likely.  We will also evaluate for cardiac cause including ACS or MI, arrhythmia.    Upon arrival to the emergency department, the patient is satting 85% on room air.  She is placed on BiPAP, with resulting O2 saturations between 90-95%.  [facility].  ----------------------------------------- 5:30 PM on 05/26/2017 -----------------------------------------  The patient has continued to maintain oxygen saturations between 88 and 92% on BiPAP.  She has a normal pH but her CO2 is 106.  Her chest x-ray is most consistent with pulmonary edema, and there is no evidence of a focal infiltrate suggestive of pneumonia.  The patient has an elevated BNP, which again correlates to her clinical picture of CHF exacerbation.  However, her rectal temperature which was taken twice with 2 separate thermometers, does show a temperature of 92.2.  A bear hugger has been applied, and the patient will receive Zosyn in addition to vancomycin which she received for her bilateral lower extremity cellulitis.  This will cover her empirically for infection.  Her influenza testing is still pending.  At this time, we will plan to admit the patient to the hospital for further evaluation and treatment.  CRITICAL CARE Performed by: Rockne MenghiniNorman, Anne-Caroline   Total critical care time: 45 minutes  Critical care time was exclusive of separately billable procedures and treating other patients.  Critical care was necessary to treat or prevent imminent or life-threatening deterioration.  Critical care was time spent personally by me on the following activities: development of treatment plan with patient and/or surrogate as  well as nursing, discussions with consultants, evaluation of patient's response to treatment, examination of patient, obtaining history from patient or surrogate, ordering and performing treatments and interventions, ordering and review of laboratory studies, ordering and review of radiographic studies, pulse oximetry and re-evaluation of patient's condition.   ____________________________________________  FINAL CLINICAL IMPRESSION(S) / ED DIAGNOSES  Final diagnoses:  Acute respiratory failure with hypoxia and hypercarbia (HCC)  Cellulitis of lower extremity, unspecified laterality  Hypothermia, initial encounter  Acute on chronic congestive heart failure, unspecified heart failure type (HCC)         NEW MEDICATIONS STARTED DURING THIS VISIT:  This SmartLink is deprecated. Use AVSMEDLIST instead to display the medication list for a patient.  Rockne MenghiniNorman, Anne-Caroline, MD 05/26/17 1733

## 2017-05-26 NOTE — Progress Notes (Signed)
Increased patients fio2 to 40% due to saturations dropping into the upper 80's, saturations currently 91%.

## 2017-05-26 NOTE — Progress Notes (Signed)
eLink Physician-Brief Progress Note Patient Name: Dana [name] DOB: 11/12/1932 MRN: [id number]   Date of Service  05/26/2017  HPI/Events of Note   [age]  F with a history of COPD on 3 L nasal cannula shortness of breath and hypoxia.  The patient reports that for the past 2 days, she has had a nonproductive cough without any congestion or rhinorrhea, sore throat or ear pain.  No fevers or chills.  She did hold her Lasix for an upcoming physicians appointment.  She denies any nausea vomiting or diarrhea, abdominal pain.  She report bilateral lower extremity edema and it is unclear whether this is chronic or worse.  EMS was called and upon arrival, the patient was satting 68% on her baseline 3 L nasal cannula.  On CPAP in route, the patient's O2 sats went up to the 90s.  No chest pain, palpitations, lightheadedness or syncope.    On PCXR patient with some pulm edema but also with concern for bilateral infiltrates suggestive of bilateral PNA.  Given lasix in ED, placed on BiPAP and broad spec ABX initiated.  Admitted to SDU for further care.  On camera check the patient is alert and oriented on BiPAP.  She is HD stable with sats of 96% and RR of 25.     eICU Interventions  Plan of care per primary admitting team Transition off BiPAP as tolerated [facility] at bedside     Intervention Category Evaluation Type: New Patient Evaluation  Margo Lama 05/26/2017, 8:16 PM

## 2017-05-26 NOTE — H&P (Addendum)
Dana [name] is an 83 y.o. female.   Chief Complaint: Shortness of breath HPI: Patient with past medical history of COPD and CHF presents to the emergency department via EMS complaining of shortness of breath.  The patient states that she did not take her Lasix today because she was trying to prepare for a doctor's appointment.  She was found to have oxygen saturations of 68% on her usual 3 L of oxygen via nasal cannula at home.  She was placed on CPAP in the field and transition to BiPAP in the emergency department.  She denies chest pain, nausea, vomiting or diarrhea at home.  The patient has been afebrile as well.  Her work of breathing but continued to be labored and the patient was difficult to arouse although she would answer questions appropriately when awakened.  Chest x-ray showed pulmonary edema as well as likely pneumonia which prompted the emergency department staff to call the hospitalist service for admission.  Past Medical History:  Diagnosis Date  . CHF (congestive heart failure) ([place])   . COPD (chronic obstructive pulmonary disease) ([place])     Past Surgical History:  Procedure Laterality Date  . HERNIA REPAIR     around [place]    Family History  Problem Relation Age of Onset  . Cancer Mother   . COPD Father    Social History:  reports that she has been smoking cigarettes.  She has been smoking about 1.00 pack per day. she has never used smokeless tobacco. She reports that she does not drink alcohol or use drugs.  Allergies: No Known Allergies  Medications Prior to Admission  Medication Sig Dispense Refill  . albuterol (PROVENTIL HFA;VENTOLIN HFA) 108 (90 Base) MCG/ACT inhaler Inhale 2 puffs into the lungs every 6 (six) hours as needed for wheezing.    Marland Kitchen aspirin 81 MG chewable tablet Chew 1 tablet (81 mg total) by mouth daily. 30 tablet 0  . atorvastatin (LIPITOR) 40 MG tablet Take 40 mg by mouth every evening.    . ergocalciferol (VITAMIN D2) 50000 units capsule  Take 50,000 Units by mouth once a week.    . feeding supplement, ENSURE ENLIVE, (ENSURE ENLIVE) LIQD Take 237 mLs by mouth 2 (two) times daily between meals. 237 mL 12  . furosemide (LASIX) 20 MG tablet Take 60 mg by mouth.    . gabapentin (NEURONTIN) 100 MG capsule Take 200 mg by mouth 3 (three) times daily.    Marland Kitchen ipratropium-albuterol (DUONEB) 0.5-2.5 (3) MG/3ML SOLN Take 3 mLs by nebulization every 6 (six) hours. 360 mL 0  . acetaminophen (TYLENOL) 325 MG tablet Take 325-650 mg by mouth every 6 (six) hours as needed for headache.    Marland Kitchen amiodarone (PACERONE) 200 MG tablet Take 1 tablet (200 mg total) by mouth daily. (Patient not taking: Reported on 05/26/2017) 30 tablet 0  . budesonide (PULMICORT) 0.5 MG/2ML nebulizer solution Take 2 mLs (0.5 mg total) by nebulization 2 (two) times daily. 100 mL 0  . famotidine (PEPCID) 20 MG tablet Take 1 tablet (20 mg total) by mouth at bedtime. (Patient not taking: Reported on 05/26/2017) 30 tablet 0  . nicotine (NICODERM CQ - DOSED IN MG/24 HOURS) 21 mg/24hr patch Place 1 patch (21 mg total) onto the skin daily. (Patient not taking: Reported on 05/26/2017) 28 patch 0  . predniSONE (DELTASONE) 20 MG tablet Take 40 mg for 5 days. (Patient not taking: Reported on 05/26/2017) 10 tablet 0  . saccharomyces boulardii (FLORASTOR) 250 MG capsule  Take 1 capsule (250 mg total) by mouth 2 (two) times daily. (Patient not taking: Reported on 05/26/2017) 60 capsule 0  . senna-docusate (SENOKOT-S) 8.6-50 MG tablet Take 1 tablet by mouth 2 (two) times daily. 30 tablet 0    Results for orders placed or performed during the hospital encounter of 05/26/17 (from the past 48 hour(s))  CBC with Differential     Status: Abnormal   Collection Time: 05/26/17  4:27 PM  Result Value Ref Range   WBC 9.0 3.6 - 11.0 K/uL   RBC 3.74 (L) 3.80 - 5.20 MIL/uL   Hemoglobin 10.8 (L) 12.0 - 16.0 g/dL   HCT 34.2 (L) 35.0 - 47.0 %   MCV 91.4 80.0 - 100.0 fL   MCH 29.0 26.0 - 34.0 pg   MCHC 31.7  (L) 32.0 - 36.0 g/dL   RDW 15.1 (H) 11.5 - 14.5 %   Platelets 190 150 - 440 K/uL   Neutrophils Relative % 82 %   Neutro Abs 7.5 (H) 1.4 - 6.5 K/uL   Lymphocytes Relative 6 %   Lymphs Abs 0.6 (L) 1.0 - 3.6 K/uL   Monocytes Relative 10 %   Monocytes Absolute 0.9 0.2 - 0.9 K/uL   Eosinophils Relative 1 %   Eosinophils Absolute 0.1 0 - 0.7 K/uL   Basophils Relative 1 %   Basophils Absolute 0.0 0 - 0.1 K/uL  Comprehensive metabolic panel     Status: Abnormal   Collection Time: 05/26/17  4:27 PM  Result Value Ref Range   Sodium 142 135 - 145 mmol/L   Potassium 3.5 3.5 - 5.1 mmol/L   Chloride 91 (L) 101 - 111 mmol/L   CO2 37 (H) 22 - 32 mmol/L   Glucose, Bld 130 (H) 65 - 99 mg/dL   BUN 19 6 - 20 mg/dL   Creatinine, Ser 0.87 0.44 - 1.00 mg/dL   Calcium 9.1 8.9 - 10.3 mg/dL   Total Protein 6.9 6.5 - 8.1 g/dL   Albumin 3.1 (L) 3.5 - 5.0 g/dL   AST 18 15 - 41 U/L   ALT 13 (L) 14 - 54 U/L   Alkaline Phosphatase 89 38 - 126 U/L   Total Bilirubin 0.6 0.3 - 1.2 mg/dL   GFR calc non Af Amer >60 >60 mL/min   GFR calc Af Amer >60 >60 mL/min    Comment: (NOTE) The eGFR has been calculated using the CKD EPI equation. This calculation has not been validated in all clinical situations. eGFR's persistently <60 mL/min signify possible Chronic Kidney Disease.    Anion gap 14 5 - 15  Troponin I     Status: None   Collection Time: 05/26/17  4:27 PM  Result Value Ref Range   Troponin I <0.03 <0.03 ng/mL  Protime-INR     Status: None   Collection Time: 05/26/17  4:27 PM  Result Value Ref Range   Prothrombin Time 12.1 11.4 - 15.2 seconds   INR 0.90   APTT     Status: None   Collection Time: 05/26/17  4:27 PM  Result Value Ref Range   aPTT 32 24 - 36 seconds  Brain natriuretic peptide     Status: Abnormal   Collection Time: 05/26/17  4:27 PM  Result Value Ref Range   B Natriuretic Peptide 268.0 (H) 0.0 - 100.0 pg/mL  Blood gas, venous     Status: Abnormal   Collection Time: 05/26/17  4:27  PM  Result Value Ref Range   pH,  Ven 7.28 7.250 - 7.430   pCO2, Ven 106 (HH) 44.0 - 60.0 mmHg    Comment: CRITICAL RESULT CALLED TO, READ BACK BY AND VERIFIED WITH: LAURA CATES RN @ [phone] ON 05/26/17 BY SNM    pO2, Ven UNABLE TO CALCULATE. 32.0 - 45.0 mmHg   Bicarbonate 49.8 (H) 20.0 - 28.0 mmol/L   Acid-Base Excess 17.4 (H) 0.0 - 2.0 mmol/L   O2 Saturation UNABLE TO CALCULATE. %   Patient temperature 37.0    Collection site VEIN    Sample type VENIPUNCTURE   Influenza panel by PCR (type A & B)     Status: None   Collection Time: 05/26/17  4:27 PM  Result Value Ref Range   Influenza A By PCR NEGATIVE NEGATIVE   Influenza B By PCR NEGATIVE NEGATIVE    Comment: (NOTE) The Xpert Xpress Flu assay is intended as an aid in the diagnosis of  influenza and should not be used as a sole basis for treatment.  This  assay is FDA approved for nasopharyngeal swab specimens only. Nasal  washings and aspirates are unacceptable for Xpert Xpress Flu testing.   TSH     Status: None   Collection Time: 05/26/17  4:27 PM  Result Value Ref Range   TSH 2.100 0.350 - 4.500 uIU/mL    Comment: Performed by a 3rd Generation assay with a functional sensitivity of <=0.01 uIU/mL.  Lactic acid, plasma     Status: None   Collection Time: 05/26/17  4:28 PM  Result Value Ref Range   Lactic Acid, Venous 1.8 0.5 - 1.9 mmol/L  Urinalysis, Complete w Microscopic     Status: Abnormal   Collection Time: 05/26/17  5:24 PM  Result Value Ref Range   Color, Urine YELLOW (A) YELLOW   APPearance CLEAR (A) CLEAR   Specific Gravity, Urine 1.018 1.005 - 1.030   pH 5.0 5.0 - 8.0   Glucose, UA NEGATIVE NEGATIVE mg/dL   Hgb urine dipstick NEGATIVE NEGATIVE   Bilirubin Urine NEGATIVE NEGATIVE   Ketones, ur NEGATIVE NEGATIVE mg/dL   Protein, ur 30 (A) NEGATIVE mg/dL   Nitrite NEGATIVE NEGATIVE   Leukocytes, UA NEGATIVE NEGATIVE   RBC / HPF 0-5 0 - 5 RBC/hpf   WBC, UA 0-5 0 - 5 WBC/hpf   Bacteria, UA NONE SEEN NONE SEEN    Squamous Epithelial / LPF NONE SEEN NONE SEEN   Hyaline Casts, UA PRESENT   Glucose, capillary     Status: Abnormal   Collection Time: 05/26/17  8:18 PM  Result Value Ref Range   Glucose-Capillary 125 (H) 65 - 99 mg/dL   Dg Chest 2 View  Result Date: 05/26/2017 CLINICAL DATA:  Shortness of breath. Follow-up opacity overlying the medial left upper lung. EXAM: CHEST  2 VIEW COMPARISON:  05/26/2017 and prior exams FINDINGS: Upper limits normal heart size, small bilateral pleural effusions and bilateral lower lung atelectasis/ airspace disease again noted. Interstitial prominence is again identified. A linear opacity measuring up to 2 cm overlies the medial left upper lung. This is of uncertain etiology but could represent atelectasis. Recommend chest radiographic follow-up in 6-8 weeks. IMPRESSION: Linear opacity overlying the medial left upper lung, of uncertain etiology but could represent atelectasis. Chest radiographic follow-up in 6-8 weeks recommended. Punch small bilateral pleural effusions, bilateral lower lung atelectasis/ airspace disease and possible mild interstitial edema. Electronically Signed   By: Margarette Canada M.D.   On: 05/26/2017 20:27   Dg Chest Portable 1 View  Result Date: 05/26/2017 CLINICAL DATA:  Difficulty breathing.  History of CHF. EXAM: PORTABLE CHEST 1 VIEW COMPARISON:  05/30/2016 and 03/29/2016 FINDINGS: Interstitial markings are prominent and raise concern for edema. There are densities at both lung bases which could represent volume loss and pleural fluid. Heart size appears to be upper limits of normal. Question increased densities along the medial left upper lung. Atherosclerotic calcifications at the aortic arch. Negative for a pneumothorax. IMPRESSION: Bibasilar densities are concerning for pleural fluid and basilar atelectasis. Prominent interstitial markings could represent mild edema. Questionable densities along the medial left upper lung. Recommend follow-up two  view examination to better characterize this area. Electronically Signed   By: Markus Daft M.D.   On: 05/26/2017 16:47    Review of Systems  Unable to perform ROS: Acuity of condition    Blood pressure (!) 116/100, pulse 74, temperature (!) 96.3 F (35.7 C), resp. rate 19, height 4' 7"  (1.397 m), weight 73.5 kg (162 lb), SpO2 97 %. Physical Exam  Vitals reviewed. Constitutional: She is oriented to person, place, and time. She appears well-developed and well-nourished. No distress.  HENT:  Head: Normocephalic and atraumatic.  Mouth/Throat: Oropharynx is clear and moist.  Eyes: Conjunctivae and EOM are normal. Pupils are equal, round, and reactive to light. No scleral icterus.  Neck: Normal range of motion. Neck supple. No JVD present. No tracheal deviation present. No thyromegaly present.  Cardiovascular: Normal rate, regular rhythm and normal heart sounds. Exam reveals no gallop and no friction rub.  No murmur heard. Respiratory: Breath sounds normal. She is in respiratory distress.  GI: Soft. Bowel sounds are normal. She exhibits no distension. There is no tenderness.  Genitourinary:  Genitourinary Comments: Deferred  Musculoskeletal: Normal range of motion. She exhibits edema.  Lymphadenopathy:    She has no cervical adenopathy.  Neurological: She is alert and oriented to person, place, and time. No cranial nerve deficit. She exhibits normal muscle tone.  Skin: Skin is warm and dry. No rash noted. No erythema.  Psychiatric: She has a normal mood and affect. Her behavior is normal. Judgment and thought content normal.     Assessment/Plan This is an 83 year old female admitted for sepsis. 1.  Respiratory failure: Acute on chronic; with hypoxia.  The patient is on BiPAP at 40% FiO2.  Etiology is multifactorial including pulmonary edema secondary to CHF as well as possible underlying pneumonia.  Patient has received IV antibiotics as well as Lasix 60 mg IV.  Continue IV diuresis.  CHF  is diastolic as the patient has a preserved ejection fraction. 2.  Sepsis: The patient meets criteria via hypothermia as well as tachypnea.  She is hemodynamically stable.  Continue IV antibiotics and warming blanket.  Source is likely pneumonia. Follow blood cultures for growth and sensitivities.  Obtain sputum sample if possible. 3. COPD: Complicates respiratory failure.  Continue inhaled corticosteroid as well as duo nebs 4.  Arrhythmia: history of Afib (paroxysmal?) Continue amiodarone 5.  Hyperlipidemia: Continue statin therapy 6.  DVT prophylaxis: Lovenox 7.  GI prophylaxis: Pepcid The patient is a full code.  Time spent on admission orders and critical care approximately 45 minutes  Harrie Foreman, MD 05/26/2017, 10:42 PM

## 2017-05-27 ENCOUNTER — Inpatient Hospital Stay: Payer: Medicare Other

## 2017-05-27 DIAGNOSIS — J9602 Acute respiratory failure with hypercapnia: Secondary | ICD-10-CM

## 2017-05-27 DIAGNOSIS — I509 Heart failure, unspecified: Secondary | ICD-10-CM

## 2017-05-27 DIAGNOSIS — J9601 Acute respiratory failure with hypoxia: Secondary | ICD-10-CM

## 2017-05-27 LAB — PROCALCITONIN: Procalcitonin: <0.1 ng/mL

## 2017-05-27 LAB — STREP PNEUMONIAE URINARY ANTIGEN: Strep Pneumo Urinary Antigen: NEGATIVE

## 2017-05-27 MED ORDER — AZITHROMYCIN 500 MG IV SOLR: 500.0000 mg | INTRAVENOUS | Status: DC

## 2017-05-27 MED ORDER — AZITHROMYCIN 500 MG IV SOLR
500.0000 mg | Freq: Every day | INTRAVENOUS | Status: DC
  Administered 2017-05-27: 500 mg via INTRAVENOUS
  Filled 2017-05-27: qty 500

## 2017-05-27 MED ORDER — DEXTROSE 5 % IV SOLN
1.0000 g | INTRAVENOUS | Status: DC
  Filled 2017-05-27: qty 10

## 2017-05-27 MED ORDER — POTASSIUM CHLORIDE CRYS ER 20 MEQ PO TBCR
40.0000 meq | EXTENDED_RELEASE_TABLET | Freq: Once | ORAL | Status: AC
  Administered 2017-05-27: 40 meq via ORAL
  Filled 2017-05-27: qty 2

## 2017-05-27 MED ORDER — MUPIROCIN 2 % EX OINT
1.0000 "application " | TOPICAL_OINTMENT | Freq: Two times a day (BID) | CUTANEOUS | Status: AC
  Administered 2017-05-27 – 2017-05-31 (×10): 1 via NASAL
  Filled 2017-05-27 (×2): qty 22

## 2017-05-27 MED ORDER — CEFTRIAXONE SODIUM 1 G IJ SOLR
1.0000 g | Freq: Every day | INTRAMUSCULAR | Status: DC
  Administered 2017-05-27: 1 g via INTRAVENOUS
  Filled 2017-05-27: qty 10

## 2017-05-27 MED ORDER — CHLORHEXIDINE GLUCONATE CLOTH 2 % EX PADS
6.0000 | MEDICATED_PAD | Freq: Every day | CUTANEOUS | Status: AC
  Administered 2017-05-27 – 2017-05-31 (×4): 6 via TOPICAL

## 2017-05-27 NOTE — Progress Notes (Addendum)
Pharmacy Antibiotic Note  Dana [name] is a 83 y.o. female admitted on 05/26/2017 with pneumonia.  Pharmacy has been consulted for ceftriaxone dosing.  Plan: Ceftriaxone 1 gram q 24 hours ordered.  Height: 4\' 7"  (139.7 cm) Weight: 162 lb 7.7 oz (73.7 kg) IBW/kg (Calculated) : 34  Temp (24hrs), Avg:99.2 F (37.3 C), Min:96.6 F (35.9 C), Max:100.8 F (38.2 C)  Recent Labs  Lab 05/26/17 1627 05/26/17 1628  WBC 9.0  --   CREATININE 0.87  --   LATICACIDVEN  --  1.8    Estimated Creatinine Clearance: 39.3 mL/min (by C-G formula based on SCr of 0.87 mg/dL).    No Known Allergies  Antimicrobials this admission: Azithromycin, ceftriaxone 11/28 >>    >>   Dose adjustments this admission:   Microbiology results: 11/27 BCx: NG x 24 h 11/27 MRSA PCR: (+)      11/27 CXR: bilateral lower lung atelectasis/ airspace disease   Thank you for allowing pharmacy to be a part of this patient'[name] care.  Dana [name] 05/27/2017 9:48 PM   Add: Abx d/c by CCM for Pct <0.1.  Dana [name], PharmD, BCPS  05/27/17 9:53 PM

## 2017-05-27 NOTE — Progress Notes (Signed)
[facility] [place] [facility]   PATIENT NAME: Dana [name]    MR#:  [id number]  DATE OF BIRTH:  09/08/1932  SUBJECTIVE:  CHIEF COMPLAINT:   Chief Complaint  Patient presents with  . Shortness of Breath  having tremors, feeling better still requiring venti mask REVIEW OF SYSTEMS:  Review of Systems  Constitutional: Negative for chills, fever and weight loss.  HENT: Negative for nosebleeds and sore throat.   Eyes: Negative for blurred vision.  Respiratory: Positive for shortness of breath. Negative for cough and wheezing.   Cardiovascular: Negative for chest pain, orthopnea, leg swelling and PND.  Gastrointestinal: Negative for abdominal pain, constipation, diarrhea, heartburn, nausea and vomiting.  Genitourinary: Negative for dysuria and urgency.  Musculoskeletal: Negative for back pain.  Skin: Negative for rash.  Neurological: Negative for dizziness, speech change, focal weakness and headaches.  Endo/Heme/Allergies: Does not bruise/bleed easily.  Psychiatric/Behavioral: Negative for depression.    DRUG ALLERGIES:  No Known Allergies VITALS:  Blood pressure 101/68, pulse 92, temperature 99.7 F (37.6 C), resp. rate (!) 30, height 4\' 7"  (1.397 m), weight 73.7 kg (162 lb 7.7 oz), SpO2 96 %. PHYSICAL EXAMINATION:  Physical Exam  Constitutional: She is oriented to person, place, and time and well-developed, well-nourished, and in no distress.  HENT:  Head: Normocephalic and atraumatic.  Eyes: Conjunctivae and EOM are normal. Pupils are equal, round, and reactive to light.  Neck: Normal range of motion. Neck supple. No tracheal deviation present. No thyromegaly present.  Cardiovascular: Normal rate, regular rhythm and normal heart sounds.  Pulmonary/Chest: Effort normal and breath sounds normal. No respiratory distress. She has no wheezes. She exhibits no tenderness.  Abdominal: Soft. Bowel sounds are normal. She exhibits no distension. There is no  tenderness.  Musculoskeletal: Normal range of motion.  Neurological: She is alert and oriented to person, place, and time. No cranial nerve deficit.  Skin: Skin is warm and dry. No rash noted.  Psychiatric: Mood and affect normal.   LABORATORY PANEL:  Female CBC Recent Labs  Lab 05/26/17 1627  WBC 9.0  HGB 10.8*  HCT 34.2*  PLT 190   ------------------------------------------------------------------------------------------------------------------ Chemistries  Recent Labs  Lab 05/26/17 1627  NA 142  K 3.5  CL 91*  CO2 37*  GLUCOSE 130*  BUN 19  CREATININE 0.87  CALCIUM 9.1  AST 18  ALT 13*  ALKPHOS 89  BILITOT 0.6   RADIOLOGY:  Dg Chest 2 View  Result Date: 05/26/2017 CLINICAL DATA:  Shortness of breath. Follow-up opacity overlying the medial left upper lung. EXAM: CHEST  2 VIEW COMPARISON:  05/26/2017 and prior exams FINDINGS: Upper limits normal heart size, small bilateral pleural effusions and bilateral lower lung atelectasis/ airspace disease again noted. Interstitial prominence is again identified. A linear opacity measuring up to 2 cm overlies the medial left upper lung. This is of uncertain etiology but could represent atelectasis. Recommend chest radiographic follow-up in 6-8 weeks. IMPRESSION: Linear opacity overlying the medial left upper lung, of uncertain etiology but could represent atelectasis. Chest radiographic follow-up in 6-8 weeks recommended. Punch small bilateral pleural effusions, bilateral lower lung atelectasis/ airspace disease and possible mild interstitial edema. Electronically Signed   By: Harmon PierJeffrey  Hu M.D.   On: 05/26/2017 20:27   [place] Venous Img Lower Bilateral  Result Date: 05/27/2017 CLINICAL DATA:  Bilateral lower extremity pain and edema for the past several weeks. History of smoking and varicose veins. History of malignancy. Evaluate for DVT. EXAM: BILATERAL LOWER EXTREMITY  VENOUS DOPPLER ULTRASOUND TECHNIQUE: Gray-scale sonography with graded  compression, as well as color Doppler and duplex ultrasound were performed to evaluate the lower extremity deep venous systems from the level of the common femoral vein and including the common femoral, femoral, profunda femoral, popliteal and calf veins including the posterior tibial, peroneal and gastrocnemius veins when visible. The superficial great saphenous vein was also interrogated. Spectral Doppler was utilized to evaluate flow at rest and with distal augmentation maneuvers in the common femoral, femoral and popliteal veins. COMPARISON:  None. FINDINGS: RIGHT LOWER EXTREMITY Common Femoral Vein: No evidence of thrombus. Normal compressibility, respiratory phasicity and response to augmentation. Saphenofemoral Junction: No evidence of thrombus. Normal compressibility and flow on color Doppler imaging. Profunda Femoral Vein: No evidence of thrombus. Normal compressibility and flow on color Doppler imaging. Femoral Vein: No evidence of thrombus. Normal compressibility, respiratory phasicity and response to augmentation. Popliteal Vein: No evidence of thrombus. Normal compressibility, respiratory phasicity and response to augmentation. Calf Veins: No evidence of thrombus. Normal compressibility and flow on color Doppler imaging. Superficial Great Saphenous Vein: No evidence of thrombus. Normal compressibility. Venous Reflux:  None. Other Findings:  None. LEFT LOWER EXTREMITY Common Femoral Vein: No evidence of thrombus. Normal compressibility, respiratory phasicity and response to augmentation. Saphenofemoral Junction: No evidence of thrombus. Normal compressibility and flow on color Doppler imaging. Profunda Femoral Vein: No evidence of thrombus. Normal compressibility and flow on color Doppler imaging. Femoral Vein: No evidence of thrombus. Normal compressibility, respiratory phasicity and response to augmentation. Popliteal Vein: No evidence of thrombus. Normal compressibility, respiratory phasicity and  response to augmentation. Calf Veins: No evidence of thrombus. Normal compressibility and flow on color Doppler imaging. Superficial Great Saphenous Vein: No evidence of thrombus. Normal compressibility. Venous Reflux:  None. Other Findings:  None. IMPRESSION: No evidence of DVT within either lower extremity. Electronically Signed   By: Simonne ComeJohn  Watts M.D.   On: 05/27/2017 13:54   Dg Chest Portable 1 View  Result Date: 05/26/2017 CLINICAL DATA:  Difficulty breathing.  History of CHF. EXAM: PORTABLE CHEST 1 VIEW COMPARISON:  05/30/2016 and 03/29/2016 FINDINGS: Interstitial markings are prominent and raise concern for edema. There are densities at both lung bases which could represent volume loss and pleural fluid. Heart size appears to be upper limits of normal. Question increased densities along the medial left upper lung. Atherosclerotic calcifications at the aortic arch. Negative for a pneumothorax. IMPRESSION: Bibasilar densities are concerning for pleural fluid and basilar atelectasis. Prominent interstitial markings could represent mild edema. Questionable densities along the medial left upper lung. Recommend follow-up two view examination to better characterize this area. Electronically Signed   By: Richarda OverlieAdam  Henn M.D.   On: 05/26/2017 16:47   ASSESSMENT AND PLAN:  This is an 83 year old female admitted for resp failure.  1.  Respiratory failure: Acute on chronic; with hypoxia: likely due to pulmonary edema secondary to CHF/ COPD * Acute on chronic diastolic CHF: last echo showed normal EF, continue lasix, 2.  Sepsis & Pneumonia: Ruled out, Procalcitonin normal 3. COPD: Complicates respiratory failure.  Continue inhaled corticosteroid as well as duo nebs 4.  Arrhythmia: history of Afib (paroxysmal?) Continue amiodarone 5.  Hyperlipidemia: Continue statin therapy       All the records are reviewed and case discussed with Care Management/Social Worker. Management plans discussed with the patient,  family and they are in agreement.  CODE STATUS: Full Code  TOTAL TIME TAKING CARE OF THIS PATIENT: 35 minutes.   More than 50% of  the time was spent in counseling/coordination of care: YES  POSSIBLE D/C IN 1-2 DAYS, DEPENDING ON CLINICAL CONDITION.   Delfino Lovett M.D on 05/27/2017 at 4:35 PM  Between 7am to 6pm - Pager - [phone]  After 6pm go to [email or web address] - Social research officer, government  [facility]  Office  [phone]  CC: Primary care physician; Barron Alvine, MD  Note: This dictation was prepared with Dragon dictation along with smaller phrase technology. Any transcriptional errors that result from this process are unintentional.

## 2017-05-27 NOTE — Discharge Instructions (Signed)
[facility] appointment on June 08, 2017 at 1:20pm with Clarisa Kindredina Tahjir Silveria, FNP. Please call [phone] to reschedule.

## 2017-05-28 ENCOUNTER — Inpatient Hospital Stay: Payer: Medicare Other

## 2017-05-28 DIAGNOSIS — J449 Chronic obstructive pulmonary disease, unspecified: Secondary | ICD-10-CM

## 2017-05-28 DIAGNOSIS — J9622 Acute and chronic respiratory failure with hypercapnia: Secondary | ICD-10-CM

## 2017-05-28 DIAGNOSIS — J441 Chronic obstructive pulmonary disease with (acute) exacerbation: Principal | ICD-10-CM

## 2017-05-28 DIAGNOSIS — Z515 Encounter for palliative care: Secondary | ICD-10-CM

## 2017-05-28 DIAGNOSIS — J9621 Acute and chronic respiratory failure with hypoxia: Secondary | ICD-10-CM

## 2017-05-28 DIAGNOSIS — Z7189 Other specified counseling: Secondary | ICD-10-CM

## 2017-05-28 DIAGNOSIS — J810 Acute pulmonary edema: Secondary | ICD-10-CM

## 2017-05-28 LAB — COMPREHENSIVE METABOLIC PANEL
ALT: 12 U/L — AB (ref 14–54)
ANION GAP: 8 (ref 5–15)
AST: 18 U/L (ref 15–41)
Albumin: 2.6 g/dL — ABNORMAL LOW (ref 3.5–5.0)
Alkaline Phosphatase: 77 U/L (ref 38–126)
BUN: 20 mg/dL (ref 6–20)
CALCIUM: 8.8 mg/dL — AB (ref 8.9–10.3)
CO2: 40 mmol/L — AB (ref 22–32)
CREATININE: 0.96 mg/dL (ref 0.44–1.00)
Chloride: 91 mmol/L — ABNORMAL LOW (ref 101–111)
GFR, EST NON AFRICAN AMERICAN: 54 mL/min — AB (ref >60)
Glucose, Bld: 111 mg/dL — ABNORMAL HIGH (ref 65–99)
Potassium: 3.8 mmol/L (ref 3.5–5.1)
SODIUM: 139 mmol/L (ref 135–145)
TOTAL PROTEIN: 6.2 g/dL — AB (ref 6.5–8.1)
Total Bilirubin: 0.7 mg/dL (ref 0.3–1.2)

## 2017-05-28 LAB — CBC
HCT: 29.5 % — ABNORMAL LOW (ref 35.0–47.0)
Hemoglobin: 9.6 g/dL — ABNORMAL LOW (ref 12.0–16.0)
MCH: 29.1 pg (ref 26.0–34.0)
MCHC: 32.5 g/dL (ref 32.0–36.0)
MCV: 89.5 fL (ref 80.0–100.0)
PLATELETS: 156 10*3/uL (ref 150–440)
RBC: 3.3 MIL/uL — AB (ref 3.80–5.20)
RDW: 15.3 % — AB (ref 11.5–14.5)
WBC: 8.5 10*3/uL (ref 3.6–11.0)

## 2017-05-28 LAB — LEGIONELLA PNEUMOPHILA SEROGP 1 UR AG: L. PNEUMOPHILA SEROGP 1 UR AG: NEGATIVE

## 2017-05-28 LAB — PROCALCITONIN: Procalcitonin: <0.1 ng/mL

## 2017-05-28 MED ORDER — ALBUTEROL SULFATE (2.5 MG/3ML) 0.083% IN NEBU: 2.5000 mg | INHALATION_SOLUTION | RESPIRATORY_TRACT | Status: DC | PRN

## 2017-05-28 MED ORDER — MORPHINE SULFATE (PF) 2 MG/ML IV SOLN
2.0000 mg | INTRAVENOUS | Status: DC | PRN
  Administered 2017-05-28: 2 mg via INTRAVENOUS
  Filled 2017-05-28: qty 1

## 2017-05-28 MED ORDER — POTASSIUM CHLORIDE CRYS ER 20 MEQ PO TBCR
40.0000 meq | EXTENDED_RELEASE_TABLET | Freq: Once | ORAL | Status: AC
  Administered 2017-05-28: 40 meq via ORAL
  Filled 2017-05-28: qty 2

## 2017-05-28 MED ORDER — MORPHINE SULFATE (PF) 2 MG/ML IV SOLN: 2.0000 mg | INTRAVENOUS | Status: DC | PRN

## 2017-05-28 MED ORDER — FUROSEMIDE 10 MG/ML IJ SOLN
40.0000 mg | Freq: Once | INTRAMUSCULAR | Status: AC
  Administered 2017-05-28: 40 mg via INTRAVENOUS
  Filled 2017-05-28: qty 4

## 2017-05-28 NOTE — Progress Notes (Signed)
[employer] [facility] - [place] at [facility]   PATIENT NAME: Dana [name]    MR#:  [id number]  DATE OF BIRTH:  11/11/1932  SUBJECTIVE:  CHIEF COMPLAINT:   Chief Complaint  Patient presents with  . Shortness of Breath  on BiPAP, hypotensive REVIEW OF SYSTEMS:  Review of Systems  Constitutional: Negative for chills, fever and weight loss.  HENT: Negative for nosebleeds and sore throat.   Eyes: Negative for blurred vision.  Respiratory: Positive for shortness of breath. Negative for cough and wheezing.   Cardiovascular: Negative for chest pain, orthopnea, leg swelling and PND.  Gastrointestinal: Negative for abdominal pain, constipation, diarrhea, heartburn, nausea and vomiting.  Genitourinary: Negative for dysuria and urgency.  Musculoskeletal: Negative for back pain.  Skin: Negative for rash.  Neurological: Negative for dizziness, speech change, focal weakness and headaches.  Endo/Heme/Allergies: Does not bruise/bleed easily.  Psychiatric/Behavioral: Negative for depression.    DRUG ALLERGIES:  No Known Allergies VITALS:  Blood pressure (!) 88/47, pulse 73, temperature 99.3 F (37.4 C), resp. rate 20, height 4\' 7"  (1.397 m), weight 70 kg (154 lb 5.2 oz), SpO2 95 %. PHYSICAL EXAMINATION:  Physical Exam  Constitutional: She is oriented to person, place, and time and well-developed, well-nourished, and in no distress.  HENT:  Head: Normocephalic and atraumatic.  Eyes: Conjunctivae and EOM are normal. Pupils are equal, round, and reactive to light.  Neck: Normal range of motion. Neck supple. No tracheal deviation present. No thyromegaly present.  Cardiovascular: Normal rate, regular rhythm and normal heart sounds.  Pulmonary/Chest: Tachypnea noted. She is in respiratory distress. She has wheezes. She exhibits no tenderness.  On BiPAP  Abdominal: Soft. Bowel sounds are normal. She exhibits no distension. There is no tenderness.  Musculoskeletal: Normal range of  motion.  Neurological: She is alert and oriented to person, place, and time. No cranial nerve deficit.  Skin: Skin is warm and dry. No rash noted.  Psychiatric: Mood and affect normal.   LABORATORY PANEL:  Female CBC Recent Labs  Lab 05/28/17 0607  WBC 8.5  HGB 9.6*  HCT 29.5*  PLT 156   ------------------------------------------------------------------------------------------------------------------ Chemistries  Recent Labs  Lab 05/28/17 0607  NA 139  K 3.8  CL 91*  CO2 40*  GLUCOSE 111*  BUN 20  CREATININE 0.96  CALCIUM 8.8*  AST 18  ALT 12*  ALKPHOS 77  BILITOT 0.7   RADIOLOGY:  Dg Chest Port 1 View  Result Date: 05/28/2017 CLINICAL DATA:  Respiratory failure EXAM: PORTABLE CHEST 1 VIEW COMPARISON:  05/26/2017 FINDINGS: Progression of heart failure with edema. Progression of bilateral effusions and bibasilar atelectasis. Cardiac enlargement. Left upper lobe linear density has improved. IMPRESSION: Progressive heart failure and edema. Progression of bilateral pleural effusions. Electronically Signed   By: Marlan Palauharles  Clark M.D.   On: 05/28/2017 08:23   ASSESSMENT AND PLAN:  This is an 83 year old female admitted for resp failure.  1.  Respiratory failure: Acute on chronic; with hypoxia: likely due to pulmonary edema secondary to CHF/ COPD, still requiring BiPAP * Acute on chronic diastolic CHF: last echo showed normal EF, continue lasix, 2.  Sepsis & Pneumonia: Ruled out, Procalcitonin normal 3. COPD: Complicates respiratory failure.  Continue inhaled corticosteroid as well as duo nebs 4.  Arrhythmia: history of Afib (paroxysmal?) Continue amiodarone 5.  Hyperlipidemia: Continue statin therapy    Family would like Full care per Palliative care meeting with Daughter.   All the records are reviewed and case discussed with Care Management/Social Worker.  Management plans discussed with the patient, nursing and they are in agreement.  CODE STATUS: Full Code  TOTAL  TIME TAKING CARE OF THIS PATIENT: 15 minutes.   More than 50% of the time was spent in counseling/coordination of care: YES  POSSIBLE D/C IN 3-4 DAYS, DEPENDING ON CLINICAL CONDITION.   Delfino LovettVipul Tripton Ned M.D on 05/28/2017 at 3:23 PM  Between 7am to 6pm - Pager - [phone]  After 6pm go to [email or web address] - Social research officer, governmentpassword [email or web address]  [facility] [place] [facility]  Office  [phone]  CC: Primary care physician; Barron AlvineGibson, David, MD  Note: This dictation was prepared with Dragon dictation along with smaller phrase technology. Any transcriptional errors that result from this process are unintentional.

## 2017-05-28 NOTE — Consult Note (Signed)
Consultation Note Date: 05/28/2017   Patient Name: Dana [name]  DOB: 01/10/1933  MRN: [id number]  Age / Sex: 83 y.o., female  PCP: Barron AlvineGibson, David, MD Referring Physician: Delfino LovettShah, Vipul, MD  Reason for Consultation: Establishing goals of care and Psychosocial/spiritual support  HPI/Patient Profile: 83 y.o. female  admitted on 05/26/2017 with  past medical history of COPD and CHF  admitted through the emergency department via EMS complaining of shortness of breath.    The patient states that she did not take her Lasix today because she was trying to prepare for a doctor's appointment.  She was found to have oxygen saturations of 68% on her usual 3 L of oxygen via nasal cannula at home.   She was placed on CPAP in the field and transition to BiPAP in the emergency department.  Chest x-ray showed pulmonary edema as well as likely pneumonia. Currently remains on BiPAP and is on able to transition to nasal cannula at this time without desaturation.  Patient and family face advance directive decisions, treatment option decisions and anticipatory care needs.   Clinical Assessment and Goals of Care:  This NP Lorinda CreedMary Larach reviewed medical records, received report from team, assessed the patient and then meet at the patient's bedside along with her daughter Dana [name]  to discuss diagnosis, prognosis, GOC, EOL wishes disposition and options.  A detailed discussion was had today regarding advanced directives.  Concepts specific to code status, artifical feeding and hydration, continued IV antibiotics and rehospitalization was had.  The difference between a aggressive medical intervention path  and a palliative comfort care path for this patient at this time was had.  Values and goals of care important to patient and family were attempted to be elicited.   Patient is too weak to participate in conversation.   Bedside nurse reports the patient communicated to her that she would not want to be intubated, however at this time she cannot participate in this conversation and her daughter is requesting a full CODE STATUS. Daughter tells me that she was intubated 1 year ago in spite of medical providers recommendation to not intubate and "here she is a year later"  I believe the daughter understands the seriousness of the situation, at this time she remains hopeful for improvement and needs more time to see if her mother can bounce back to baseline.   Concept of Hospice and Palliative Care were discussed   Questions and concerns addressed.   Family encouraged to call with questions or concerns.  PMT will continue to support holistically.   NEXT OF KIN/ two daughters    SUMMARY OF RECOMMENDATIONS    Code Status/Advance Care Planning:  Full code  Encouraged family to consider DNR/DNI status knowing poor outcomes in similar patients.   Palliative Prophylaxis:   Aspiration, Delirium Protocol, Frequent Pain Assessment and Oral Care  Additional Recommendations (Limitations, Scope, Preferences):  Full Scope Treatment  Psycho-social/Spiritual:   Desire for further Chaplaincy support:no   Prognosis:   Unable to determine  Discharge  Planning: To Be Determined      Primary Diagnoses: Present on Admission: . Sepsis (HCC)   I have reviewed the medical record, interviewed the patient and family, and examined the patient. The following aspects are pertinent.  Past Medical History:  Diagnosis Date  . CHF (congestive heart failure) (HCC)   . COPD (chronic obstructive pulmonary disease) (HCC)    Social History   Socioeconomic History  . Marital status: Widowed    Spouse name: None  . Number of children: None  . Years of education: None  . Highest education level: None  Social Needs  . Financial resource strain: None  . Food insecurity - worry: None  . Food insecurity - inability:  None  . Transportation needs - medical: None  . Transportation needs - non-medical: None  Occupational History  . None  Tobacco Use  . Smoking status: Current Every Day Smoker    Packs/day: 1.00    Types: Cigarettes  . Smokeless tobacco: Never Used  Substance and Sexual Activity  . Alcohol use: No  . Drug use: No  . Sexual activity: None  Other Topics Concern  . None  Social History Narrative  . None   Family History  Problem Relation Age of Onset  . Cancer Mother   . COPD Father    Scheduled Meds: . amiodarone  200 mg Oral Daily  . aspirin  81 mg Oral Daily  . atorvastatin  40 mg Oral QPM  . budesonide  0.5 mg Nebulization BID  . Chlorhexidine Gluconate Cloth  6 each Topical Q0600  . docusate sodium  100 mg Oral BID  . enoxaparin (LOVENOX) injection  40 mg Subcutaneous Q24H  . famotidine  20 mg Oral QHS  . feeding supplement (ENSURE ENLIVE)  237 mL Oral BID BM  . ipratropium-albuterol  3 mL Nebulization Q6H  . mouth rinse  15 mL Mouth Rinse BID  . mupirocin ointment  1 application Nasal BID  . saccharomyces boulardii  250 mg Oral BID   Continuous Infusions: PRN Meds:.acetaminophen **OR** acetaminophen, albuterol, morphine injection, [DISCONTINUED] ondansetron **OR** ondansetron (ZOFRAN) IV Medications Prior to Admission:  Prior to Admission medications   Medication Sig Start Date End Date Taking? Authorizing Provider  albuterol (PROVENTIL HFA;VENTOLIN HFA) 108 (90 Base) MCG/ACT inhaler Inhale 2 puffs into the lungs every 6 (six) hours as needed for wheezing. 02/13/16 05/26/17 Yes [provider]  aspirin 81 MG chewable tablet Chew 1 tablet (81 mg total) by mouth daily. 04/01/16  Yes Regalado, Belkys A, MD  atorvastatin (LIPITOR) 40 MG tablet Take 40 mg by mouth every evening.   Yes [provider]  ergocalciferol (VITAMIN D2) 50000 units capsule Take 50,000 Units by mouth once a week.   Yes [provider]  feeding supplement, ENSURE ENLIVE,  (ENSURE ENLIVE) LIQD Take 237 mLs by mouth 2 (two) times daily between meals. 04/01/16  Yes Regalado, Belkys A, MD  furosemide (LASIX) 20 MG tablet Take 60 mg by mouth.   Yes [provider]  gabapentin (NEURONTIN) 100 MG capsule Take 200 mg by mouth 3 (three) times daily.   Yes [provider]  ipratropium-albuterol (DUONEB) 0.5-2.5 (3) MG/3ML SOLN Take 3 mLs by nebulization every 6 (six) hours. 04/01/16  Yes Regalado, Belkys A, MD  acetaminophen (TYLENOL) 325 MG tablet Take 325-650 mg by mouth every 6 (six) hours as needed for headache.    [provider]  amiodarone (PACERONE) 200 MG tablet Take 1 tablet (200 mg total) by  mouth daily. Patient not taking: Reported on 05/26/2017 04/01/16   Regalado, Jon Billings A, MD  budesonide (PULMICORT) 0.5 MG/2ML nebulizer solution Take 2 mLs (0.5 mg total) by nebulization 2 (two) times daily. 04/01/16   Regalado, Belkys A, MD  famotidine (PEPCID) 20 MG tablet Take 1 tablet (20 mg total) by mouth at bedtime. Patient not taking: Reported on 05/26/2017 04/01/16   Regalado, Jon Billings A, MD  nicotine (NICODERM CQ - DOSED IN MG/24 HOURS) 21 mg/24hr patch Place 1 patch (21 mg total) onto the skin daily. Patient not taking: Reported on 05/26/2017 04/01/16   Regalado, Jon Billings A, MD  predniSONE (DELTASONE) 20 MG tablet Take 40 mg for 5 days. Patient not taking: Reported on 05/26/2017 04/01/16   Regalado, Jon Billings A, MD  saccharomyces boulardii (FLORASTOR) 250 MG capsule Take 1 capsule (250 mg total) by mouth 2 (two) times daily. Patient not taking: Reported on 05/26/2017 04/01/16   Regalado, Jon Billings A, MD  senna-docusate (SENOKOT-S) 8.6-50 MG tablet Take 1 tablet by mouth 2 (two) times daily. 04/01/16   Regalado, Belkys A, MD   No Known Allergies Review of Systems  Unable to perform ROS: Acuity of condition    Physical Exam  Constitutional: She appears well-developed.  Cardiovascular: Normal rate and regular rhythm.  Pulmonary/Chest: Tachypnea noted. She  has decreased breath sounds.  -currently ion BiPap  Abdominal: Soft.  Skin: Skin is warm and dry.    Vital Signs: BP 108/61 (BP Location: Left Arm)   Pulse 75   Temp 99.3 F (37.4 C) (Bladder)   Resp (!) 23   Ht 4\' 7"  (1.397 m)   Wt 70 kg (154 lb 5.2 oz)   SpO2 93%   BMI 35.87 kg/m  Pain Assessment: No/denies pain   Pain Score: 0-No pain   SpO2: SpO2: 93 % O2 Device:SpO2: 93 % O2 Flow Rate: .O2 Flow Rate (L/min): 14 L/min  IO: Intake/output summary:   Intake/Output Summary (Last 24 hours) at 05/28/2017 1237 Last data filed at 05/28/2017 1200 Gross per 24 hour  Intake -  Output 2210 ml  Net -2210 ml    LBM: Last BM Date: 05/27/17 Baseline Weight: Weight: 73.5 kg (162 lb) Most recent weight: Weight: 70 kg (154 lb 5.2 oz)     Palliative Assessment/Data: 30 % currently   Discussed  with Dr. Sherryll Burger  Time In: 1300 Time Out: 1415 Time Total: 75 min Greater than 50%  of this time was spent counseling and coordinating care related to the above assessment and plan.  Signed by: Lorinda Creed, NP   Please contact Palliative Medicine Team phone at [phone] for questions and concerns.  For individual provider: See Loretha Stapler

## 2017-05-28 NOTE — Progress Notes (Signed)
Pt unable to stay off bipap for appreciable time this morning; O2 sats are acceptable at 87-92% on 6 liters, but she reports air hunger and "cannot breathe."  Meds given whole with applesauce and pt did not eat breakfast, took a few sips of Ensure.   At approximately 1055 pt's daughter became concerned and called writer to room; pt reporting she could not breathe and was pulling at mask.  Appeared air hungry and very anxious.  FIO2 increased to 50% and Clinical research associatewriter asked NP for morphine order.  Order placed and 2 mgs given.  Pt much calmer at this time and appears to be resting comfortably with FIO2 returned to 40%.

## 2017-05-28 NOTE — Progress Notes (Signed)
Unable to tolerate off BiPAP this a.m. Somnolent but awakens and responds appropriately No overt distress on BiPAP  Vitals:   05/28/17 0600 05/28/17 0700 05/28/17 0800 05/28/17 0900  BP: (!) 100/36 (!) 95/50 (!) 115/45 (!) 111/48  Pulse: 84 78 79 75  Resp: (!) 23 (!) 24 (!) 27 (!) 26  Temp: 98.4 F (36.9 C) 98.6 F (37 C) 98.8 F (37.1 C) 98.8 F (37.1 C)  TempSrc:   Bladder   SpO2: 96% 94% 91% 94%  Weight:      Height:        NAD HEENT WNL JVP cannot be visualized Markedly diminished breath sounds throughout, faint bibasilar crackles No wheezes noted Regular, no M NABS, soft LE edema improved Chronic stasis changes No focal neurologic deficits  BMP Latest Ref Rng & Units 05/28/2017 05/26/2017 04/01/2016  Glucose 65 - 99 mg/dL 161(W111(H) 960(A130(H) 540(J103(H)  BUN 6 - 20 mg/dL 20 19 81(X31(H)  Creatinine 0.44 - 1.00 mg/dL 9.140.96 7.820.87 9.560.96  Sodium 135 - 145 mmol/L 139 142 140  Potassium 3.5 - 5.1 mmol/L 3.8 3.5 5.0  Chloride 101 - 111 mmol/L 91(L) 91(L) 100(L)  CO2 22 - 32 mmol/L 40(H) 37(H) 36(H)  Calcium 8.9 - 10.3 mg/dL 2.1(H8.8(L) 9.1 0.8(M8.5(L)    CBC Latest Ref Rng & Units 05/28/2017 05/26/2017 04/01/2016  WBC 3.6 - 11.0 K/uL 8.5 9.0 23.9(H)  Hemoglobin 12.0 - 16.0 g/dL 5.7(Q9.6(L) 10.8(L) 9.6(L)  Hematocrit 35.0 - 47.0 % 29.5(L) 34.2(L) 32.2(L)  Platelets 150 - 440 K/uL 156 190 285    CXR: Interstitial prominence (likely edema) with R > L hazy opacities (likely effusions)  LE venous ultrasound: No evidence of DVT  PCT < 0.10 x 2  IMPRESSION: Acute on chronic hypercarbic/hypoxemic respiratory failure Pulm edema pattern on CXR H/O COPD on chronic O2 H/O dCHF Improved chronic LE edema  Chronic LE venous stasis changes   She is chronically ill and markedly limited at her baseline.  Her best day, she is limited to ambulation.   PLAN/REC: Continue BiPAP as needed Continue supplemental oxygen Continue nebulized steroids and bronchodilators Furosemide 40 mg X 1 (with potassium  supplementation) Needs to remain in ICU/SDU until fully liberated from BiPAP Palliative care consultation requested 11/29 -I regard her as a poor candidate for intubation or ACLS  Billy Fischeravid Janal Haak, MD PCCM service Mobile [phone] Pager [phone] 05/28/2017 11:05 AM

## 2017-05-28 NOTE — Progress Notes (Signed)
Pt requested to be placed back on Bipap. Pt tol well at this time.Will cont to monitor throughout the night.

## 2017-05-29 ENCOUNTER — Inpatient Hospital Stay: Payer: Medicare Other

## 2017-05-29 ENCOUNTER — Inpatient Hospital Stay
Admit: 2017-05-29 | Discharge: 2017-05-29 | Disposition: A | Discharge status: Home / Self Care | Payer: Medicare Other | Attending: Pulmonary Disease | Admitting: Pulmonary Disease

## 2017-05-29 LAB — BASIC METABOLIC PANEL
Anion gap: 7 (ref 5–15)
BUN: 20 mg/dL (ref 6–20)
CHLORIDE: 92 mmol/L — AB (ref 101–111)
CO2: 42 mmol/L — AB (ref 22–32)
CREATININE: 0.82 mg/dL (ref 0.44–1.00)
Calcium: 8.9 mg/dL (ref 8.9–10.3)
GFR calc Af Amer: >60 mL/min (ref >60)
GFR calc non Af Amer: >60 mL/min (ref >60)
GLUCOSE: 95 mg/dL (ref 65–99)
Potassium: 4.6 mmol/L (ref 3.5–5.1)
SODIUM: 141 mmol/L (ref 135–145)

## 2017-05-29 LAB — GLUCOSE, CAPILLARY
Glucose-Capillary: 140 mg/dL — ABNORMAL HIGH (ref 65–99)
Glucose-Capillary: 146 mg/dL — ABNORMAL HIGH (ref 65–99)
Glucose-Capillary: 163 mg/dL — ABNORMAL HIGH (ref 65–99)
Glucose-Capillary: 251 mg/dL — ABNORMAL HIGH (ref 65–99)

## 2017-05-29 LAB — CBC
HEMATOCRIT: 30.4 % — AB (ref 35.0–47.0)
Hemoglobin: 9.8 g/dL — ABNORMAL LOW (ref 12.0–16.0)
MCH: 29.1 pg (ref 26.0–34.0)
MCHC: 32.3 g/dL (ref 32.0–36.0)
MCV: 90.1 fL (ref 80.0–100.0)
PLATELETS: 160 10*3/uL (ref 150–440)
RBC: 3.37 MIL/uL — ABNORMAL LOW (ref 3.80–5.20)
RDW: 15.2 % — AB (ref 11.5–14.5)
WBC: 7.7 10*3/uL (ref 3.6–11.0)

## 2017-05-29 LAB — BLOOD GAS, ARTERIAL
ACID-BASE EXCESS: 24 mmol/L — AB (ref 0.0–2.0)
BICARBONATE: 52.6 mmol/L — AB (ref 20.0–28.0)
FIO2: 0.44
O2 SAT: 98.6 %
PCO2 ART: 83 mmHg — AB (ref 32.0–48.0)
PH ART: 7.41 (ref 7.350–7.450)
PO2 ART: 118 mmHg — AB (ref 83.0–108.0)
Patient temperature: 37

## 2017-05-29 LAB — ECHOCARDIOGRAM COMPLETE
Height: 55 in
Weight: 2493.84 oz

## 2017-05-29 LAB — POTASSIUM: Potassium: 4.7 mmol/L (ref 3.5–5.1)

## 2017-05-29 LAB — PROCALCITONIN: Procalcitonin: <0.1 ng/mL

## 2017-05-29 MED ORDER — METHYLPREDNISOLONE SODIUM SUCC 40 MG IJ SOLR
40.0000 mg | Freq: Three times a day (TID) | INTRAMUSCULAR | Status: DC
  Administered 2017-05-29 – 2017-05-31 (×7): 40 mg via INTRAVENOUS
  Filled 2017-05-29 (×6): qty 1

## 2017-05-29 MED ORDER — FUROSEMIDE 10 MG/ML IJ SOLN
40.0000 mg | Freq: Once | INTRAMUSCULAR | Status: AC
  Administered 2017-05-29: 40 mg via INTRAVENOUS
  Filled 2017-05-29: qty 4

## 2017-05-29 MED ORDER — INSULIN ASPART 100 UNIT/ML ~~LOC~~ SOLN
0.0000 [IU] | SUBCUTANEOUS | Status: DC
  Administered 2017-05-29: 5 [IU] via SUBCUTANEOUS
  Administered 2017-05-29 – 2017-05-30 (×3): 1 [IU] via SUBCUTANEOUS
  Administered 2017-05-30: 2 [IU] via SUBCUTANEOUS
  Administered 2017-05-30 (×2): 1 [IU] via SUBCUTANEOUS
  Administered 2017-05-30 (×2): 2 [IU] via SUBCUTANEOUS
  Administered 2017-05-31 – 2017-06-01 (×7): 1 [IU] via SUBCUTANEOUS
  Filled 2017-05-29 (×15): qty 1

## 2017-05-29 MED ORDER — MORPHINE SULFATE (PF) 2 MG/ML IV SOLN: 2.0000 mg | INTRAVENOUS | Status: DC | PRN

## 2017-05-29 MED ORDER — FUROSEMIDE 40 MG PO TABS
40.0000 mg | ORAL_TABLET | Freq: Every day | ORAL | Status: DC
  Administered 2017-05-30 – 2017-06-01 (×3): 40 mg via ORAL
  Filled 2017-05-29: qty 2
  Filled 2017-05-29 (×2): qty 1

## 2017-05-29 NOTE — Progress Notes (Signed)
Pt started on IV steroid this AM, we will not start CBGs at this time per RPt and Dr Welton FlakesKhan; we will monitor labs and if CBGs are indicated we may start them tomorrow

## 2017-05-29 NOTE — Progress Notes (Signed)
[facility]     Date: 05/29/2017,   MRN# [id number] Dana [name] 09/15/1932 Code Status:     Code Status Orders  (From admission, onward)        Start     Ordered   05/26/17 2035  Full code  Continuous     05/26/17 2034    Code Status History    Date Active Date Inactive Code Status Order ID Comments User Context   03/24/2016 16:47 04/01/2016 20:42 Full Code [id number]  de [name], Dana CorkJose Angelo A, MD Inpatient     Hosp day:@LENGTHOFSTAYDAYS @ Referring MD: @ATDPROV @     PCP:      AdmissionWeight: 162 lb (73.5 kg)                 CurrentWeight: 155 lb 13.8 oz (70.7 kg) Dana Gaveleggy [name] [name] is [name] 83 y.o. old female      SUBJECTIVE:   Was taken off NIPPV yesterday evening but had to be placed back on it for increased WOB. No acute issues overnight. Tolerated BiPAP.  Was taken off BiPAP this morning. Tolerating O2 via  at 6LPM. Denies chest pain, SOB. Hemodynamically stable    MEDICATIONS      Current Medication:   Current Facility-Administered Medications:  .  acetaminophen (TYLENOL) tablet 650 mg, 650 mg, Oral, Q6H PRN **OR** acetaminophen (TYLENOL) suppository 650 mg, 650 mg, Rectal, Q6H PRN, Arnaldo Nataliamond, Dana S, MD .  albuterol (PROVENTIL) (2.5 MG/3ML) 0.083% nebulizer solution 2.5 mg, 2.5 mg, Nebulization, Q3H PRN, Merwyn KatosSimonds, Dana B, MD .  amiodarone (PACERONE) tablet 200 mg, 200 mg, Oral, Daily, Arnaldo Nataliamond, Dana S, MD, 200 mg at 05/29/17 0915 .  aspirin chewable tablet 81 mg, 81 mg, Oral, Daily, Arnaldo Nataliamond, Dana S, MD, 81 mg at 05/29/17 0915 .  atorvastatin (LIPITOR) tablet 40 mg, 40 mg, Oral, QPM, Arnaldo Nataliamond, Dana S, MD, 40 mg at 05/28/17 1703 .  budesonide (PULMICORT) nebulizer solution 0.5 mg, 0.5 mg, Nebulization, BID, Arnaldo Nataliamond, Dana S, MD, 0.5 mg at 05/29/17 0740 .  Chlorhexidine Gluconate Cloth 2 % PADS 6 each, 6 each, Topical, Q0600, Varughese, Bincy S, NP, 6 each at 05/28/17 0524 .  docusate sodium (COLACE) capsule 100 mg,  100 mg, Oral, BID, Arnaldo Nataliamond, Dana S, MD, 100 mg at 05/29/17 0915 .  enoxaparin (LOVENOX) injection 40 mg, 40 mg, Subcutaneous, Q24H, Arnaldo Nataliamond, Dana S, MD, 40 mg at 05/28/17 2120 .  famotidine (PEPCID) tablet 20 mg, 20 mg, Oral, QHS, Arnaldo Nataliamond, Dana S, MD, 20 mg at 05/28/17 2120 .  feeding supplement (ENSURE ENLIVE) (ENSURE ENLIVE) liquid 237 mL, 237 mL, Oral, BID BM, Arnaldo Nataliamond, Dana S, MD, 237 mL at 05/29/17 0916 .  furosemide (LASIX) injection 40 mg, 40 mg, Intravenous, Once, Dana [name], Dana Damron, MD .  ipratropium-albuterol (DUONEB) 0.5-2.5 (3) MG/3ML nebulizer solution 3 mL, 3 mL, Nebulization, Q6H, Arnaldo Nataliamond, Dana S, MD, 3 mL at 05/29/17 0740 .  MEDLINE mouth rinse, 15 mL, Mouth Rinse, BID, Arnaldo Nataliamond, Dana S, MD, 15 mL at 05/29/17 0915 .  methylPREDNISolone sodium succinate (SOLU-MEDROL) 40 mg/mL injection 40 mg, 40 mg, Intravenous, Q8H, Dana [name], Dana Asch, MD, 40 mg at 05/29/17 1052 .  morphine 2 MG/ML injection 2-4 mg, 2-4 mg, Intravenous, Q3H PRN, Tukov, Dana S, NP .  mupirocin ointment (BACTROBAN) 2 % 1 application, 1 application, Nasal, BID, Varughese, Bincy S, NP, 1 application at 05/29/17 0923 .  [DISCONTINUED] ondansetron (ZOFRAN) tablet 4 mg, 4 mg, Oral, Q6H PRN **OR** ondansetron (ZOFRAN) injection 4 mg, 4 mg, Intravenous, Q6H  PRN, Arnaldo Natal, MD .  saccharomyces boulardii [facility]) capsule 250 mg, 250 mg, Oral, BID, Arnaldo Natal, MD, 250 mg at 05/29/17 0915    VS: BP 139/73   Pulse 83   Temp 98.2 F (36.8 C)   Resp (!) 24   Ht 4\' 7"  (1.397 m)   Wt 155 lb 13.8 oz (70.7 kg)   SpO2 100%   BMI 36.23 kg/m      PHYSICAL EXAM   Physical Exam   Awake, alert. Hard of hearing. Follows commands. Sclera: Anicteric CVS: S1, S2 0 Chest: Bilateral wheezes with occasional crackles. No rhonchi Abd: soft, benign. Non tender LE: bilateral erythema with edema. Though today is the first time I am examining her, the edema seems to be improving as the skin is wrinkled       LABS    Recent Labs    05/26/17 1627 05/28/17 0607 05/29/17 0453  HGB 10.8* 9.6* 9.8*  HCT 34.2* 29.5* 30.4*  MCV 91.4 89.5 90.1  WBC 9.0 8.5 7.7  BUN 19 20 20   CREATININE 0.87 0.96 0.82  GLUCOSE 130* 111* 95  CALCIUM 9.1 8.8* 8.9  INR 0.90  --   --   ,    No results for input([name]): PH in the last 72 hours.  Invalid input([name]): PCO2, PO2, BASEEXCESS, BASEDEFICITE, TFT    CULTURE RESULTS   Recent Results (from the past 240 hour([name]))  Blood culture (routine x 2)     Status: None (Preliminary result)   Collection Time: 05/26/17  4:20 PM  Result Value Ref Range Status   Specimen Description BLOOD BLOOD RIGHT WRIST  Final   Special Requests   Final    BOTTLES DRAWN AEROBIC AND ANAEROBIC Blood Culture adequate volume   Culture NO GROWTH 3 DAYS  Final   Report Status PENDING  Incomplete  Blood culture (routine x 2)     Status: None (Preliminary result)   Collection Time: 05/26/17  4:28 PM  Result Value Ref Range Status   Specimen Description BLOOD BLOOD RIGHT ARM  Final   Special Requests   Final    BOTTLES DRAWN AEROBIC AND ANAEROBIC Blood Culture results may not be optimal due to an excessive volume of blood received in culture bottles   Culture NO GROWTH 3 DAYS  Final   Report Status PENDING  Incomplete  MRSA PCR Screening     Status: Abnormal   Collection Time: 05/26/17  8:31 PM  Result Value Ref Range Status   MRSA by PCR POSITIVE ([name]) NEGATIVE Final    Comment:        The GeneXpert MRSA Assay (FDA approved for NASAL specimens only), is one component of [name] comprehensive MRSA colonization surveillance program. It is not intended to diagnose MRSA infection nor to guide or monitor treatment for MRSA infections. RESULT CALLED TO, READ BACK BY AND VERIFIED WITH: TESS THOMAS ON 05/26/17 AT 2241 BY JAG           IMAGING    Dg Chest Port 1 View  Result Date: 05/29/2017 CLINICAL DATA:  Respiratory failure. EXAM: PORTABLE CHEST 1 VIEW COMPARISON:   05/28/2017. FINDINGS: Mediastinum hilar structures are normal. Cardiomegaly with bilateral from interstitial prominence and bilateral pleural effusions consistent CHF. No change from prior exam. IMPRESSION: Congestive heart failure pulmonary interstitial edema and bilateral pleural effusions. No significant change from prior exam. Electronically Signed   By: Maisie Fus  Register   On: 05/29/2017 09:26      ASSESSMENT/PLAN  83 y/o lady with COPD, chronic hypoxemic resp failure - on 3L home O2, histroic diastolic CHF, Hx of tobacco abuse admitted with:  Acute on chronic hypoxemic respiratory failure Acute hypercapnic respiratory failure Possible dCHF with exacerbation COPD with exacerbation Hx of tobacco abuse Chronic bil LE venous stasis  Troponin, procalcitonin, Strep pneumo, legionella, influenza and blood cultures negative. LE duplex negative. Initially treated with vanc and zosyn in the ED. Transitioned to rocephin and azithromycin in the ICU that has since been stopped.  Getting diuresed.   Overall slowly doing better with improved respiratory function.  Plan: NIPPV PRN during the day but will continue it scheduled at night. Continue scheduled duonebs and BID pulmicort. PRN albuterol Given bilateral wheezing on exam, will start methylprednisone 40 mg Q8h. Continue diueresis. Patient is on lasix at home. Will schedule daily lasix in AM and give [name] dose in there afternoon if needed. Goals is to make her net 1.5 - 2 L negative daily. Lasix 40 mg IV once this afternoon. Check potassium in PM given aggressive diuresis. Check echocardiogram  ABG this morning showed compensated hypercapnea with PCO2 of 83. Review of old ABGs from 2017 show her PCO2 to be in the 40s - 60s.  Will keep an eye for now and repeat an ABG if clinically indicated.   Lovenox for DVT prophylaxis Pepcid for SUP. Insulin sliding scale now that the patient is on IV steroids.   Code: Full.     Dana HiltsIrtaza Lachlyn Vanderstelt,  M.D.  Pulmonary & Critical Care Medicine

## 2017-05-29 NOTE — Progress Notes (Signed)
*  PRELIMINARY RESULTS* Echocardiogram 2D Echocardiogram has been performed.  Cristela BlueHege, Zuly Belkin 05/29/2017, 2:14 PM

## 2017-05-29 NOTE — Progress Notes (Signed)
[employer] [facility] [place] [facility]   PATIENT NAME: Dana [name]    MR#:  [id number]  DATE OF BIRTH:  02/11/1933  SUBJECTIVE:  CHIEF COMPLAINT:   Chief Complaint  Patient presents with  . Shortness of Breath  on BiPAP, hypotensive REVIEW OF SYSTEMS:  Review of Systems  Constitutional: Negative for chills, fever and weight loss.  HENT: Negative for nosebleeds and sore throat.   Eyes: Negative for blurred vision.  Respiratory: Positive for shortness of breath. Negative for cough and wheezing.   Cardiovascular: Negative for chest pain, orthopnea, leg swelling and PND.  Gastrointestinal: Negative for abdominal pain, constipation, diarrhea, heartburn, nausea and vomiting.  Genitourinary: Negative for dysuria and urgency.  Musculoskeletal: Negative for back pain.  Skin: Negative for rash.  Neurological: Negative for dizziness, speech change, focal weakness and headaches.  Endo/Heme/Allergies: Does not bruise/bleed easily.  Psychiatric/Behavioral: Negative for depression.   DRUG ALLERGIES:  No Known Allergies VITALS:  Blood pressure (!) 115/57, pulse 87, temperature 98.8 F (37.1 C), resp. rate (!) 21, height 4\' 7"  (1.397 m), weight 70.7 kg (155 lb 13.8 oz), SpO2 93 %. PHYSICAL EXAMINATION:  Physical Exam  Constitutional: She is oriented to person, place, and time and well-developed, well-nourished, and in no distress.  HENT:  Head: Normocephalic and atraumatic.  Eyes: Conjunctivae and EOM are normal. Pupils are equal, round, and reactive to light.  Neck: Normal range of motion. Neck supple. No tracheal deviation present. No thyromegaly present.  Cardiovascular: Normal rate, regular rhythm and normal heart sounds.  Pulmonary/Chest: Tachypnea noted. No respiratory distress. She has no wheezes. She exhibits no tenderness.  Abdominal: Soft. Bowel sounds are normal. She exhibits no distension. There is no tenderness.  Musculoskeletal: Normal range of motion.    Neurological: She is alert and oriented to person, place, and time. No cranial nerve deficit.  Skin: Skin is warm and dry. No rash noted.  Psychiatric: Mood and affect normal.   LABORATORY PANEL:  Female CBC Recent Labs  Lab 05/29/17 0453  WBC 7.7  HGB 9.8*  HCT 30.4*  PLT 160   ------------------------------------------------------------------------------------------------------------------ Chemistries  Recent Labs  Lab 05/28/17 0607 05/29/17 0453  NA 139 141  K 3.8 4.6  CL 91* 92*  CO2 40* 42*  GLUCOSE 111* 95  BUN 20 20  CREATININE 0.96 0.82  CALCIUM 8.8* 8.9  AST 18  --   ALT 12*  --   ALKPHOS 77  --   BILITOT 0.7  --    RADIOLOGY:  Dg Chest Port 1 View  Result Date: 05/29/2017 CLINICAL DATA:  Respiratory failure. EXAM: PORTABLE CHEST 1 VIEW COMPARISON:  05/28/2017. FINDINGS: Mediastinum hilar structures are normal. Cardiomegaly with bilateral from interstitial prominence and bilateral pleural effusions consistent CHF. No change from prior exam. IMPRESSION: Congestive heart failure pulmonary interstitial edema and bilateral pleural effusions. No significant change from prior exam. Electronically Signed   By: Maisie Fushomas  Register   On: 05/29/2017 09:26   ASSESSMENT AND PLAN:  This is an 83 year old female admitted for resp failure.  1.  Respiratory failure: Acute on chronic; with hypoxia: likely due to pulmonary edema secondary to CHF/ COPD Was taken off BiPAP this morning. Tolerating O2 via [place] at 6LPM.  * Acute on chronic diastolic CHF: last echo showed normal EF, continue lasix. NEG 4.2 liters 2.  Sepsis & Pneumonia: Ruled out, Procalcitonin normal 3. COPD: Complicates respiratory failure.  Continue inhaled corticosteroid as well as duo nebs 4.  Arrhythmia: history of Afib (  paroxysmal?) Continue amiodarone 5.  Hyperlipidemia: Continue statin    Family would like Full care per Palliative care meeting with Daughter.   All the records are reviewed and case  discussed with Care Management/Social Worker. Management plans discussed with the patient, nursing and they are in agreement.  CODE STATUS: Full Code  TOTAL TIME TAKING CARE OF THIS PATIENT: 15 minutes.   More than 50% of the time was spent in counseling/coordination of care: YES  POSSIBLE D/C IN 2-3 DAYS, DEPENDING ON CLINICAL CONDITION.   Delfino LovettVipul Gerrell Tabet M.D on 05/29/2017 at 11:48 AM  Between 7am to 6pm - Pager - [phone]  After 6pm go to [email or web address] - Social research officer, governmentpassword EPAS [email or web address]  [facility] [place] [facility]  Office  [phone]  CC: Primary care physician; Barron AlvineGibson, David, MD  Note: This dictation was prepared with Dragon dictation along with smaller phrase technology. Any transcriptional errors that result from this process are unintentional.

## 2017-05-29 NOTE — Care Management Note (Addendum)
Case Management Note  Patient Details  Name: Dana [name] MRN: [id number] Date of Birth: 01/12/1933  Subjective/Objective:                 Patient has chronic home oxygen in the home through [employer]. Admitted to icu stepdown due to need for continuous bipap. Being treated for respiratory failure due to CHF and pneumonia. Receiving IV steroids and has been weaned from bipap to 6 liters oxygen.  She is dyspneic at present  and with little activity tolerance- even with conversation. palliative consulting for goals of care   Action/Plan:  When respiratory status improves, need to mobilize patient to prevent decline in functional status  Expected Discharge Date:                  Expected Discharge Plan:     In-House Referral:     Discharge planning Services     Post Acute Care Choice:    Choice offered to:     DME Arranged:    DME Agency:     HH Arranged:    HH Agency:     Status of Service:     If discussed at [employer] Meetings, dates discussed:    Additional Comments:  Eber HongGreene, Azura Tufaro R, RN 05/29/2017, 2:24 PM

## 2017-05-30 LAB — BASIC METABOLIC PANEL
ANION GAP: 8 (ref 5–15)
BUN: 29 mg/dL — ABNORMAL HIGH (ref 6–20)
CALCIUM: 9.2 mg/dL (ref 8.9–10.3)
CO2: 46 mmol/L — ABNORMAL HIGH (ref 22–32)
Chloride: 88 mmol/L — ABNORMAL LOW (ref 101–111)
Creatinine, Ser: 1.09 mg/dL — ABNORMAL HIGH (ref 0.44–1.00)
GFR, EST AFRICAN AMERICAN: 53 mL/min — AB (ref >60)
GFR, EST NON AFRICAN AMERICAN: 46 mL/min — AB (ref >60)
GLUCOSE: 127 mg/dL — AB (ref 65–99)
Potassium: 4.4 mmol/L (ref 3.5–5.1)
SODIUM: 142 mmol/L (ref 135–145)

## 2017-05-30 LAB — CBC
HCT: 31.3 % — ABNORMAL LOW (ref 35.0–47.0)
HEMOGLOBIN: 10.2 g/dL — AB (ref 12.0–16.0)
MCH: 28.8 pg (ref 26.0–34.0)
MCHC: 32.5 g/dL (ref 32.0–36.0)
MCV: 88.5 fL (ref 80.0–100.0)
Platelets: 193 10*3/uL (ref 150–440)
RBC: 3.53 MIL/uL — ABNORMAL LOW (ref 3.80–5.20)
RDW: 14.9 % — ABNORMAL HIGH (ref 11.5–14.5)
WBC: 9.8 10*3/uL (ref 3.6–11.0)

## 2017-05-30 LAB — MAGNESIUM: MAGNESIUM: 2.6 mg/dL — AB (ref 1.7–2.4)

## 2017-05-30 LAB — GLUCOSE, CAPILLARY
Glucose-Capillary: 126 mg/dL — ABNORMAL HIGH (ref 65–99)
Glucose-Capillary: 122 mg/dL — ABNORMAL HIGH (ref 65–99)
Glucose-Capillary: 142 mg/dL — ABNORMAL HIGH (ref 65–99)
Glucose-Capillary: 152 mg/dL — ABNORMAL HIGH (ref 65–99)
Glucose-Capillary: 216 mg/dL — ABNORMAL HIGH (ref 65–99)
Glucose-Capillary: 166 mg/dL — ABNORMAL HIGH (ref 65–99)

## 2017-05-30 LAB — PHOSPHORUS: PHOSPHORUS: 3.9 mg/dL (ref 2.5–4.6)

## 2017-05-30 MED ORDER — SODIUM CHLORIDE 0.9% FLUSH: 3.0000 mL | INTRAVENOUS | Status: DC | PRN

## 2017-05-30 MED ORDER — SODIUM CHLORIDE 0.9% FLUSH
3.0000 mL | Freq: Two times a day (BID) | INTRAVENOUS | Status: DC
  Administered 2017-05-30 – 2017-06-01 (×5): 3 mL via INTRAVENOUS

## 2017-05-30 NOTE — Progress Notes (Signed)
Transferred from ICU via bed. Pulse 85% on 02 3l/[place] with 02 titrated to 4l.[place] with slow recovery to 92 %. [facility] becoming SOB on exertion upon return to bed with pulse ox 82 % with slow recovery. 02 adjusted to 5l/[place] with pulse ox 92 %. Dgt remains at bedside. TC with Cyndi LennertFreeda, RT and requested assessment of pt and recommendations.

## 2017-05-30 NOTE — Progress Notes (Signed)
[employer] -  [facility]   PATIENT NAME: Dana [name]    MR#:  [id number]  DATE OF BIRTH:  03/10/1933  SUBJECTIVE:   Patient here due to acute on chronic respiratory failure with hypoxemia/hypercapnia secondary to COPD. Much improved. Off BiPAP now. We'll transfer to floor later today. Respiratory status much improved since yesterday.  REVIEW OF SYSTEMS:    Review of Systems  Constitutional: Negative for chills and fever.  HENT: Negative for congestion and tinnitus.   Eyes: Negative for blurred vision and double vision.  Respiratory: Negative for cough, shortness of breath and wheezing.   Cardiovascular: Negative for chest pain, orthopnea and PND.  Gastrointestinal: Negative for abdominal pain, diarrhea, nausea and vomiting.  Genitourinary: Negative for dysuria and hematuria.  Neurological: Negative for dizziness, sensory change and focal weakness.  All other systems reviewed and are negative.   Nutrition: Heart healthy Tolerating Diet: Yes Tolerating PT: Await Eval.   DRUG ALLERGIES:  No Known Allergies  VITALS:  Blood pressure 106/79, pulse 90, temperature 99.5 F (37.5 C), resp. rate (!) 23, height 4\' 7"  (1.397 m), weight 70.7 kg (155 lb 13.8 oz), SpO2 98 %.  PHYSICAL EXAMINATION:   Physical Exam  GENERAL:  83 y.o.-year-old patient lying in bed with some tremor but in NAD.  EYES: Pupils equal, round, reactive to light and accommodation. No scleral icterus. Extraocular muscles intact.  HEENT: Head atraumatic, normocephalic. Oropharynx and nasopharynx clear.  NECK:  Supple, no jugular venous distention. No thyroid enlargement, no tenderness.  LUNGS: Normal breath sounds bilaterally, no wheezing, rales, rhonchi. No use of accessory muscles of respiration.  CARDIOVASCULAR: S1, S2 normal. No murmurs, rubs, or gallops.  ABDOMEN: Soft, nontender, nondistended. Bowel sounds present. No organomegaly or mass.  EXTREMITIES: No cyanosis, clubbing or  edema b/l.    NEUROLOGIC: Cranial nerves II through XII are intact. No focal Motor or sensory deficits b/l.  + Tremor PSYCHIATRIC: The patient is alert and oriented x 3.  SKIN: No obvious rash, lesion, or ulcer.    LABORATORY PANEL:   CBC Recent Labs  Lab 05/30/17 0532  WBC 9.8  HGB 10.2*  HCT 31.3*  PLT 193   ------------------------------------------------------------------------------------------------------------------  Chemistries  Recent Labs  Lab 05/28/17 0607  05/30/17 0532  NA 139   < > 142  K 3.8   < > 4.4  CL 91*   < > 88*  CO2 40*   < > 46*  GLUCOSE 111*   < > 127*  BUN 20   < > 29*  CREATININE 0.96   < > 1.09*  CALCIUM 8.8*   < > 9.2  MG  --   --  2.6*  AST 18  --   --   ALT 12*  --   --   ALKPHOS 77  --   --   BILITOT 0.7  --   --    < > = values in this interval not displayed.   ------------------------------------------------------------------------------------------------------------------  Cardiac Enzymes Recent Labs  Lab 05/26/17 1627  TROPONINI <0.03   ------------------------------------------------------------------------------------------------------------------  RADIOLOGY:  Dg Chest Port 1 View  Result Date: 05/29/2017 CLINICAL DATA:  Respiratory failure. EXAM: PORTABLE CHEST 1 VIEW COMPARISON:  05/28/2017. FINDINGS: Mediastinum hilar structures are normal. Cardiomegaly with bilateral from interstitial prominence and bilateral pleural effusions consistent CHF. No change from prior exam. IMPRESSION: Congestive heart failure pulmonary interstitial edema and bilateral pleural effusions. No significant change from prior exam. Electronically Signed   By: Maisie Fushomas  Register  On: 05/29/2017 09:26     ASSESSMENT AND PLAN:   83 year old female with past medical history of COPD, CHF, atrial fibrillation, hyperlipidemia, neuropathy who presented to the hospital due to shortness of breath.  1. Acute on chronic respiratory failure with  hypoxia/hypercapnia-secondary to combination of CHF and COPD. -Was on BiPAP but now weaned off of it. -Continue IV steroids, scheduled DuoNeb's, Pulmicort nebs. Improving.  2. COPD exacerbation-the cause of patient's worsening respiratory failure. Continue IV steroids and transitioned to oral prednisone and next day or 2. Continue scheduled DuoNeb's, Pulmicort nebs. Patient is on chronic oxygen at home.  3. CHF-acute on chronic diastolic dysfunction-continue Lasix and switched to oral now. Next-about 5 L negative since admission and improved.  4. History of paroxysmal atrial fibrillation-currently in sinus rhythm. Continue amiodarone.  5. Hyperlipidemia-continue atorvastatin.  6. GERD-continue Pepcid.  Transfer to floor, await PT eval.   All the records are reviewed and case discussed with Care Management/Social Worker. Management plans discussed with the patient, family and they are in agreement.  CODE STATUS: Full code  DVT Prophylaxis: Lovenox  TOTAL TIME TAKING CARE OF THIS PATIENT: 30 minutes.   POSSIBLE D/C IN 2-3 DAYS, DEPENDING ON CLINICAL CONDITION.   Houston SirenSAINANI,VIVEK J M.D on 05/30/2017 at 2:38 PM  Between 7am to 6pm - Pager - [phone]  After 6pm go to [email or web address] - Social research officer, governmentpassword EPAS [email or web address]  [facility] [place] [facility]  Office  [phone]  CC: Primary care physician; Barron AlvineGibson, David, MD

## 2017-05-30 NOTE — Progress Notes (Signed)
Pt sats 99% on 4lpm. Asked NP if we needed to put back on bipap and was told to hold off at this time if she is not in distress and she is stable. Bipap not put on tonight. No complications.

## 2017-05-30 NOTE — Progress Notes (Signed)
[facility] [place] Pulmonary and Critical Medicine Consultation    This note has been copied forward from my note placed on 05/29/2017 and has been reviewed and modified to reflect the events labs and information for the last 24 hours.  Date: 05/30/2017,   MRN# [id number] Dana [name] 03/05/1933 Code Status:     Code Status Orders  (From admission, onward)        Start     Ordered   05/26/17 2035  Full code  Continuous     05/26/17 2034    Code Status History    Date Active Date Inactive Code Status Order ID Comments User Context   03/24/2016 16:47 04/01/2016 20:42 Full Code [id number]  de Dios, Hilton CorkJose Angelo A, MD Inpatient     Hosp day:@LENGTHOFSTAYDAYS @ Referring MD: @ATDPROV @     PCP:      AdmissionWeight: 162 lb (73.5 kg)                 CurrentWeight: 155 lb 13.8 oz (70.7 kg) Dana [name] is a 83 y.o. old female      SUBJECTIVE:   No acute issues overnight. Was not placed on BiPAP last night as she was doing well and has remained off BiPAP for 24 hours.   States she feels better. Denies chest pain, SOB. Hemodynamically stable. Labs reviewed.  Creatinine marginally elevated. Patient is cumulatively 2.6 L negative in the last 24 hours.  Echocardiogram performed yesterday showed ejection fraction of 60-65%.  The echo does not mention evidence of diastolic heart failure  Echocardiogram performed yesterday showed ejection fraction of 60-65%.  MEDICATIONS      Current Medication:   Current Facility-Administered Medications:  .  acetaminophen (TYLENOL) tablet 650 mg, 650 mg, Oral, Q6H PRN **OR** acetaminophen (TYLENOL) suppository 650 mg, 650 mg, Rectal, Q6H PRN, Arnaldo Nataliamond, Michael S, MD .  albuterol (PROVENTIL) (2.5 MG/3ML) 0.083% nebulizer solution 2.5 mg, 2.5 mg, Nebulization, Q3H PRN, Merwyn KatosSimonds, David B, MD .  amiodarone (PACERONE) tablet 200 mg, 200 mg, Oral, Daily, Arnaldo Nataliamond, Michael S, MD, 200 mg at 05/30/17 0931 .  aspirin chewable tablet 81 mg, 81 mg, Oral,  Daily, Arnaldo Nataliamond, Michael S, MD, 81 mg at 05/30/17 0931 .  atorvastatin (LIPITOR) tablet 40 mg, 40 mg, Oral, QPM, Arnaldo Nataliamond, Michael S, MD, 40 mg at 05/29/17 1747 .  budesonide (PULMICORT) nebulizer solution 0.5 mg, 0.5 mg, Nebulization, BID, Arnaldo Nataliamond, Michael S, MD, 0.5 mg at 05/30/17 0751 .  Chlorhexidine Gluconate Cloth 2 % PADS 6 each, 6 each, Topical, Q0600, Varughese, Bincy S, NP, 6 each at 05/30/17 0603 .  docusate sodium (COLACE) capsule 100 mg, 100 mg, Oral, BID, Arnaldo Nataliamond, Michael S, MD, 100 mg at 05/30/17 0931 .  enoxaparin (LOVENOX) injection 40 mg, 40 mg, Subcutaneous, Q24H, Arnaldo Nataliamond, Michael S, MD, 40 mg at 05/29/17 2142 .  famotidine (PEPCID) tablet 20 mg, 20 mg, Oral, QHS, Arnaldo Nataliamond, Michael S, MD, 20 mg at 05/29/17 2116 .  feeding supplement (ENSURE ENLIVE) (ENSURE ENLIVE) liquid 237 mL, 237 mL, Oral, BID BM, Arnaldo Nataliamond, Michael S, MD, 237 mL at 05/30/17 0932 .  furosemide (LASIX) tablet 40 mg, 40 mg, Oral, Daily, Cristy HiltsKhan, Annielee Jemmott, MD, 40 mg at 05/30/17 0931 .  insulin aspart (novoLOG) injection 0-9 Units, 0-9 Units, Subcutaneous, Q4H, Cristy HiltsKhan, Chanel Mcadams, MD, 1 Units at 05/30/17 0830 .  ipratropium-albuterol (DUONEB) 0.5-2.5 (3) MG/3ML nebulizer solution 3 mL, 3 mL, Nebulization, Q6H, Arnaldo Nataliamond, Michael S, MD, 3 mL at 05/30/17 0751 .  MEDLINE mouth rinse, 15 mL, Mouth Rinse,  BID, Arnaldo Nataliamond, Michael S, MD, 15 mL at 05/30/17 0932 .  methylPREDNISolone sodium succinate (SOLU-MEDROL) 40 mg/mL injection 40 mg, 40 mg, Intravenous, Q8H, Cristy HiltsKhan, Sair Faulcon, MD, 40 mg at 05/30/17 0432 .  morphine 2 MG/ML injection 2-4 mg, 2-4 mg, Intravenous, Q3H PRN, Cristy HiltsKhan, Britanie Harshman, MD .  mupirocin ointment (BACTROBAN) 2 % 1 application, 1 application, Nasal, BID, Varughese, Bincy S, NP, 1 application at 05/30/17 0932 .  [DISCONTINUED] ondansetron (ZOFRAN) tablet 4 mg, 4 mg, Oral, Q6H PRN **OR** ondansetron (ZOFRAN) injection 4 mg, 4 mg, Intravenous, Q6H PRN, Arnaldo Nataliamond, Michael S, MD .  saccharomyces boulardii (FLORASTOR) capsule 250 mg, 250  mg, Oral, BID, Arnaldo Nataliamond, Michael S, MD, 250 mg at 05/29/17 2116    VS: BP (!) 138/91   Pulse 96   Temp 99.5 F (37.5 C)   Resp (!) 31   Ht 4\' 7"  (1.397 m)   Wt 155 lb 13.8 oz (70.7 kg)   SpO2 93%   BMI 36.23 kg/m      PHYSICAL EXAM   Physical Exam  Awake, alert. Hard of hearing. No distress. Follows commands. Sclera: Anicteric CVS: S1, S2 0 Chest: Examination much better compared to yesterday.  Still has occasional wheezes but remarkably improved compared to yesterday.  No rhonchi Abd: soft, benign. Non tender LE: bilateral erythema with edema improved compared to yesterday.    LABS    Recent Labs    05/28/17 0607 05/29/17 0453 05/30/17 0532  HGB 9.6* 9.8* 10.2*  HCT 29.5* 30.4* 31.3*  MCV 89.5 90.1 88.5  WBC 8.5 7.7 9.8  BUN 20 20 29*  CREATININE 0.96 0.82 1.09*  GLUCOSE 111* 95 127*  CALCIUM 8.8* 8.9 9.2  ,  Creatinine 1.09.  BUN 29.  Rest of the electrolytes are normal  No results for input(s): PH in the last 72 hours.  Invalid input(s): PCO2, PO2, BASEEXCESS, BASEDEFICITE, TFT  Creatinine 1.09, BUN 29.  CULTURE RESULTS   Recent Results (from the past 240 hour(s))  Blood culture (routine x 2)     Status: None (Preliminary result)   Collection Time: 05/26/17  4:20 PM  Result Value Ref Range Status   Specimen Description BLOOD BLOOD RIGHT WRIST  Final   Special Requests   Final    BOTTLES DRAWN AEROBIC AND ANAEROBIC Blood Culture adequate volume   Culture NO GROWTH 4 DAYS  Final   Report Status PENDING  Incomplete  Blood culture (routine x 2)     Status: None (Preliminary result)   Collection Time: 05/26/17  4:28 PM  Result Value Ref Range Status   Specimen Description BLOOD BLOOD RIGHT ARM  Final   Special Requests   Final    BOTTLES DRAWN AEROBIC AND ANAEROBIC Blood Culture results may not be optimal due to an excessive volume of blood received in culture bottles   Culture NO GROWTH 4 DAYS  Final   Report Status PENDING  Incomplete  MRSA  PCR Screening     Status: Abnormal   Collection Time: 05/26/17  8:31 PM  Result Value Ref Range Status   MRSA by PCR POSITIVE (A) NEGATIVE Final    Comment:        The GeneXpert MRSA Assay (FDA approved for NASAL specimens only), is one component of a comprehensive MRSA colonization surveillance program. It is not intended to diagnose MRSA infection nor to guide or monitor treatment for MRSA infections. RESULT CALLED TO, READ BACK BY AND VERIFIED WITH: TESS THOMAS ON 05/26/17 AT 2241 BY JAG  IMAGING    No results found.    ASSESSMENT/PLAN    83 y/o lady with COPD, chronic hypoxemic resp failure - on 3L home O2, histroic diastolic CHF, Hx of tobacco abuse admitted with:  Acute on chronic hypoxemic respiratory failure Acute hypercapnic respiratory failure COPD with exacerbation Hx of tobacco abuse Chronic bil LE venous stasis  Troponin, procalcitonin, Strep pneumo, legionella, influenza and blood cultures negative. LE duplex negative. Initially treated with vanc and zosyn in the ED. Transitioned to rocephin and azithromycin in the ICU that has since been stopped.    Overall much improved and has remained off BiPAP for 24 hours. Respiratory function has improved as well.  Plan: Continue scheduled duonebs and BID pulmicort. PRN albuterol Continue methylprednisone 40 mg Q8h today and can start weaning tomorrow to transition the steroid off over the next 7 days. Continue home Lasix but given her slight bump in creatinine, I will not give her any additional as needed diuretics.  Monitor daily renal functions and if continue to worsen, consider stopping diuretics. Echocardiogram shows ejection fraction of 60-65%.  Does not mention any evidence of diastolic heart failure.  ABG 05/29/17 showed compensated hypercapnea with PCO2 of 83. Review of old ABGs from 2017 show her PCO2 to be in the 40s - 60s.  Observe patient clinically for now and repeat an ABG if  clinically indicated.   Lovenox for DVT prophylaxis Pepcid for SUP. Insulin sliding scale now that the patient is on IV steroids.   Code: Full.   Patient is stable to be transferred out of the ICU to a telemetry bed.  Cristy Hilts, M.D.  Pulmonary & Critical Care Medicine

## 2017-05-31 LAB — CULTURE, BLOOD (ROUTINE X 2)
CULTURE: NO GROWTH
Culture: NO GROWTH
SPECIAL REQUESTS: ADEQUATE

## 2017-05-31 LAB — GLUCOSE, CAPILLARY
Glucose-Capillary: 122 mg/dL — ABNORMAL HIGH (ref 65–99)
Glucose-Capillary: 122 mg/dL — ABNORMAL HIGH (ref 65–99)
Glucose-Capillary: 133 mg/dL — ABNORMAL HIGH (ref 65–99)
Glucose-Capillary: 139 mg/dL — ABNORMAL HIGH (ref 65–99)
Glucose-Capillary: 143 mg/dL — ABNORMAL HIGH (ref 65–99)

## 2017-05-31 LAB — CBC
HCT: 32.9 % — ABNORMAL LOW (ref 35.0–47.0)
Hemoglobin: 10.7 g/dL — ABNORMAL LOW (ref 12.0–16.0)
MCH: 29.1 pg (ref 26.0–34.0)
MCHC: 32.7 g/dL (ref 32.0–36.0)
MCV: 89 fL (ref 80.0–100.0)
PLATELETS: 207 10*3/uL (ref 150–440)
RBC: 3.69 MIL/uL — ABNORMAL LOW (ref 3.80–5.20)
RDW: 14.9 % — AB (ref 11.5–14.5)
WBC: 10.6 10*3/uL (ref 3.6–11.0)

## 2017-05-31 LAB — BASIC METABOLIC PANEL
Anion gap: 6 (ref 5–15)
BUN: 35 mg/dL — AB (ref 6–20)
CHLORIDE: 90 mmol/L — AB (ref 101–111)
CO2: 44 mmol/L — AB (ref 22–32)
CREATININE: 1.04 mg/dL — AB (ref 0.44–1.00)
Calcium: 9.1 mg/dL (ref 8.9–10.3)
GFR calc Af Amer: 56 mL/min — ABNORMAL LOW (ref >60)
GFR calc non Af Amer: 49 mL/min — ABNORMAL LOW (ref >60)
Glucose, Bld: 148 mg/dL — ABNORMAL HIGH (ref 65–99)
Potassium: 4.4 mmol/L (ref 3.5–5.1)
SODIUM: 140 mmol/L (ref 135–145)

## 2017-05-31 MED ORDER — METHYLPREDNISOLONE SODIUM SUCC 40 MG IJ SOLR
40.0000 mg | Freq: Every day | INTRAMUSCULAR | Status: DC
  Administered 2017-06-01: 40 mg via INTRAVENOUS
  Filled 2017-05-31: qty 1

## 2017-05-31 MED ORDER — DILTIAZEM HCL 25 MG/5ML IV SOLN
5.0000 mg | Freq: Once | INTRAVENOUS | Status: AC
  Administered 2017-05-31: 06:00:00 5 mg via INTRAVENOUS
  Filled 2017-05-31: qty 5

## 2017-05-31 NOTE — Progress Notes (Signed)
Pt converted to A fib  With HR >130 this am about 0155. MD paged and order was given to give 200mg  daily dose amidarone early. Dose given and pt HR stay elevated. MD order 5mg  IV push cardizem. Dose given and pt HR remains elevated. However, pt BP dropped to 80s. MD notified and pt was transferred to 2A, Rm 236. Took pt to new room. No signs of distress noted at transfer.

## 2017-05-31 NOTE — Progress Notes (Signed)
[employer] [facility] [place] [facility]   PATIENT NAME: Dana [name]    MR#:  [id number]  DATE OF BIRTH:  04/22/1933  SUBJECTIVE:   Patient here due to acute on chronic respiratory failure with hypoxemia/hypercapnia secondary to COPD. patient went into A. fib with RVR overnight and was transferred to telemetry and was going to be started on amiodarone drip but has converted to sinus rhythm already. Asymptomatic now. Shortness of breath is improved since admission.  REVIEW OF SYSTEMS:    Review of Systems  Constitutional: Negative for chills and fever.  HENT: Negative for congestion and tinnitus.   Eyes: Negative for blurred vision and double vision.  Respiratory: Negative for cough, shortness of breath and wheezing.   Cardiovascular: Negative for chest pain, orthopnea and PND.  Gastrointestinal: Negative for abdominal pain, diarrhea, nausea and vomiting.  Genitourinary: Negative for dysuria and hematuria.  Neurological: Negative for dizziness, sensory change and focal weakness.  All other systems reviewed and are negative.   Nutrition: Heart healthy Tolerating Diet: Yes Tolerating PT: Await Eval.   DRUG ALLERGIES:  No Known Allergies  VITALS:  Blood pressure 114/63, pulse 87, temperature 98.3 F (36.8 C), temperature source Oral, resp. rate (!) 22, height 4\' 7"  (1.397 m), weight 65.3 kg (143 lb 14.4 oz), SpO2 93 %.  PHYSICAL EXAMINATION:   Physical Exam  GENERAL:  83 y.o.-year-old patient lying in bed with some tremor but in NAD.  EYES: Pupils equal, round, reactive to light and accommodation. No scleral icterus. Extraocular muscles intact.  HEENT: Head atraumatic, normocephalic. Oropharynx and nasopharynx clear.  NECK:  Supple, no jugular venous distention. No thyroid enlargement, no tenderness.  LUNGS: Normal breath sounds bilaterally, no wheezing, rales, rhonchi. No use of accessory muscles of respiration.  CARDIOVASCULAR: S1, S2 normal. No murmurs, rubs,  or gallops.  ABDOMEN: Soft, nontender, nondistended. Bowel sounds present. No organomegaly or mass.  EXTREMITIES: No cyanosis, clubbing or edema b/l.    NEUROLOGIC: Cranial nerves II through XII are intact. No focal Motor or sensory deficits b/l.  + Tremor PSYCHIATRIC: The patient is alert and oriented x 3.  SKIN: No obvious rash, lesion, or ulcer.    LABORATORY PANEL:   CBC Recent Labs  Lab 05/31/17 0705  WBC 10.6  HGB 10.7*  HCT 32.9*  PLT 207   ------------------------------------------------------------------------------------------------------------------  Chemistries  Recent Labs  Lab 05/28/17 0607  05/30/17 0532 05/31/17 0705  NA 139   < > 142 140  K 3.8   < > 4.4 4.4  CL 91*   < > 88* 90*  CO2 40*   < > 46* 44*  GLUCOSE 111*   < > 127* 148*  BUN 20   < > 29* 35*  CREATININE 0.96   < > 1.09* 1.04*  CALCIUM 8.8*   < > 9.2 9.1  MG  --   --  2.6*  --   AST 18  --   --   --   ALT 12*  --   --   --   ALKPHOS 77  --   --   --   BILITOT 0.7  --   --   --    < > = values in this interval not displayed.   ------------------------------------------------------------------------------------------------------------------  Cardiac Enzymes Recent Labs  Lab 05/26/17 1627  TROPONINI <0.03   ------------------------------------------------------------------------------------------------------------------  RADIOLOGY:  No results found.   ASSESSMENT AND PLAN:   83 year old female with past medical history of COPD, CHF, atrial fibrillation,  hyperlipidemia, neuropathy who presented to the hospital due to shortness of breath.  1. Acute on chronic respiratory failure with hypoxia/hypercapnia-secondary to combination of CHF and COPD. -Was on BiPAP but now weaned off of it. -Continue IV steroids but will taper further, cont. scheduled DuoNeb's, Pulmicort nebs. Improving.  2. COPD exacerbation-the cause of patient's worsening respiratory failure. Continue IV steroids and  transitioned to oral prednisone tomorrow. Continue scheduled DuoNeb's, Pulmicort nebs. Patient is on chronic oxygen at home.  3. CHF-acute on chronic diastolic dysfunction-continue Lasix and switched to oral now. -about 5 L negative since admission and improved.  4. History of paroxysmal atrial fibrillation- pt. Went into a. Fib w/ RVR overnight and was going to be started on amiodarone drip but has converted to a normal sinus rhythm already.  - Continue oral amiodarone.  5. Hyperlipidemia-continue atorvastatin.  6. GERD-continue Pepcid.  Will get PT eval.    All the records are reviewed and case discussed with Care Management/Social Worker. Management plans discussed with the patient, family and they are in agreement.  CODE STATUS: Full code  DVT Prophylaxis: Lovenox  TOTAL TIME TAKING CARE OF THIS PATIENT: 30 minutes.   POSSIBLE D/C IN 2-3 DAYS, DEPENDING ON CLINICAL CONDITION.   [place] SirenSAINANI,VIVEK J M.D on 05/31/2017 at 1:52 PM  Between 7am to 6pm - Pager - [phone]  After 6pm go to [email or web address] - Social research officer, governmentpassword EPAS [facility]  [facility]  Office  [phone]  CC: Primary care physician; Barron AlvineGibson, David, MD

## 2017-05-31 NOTE — Progress Notes (Signed)
Pt accepted in transfer from 1-c. Pt is a/o in no particular disctress. Placed on tele and found to be in nsr in 80's. Amiodarone gtt not started. Dr. Cherlynn Kaisersainani notified.

## 2017-05-31 NOTE — Progress Notes (Signed)
Pt transferred to 2A to Rm 236 telemetry via bed on 02 @ 6l/[place] accompanied by dgt and other RN, Rosanne SackKasey with pt tolerating well.

## 2017-06-01 LAB — CBC
HCT: 33.1 % — ABNORMAL LOW (ref 35.0–47.0)
HEMOGLOBIN: 10.6 g/dL — AB (ref 12.0–16.0)
MCH: 28.6 pg (ref 26.0–34.0)
MCHC: 31.9 g/dL — ABNORMAL LOW (ref 32.0–36.0)
MCV: 89.7 fL (ref 80.0–100.0)
PLATELETS: 224 10*3/uL (ref 150–440)
RBC: 3.69 MIL/uL — AB (ref 3.80–5.20)
RDW: 14.8 % — ABNORMAL HIGH (ref 11.5–14.5)
WBC: 10.5 10*3/uL (ref 3.6–11.0)

## 2017-06-01 LAB — GLUCOSE, CAPILLARY
Glucose-Capillary: 123 mg/dL — ABNORMAL HIGH (ref 65–99)
Glucose-Capillary: 131 mg/dL — ABNORMAL HIGH (ref 65–99)
Glucose-Capillary: 85 mg/dL (ref 65–99)
Glucose-Capillary: 94 mg/dL (ref 65–99)

## 2017-06-01 LAB — BASIC METABOLIC PANEL
ANION GAP: 10 (ref 5–15)
BUN: 45 mg/dL — ABNORMAL HIGH (ref 6–20)
CO2: 43 mmol/L — AB (ref 22–32)
CREATININE: 1.23 mg/dL — AB (ref 0.44–1.00)
Calcium: 8.9 mg/dL (ref 8.9–10.3)
Chloride: 88 mmol/L — ABNORMAL LOW (ref 101–111)
GFR calc non Af Amer: 40 mL/min — ABNORMAL LOW (ref >60)
GFR, EST AFRICAN AMERICAN: 46 mL/min — AB (ref >60)
Glucose, Bld: 96 mg/dL (ref 65–99)
Potassium: 3.6 mmol/L (ref 3.5–5.1)
SODIUM: 141 mmol/L (ref 135–145)

## 2017-06-01 MED ORDER — INSULIN ASPART 100 UNIT/ML ~~LOC~~ SOLN: 0.0000 [IU] | Freq: Three times a day (TID) | SUBCUTANEOUS | Status: DC

## 2017-06-01 MED ORDER — ENOXAPARIN SODIUM 30 MG/0.3ML ~~LOC~~ SOLN: 30.0000 mg | SUBCUTANEOUS | Status: DC

## 2017-06-01 MED ORDER — AMIODARONE HCL 200 MG PO TABS: 200.0000 mg | ORAL_TABLET | Freq: Every day | ORAL | 1 refills | Status: AC

## 2017-06-01 MED ORDER — PREDNISONE 10 MG PO TABS: ORAL_TABLET | ORAL | 0 refills | Status: DC

## 2017-06-01 NOTE — Discharge Summary (Signed)
[employer] - [place] at [facility]   PATIENT NAME: Dana [name]    MR#:  [id number]  DATE OF BIRTH:  01/13/1933  DATE OF ADMISSION:  05/26/2017 ADMITTING PHYSICIAN: Arnaldo NatalMichael S Diamond, MD  DATE OF DISCHARGE: 06/01/2017  PRIMARY CARE PHYSICIAN: Barron AlvineGibson, David, MD    ADMISSION DIAGNOSIS:  Hypothermia, initial encounter [T68.XXXA] Acute respiratory failure with hypoxia and hypercarbia (HCC) [J96.01, J96.02] Cellulitis of lower extremity, unspecified laterality [L03.119] Acute on chronic congestive heart failure, unspecified heart failure type (HCC) [I50.9]  DISCHARGE DIAGNOSIS:  Active Problems:   Sepsis (HCC)   Acute respiratory failure with hypoxia and hypercarbia (HCC)   Acute on chronic congestive heart failure (HCC)   DNR (do not resuscitate) discussion   Palliative care by specialist   COPD exacerbation (HCC)   SECONDARY DIAGNOSIS:   Past Medical History:  Diagnosis Date  . CHF (congestive heart failure) (HCC)   . COPD (chronic obstructive pulmonary disease) [facility](HCC)     HOSPITAL COURSE:   83 year old female with past medical history of COPD, CHF, atrial fibrillation, hyperlipidemia, neuropathy who presented to the hospital due to shortness of breath.  1. Acute on chronic respiratory failure with hypoxia/hypercapnia-secondary to combination of CHF and COPD. -Initially patient admitted to the ICU and was placed on BiPAP. She received some IV Lasix and also started on treatment for COPD with IV steroids, scheduled DuoNeb's and Pulmicort nebs. -She has clinically improved and is now off BiPAP. She is being discharged home in a long prednisone taper. She is on chronic oxygen at home and was titrated up to 4-5 L on ambulation.  2. COPD exacerbation-the cause of patient's worsening respiratory failure.  -Patient was treated with IV steroids, scheduled DuoNeb's and Pulmicort nebs. She has clinically improved. Now she is being discharged on oral prednisone  taper. She will continue albuterol inhaler, DuoNeb's as needed. She will follow-up with a pulmonologist as an outpatient. She will continue her oxygen at 3 L at baseline and increase it to 4-5 L upon ambulation.  3. CHF-acute on chronic diastolic dysfunction-patient was diuresed with IV Lasix and responded well to it. She is about 5 L negative since admission. She has clinically improved. -She will continue her home dose Lasix at 60 mg daily as mentioned below.  4. History of paroxysmal atrial fibrillation- pt. Went into a. Fib w/ RVR while in the hospital but converted to normal sinus rhythm shortly thereafter. She apparently had stopped taking her amiodarone at home which has not been resumed upon discharge.  5. Hyperlipidemia- She will continue atorvastatin.  She was seen by physical therapy and recommended home health services but the patient refused that upon discharge.  DISCHARGE CONDITIONS:   Stable  CONSULTS OBTAINED:    DRUG ALLERGIES:  No Known Allergies  DISCHARGE MEDICATIONS:   Allergies as of 06/01/2017   No Known Allergies     Medication List    STOP taking these medications   famotidine 20 MG tablet Commonly known as:  PEPCID   saccharomyces boulardii 250 MG capsule Commonly known as:  FLORASTOR     TAKE these medications   acetaminophen 325 MG tablet Commonly known as:  TYLENOL Take 325-650 mg by mouth every 6 (six) hours as needed for headache.   albuterol 108 (90 Base) MCG/ACT inhaler Commonly known as:  PROVENTIL HFA;VENTOLIN HFA Inhale 2 puffs into the lungs every 6 (six) hours as needed for wheezing.   amiodarone 200 MG tablet Commonly known as:  PACERONE Take 1 tablet (  200 mg total) by mouth daily.   aspirin 81 MG chewable tablet Chew 1 tablet (81 mg total) by mouth daily.   atorvastatin 40 MG tablet Commonly known as:  LIPITOR Take 40 mg by mouth every evening.   budesonide 0.5 MG/2ML nebulizer solution Commonly known as:   PULMICORT Take 2 mLs (0.5 mg total) by nebulization 2 (two) times daily.   ergocalciferol 50000 units capsule Commonly known as:  VITAMIN D2 Take 50,000 Units by mouth once a week.   feeding supplement (ENSURE ENLIVE) Liqd Take 237 mLs by mouth 2 (two) times daily between meals.   furosemide 20 MG tablet Commonly known as:  LASIX Take 60 mg by mouth.   gabapentin 100 MG capsule Commonly known as:  NEURONTIN Take 200 mg by mouth 3 (three) times daily.   ipratropium-albuterol 0.5-2.5 (3) MG/3ML Soln Commonly known as:  DUONEB Take 3 mLs by nebulization every 6 (six) hours.   nicotine 21 mg/24hr patch Commonly known as:  NICODERM CQ - dosed in mg/24 hours Place 1 patch (21 mg total) onto the skin daily.   predniSONE 10 MG tablet Commonly known as:  DELTASONE Label  & dispense according to the schedule below. 5 Pills PO for 2 days then, 4 Pills PO for 2 days, 3 Pills PO for 2 days, 2 Pills PO for 2 days, 1 Pill PO for 2 days then STOP. What changed:    medication strength  additional instructions   senna-docusate 8.6-50 MG tablet Commonly known as:  Senokot-S Take 1 tablet by mouth 2 (two) times daily.         DISCHARGE INSTRUCTIONS:   DIET:  Cardiac diet  DISCHARGE CONDITION:  Stable  ACTIVITY:  Activity as tolerated  OXYGEN:  Home Oxygen: Yes.     Oxygen Delivery: 3 liters/min via Patient connected to nasal cannula oxygen  DISCHARGE LOCATION:  Home   If you experience worsening of your admission symptoms, develop shortness of breath, life threatening emergency, suicidal or homicidal thoughts you must seek medical attention immediately by calling 911 or calling your MD immediately  if symptoms less severe.  You Must read complete instructions/literature along with all the possible adverse reactions/side effects for all the Medicines you take and that have been prescribed to you. Take any new Medicines after you have completely understood and accpet all  the possible adverse reactions/side effects.   Please note  You were cared for by a hospitalist during your hospital stay. If you have any questions about your discharge medications or the care you received while you were in the hospital after you are discharged, you can call the unit and asked to speak with the hospitalist on call if the hospitalist that took care of you is not available. Once you are discharged, your primary care physician will handle any further medical issues. Please note that NO REFILLS for any discharge medications will be authorized once you are discharged, as it is imperative that you return to your primary care physician (or establish a relationship with a primary care physician if you do not have one) for your aftercare needs so that they can reassess your need for medications and monitor your lab values.     Today   Shortness of breath improved since admission. Daughter is at bedside. No other acute events overnight. Seen by physical therapy and recommended home health services which the patient refused.  VITAL SIGNS:  Blood pressure (!) 107/54, pulse 75, temperature 98 F (36.7 C), temperature source  Oral, resp. rate 14, height 4\' 7"  (1.397 m), weight 65.3 kg (143 lb 14.4 oz), SpO2 97 %.  I/O:    Intake/Output Summary (Last 24 hours) at 06/01/2017 1507 Last data filed at 06/01/2017 1330 Gross per 24 hour  Intake 363 ml  Output -  Net 363 ml    PHYSICAL EXAMINATION:  GENERAL:  83 y.o.-year-old patient sitting up in chair in no acute distress.  EYES: Pupils equal, round, reactive to light and accommodation. No scleral icterus. Extraocular muscles intact.  HEENT: Head atraumatic, normocephalic. Oropharynx and nasopharynx clear.  NECK:  Supple, no jugular venous distention. No thyroid enlargement, no tenderness.  LUNGS: Prolonged insp. & exp. phase, no wheezing, rales, rhonchi. No use of accessory muscles of respiration.  CARDIOVASCULAR: S1, S2 normal. No  murmurs, rubs, or gallops.  ABDOMEN: Soft, non-tender, non-distended. Bowel sounds present. No organomegaly or mass.  EXTREMITIES: No pedal edema, cyanosis, or clubbing.  NEUROLOGIC: Cranial nerves II through XII are intact. No focal motor or sensory defecits b/l. Globally weak.  PSYCHIATRIC: The patient is alert and oriented x 3.   SKIN: No obvious rash, lesion, or ulcer.   DATA REVIEW:   CBC Recent Labs  Lab 06/01/17 0425  WBC 10.5  HGB 10.6*  HCT 33.1*  PLT 224    Chemistries  Recent Labs  Lab 05/28/17 0607  05/30/17 0532  06/01/17 0425  NA 139   < > 142   < > 141  K 3.8   < > 4.4   < > 3.6  CL 91*   < > 88*   < > 88*  CO2 40*   < > 46*   < > 43*  GLUCOSE 111*   < > 127*   < > 96  BUN 20   < > 29*   < > 45*  CREATININE 0.96   < > 1.09*   < > 1.23*  CALCIUM 8.8*   < > 9.2   < > 8.9  MG  --   --  2.6*  --   --   AST 18  --   --   --   --   ALT 12*  --   --   --   --   ALKPHOS 77  --   --   --   --   BILITOT 0.7  --   --   --   --    < > = values in this interval not displayed.    Cardiac Enzymes Recent Labs  Lab 05/26/17 1627  TROPONINI <0.03    Microbiology Results  Results for orders placed or performed during the hospital encounter of 05/26/17  Blood culture (routine x 2)     Status: None   Collection Time: 05/26/17  4:20 PM  Result Value Ref Range Status   Specimen Description BLOOD BLOOD RIGHT WRIST  Final   Special Requests   Final    BOTTLES DRAWN AEROBIC AND ANAEROBIC Blood Culture adequate volume   Culture NO GROWTH 5 DAYS  Final   Report Status 05/31/2017 FINAL  Final  Blood culture (routine x 2)     Status: None   Collection Time: 05/26/17  4:28 PM  Result Value Ref Range Status   Specimen Description BLOOD BLOOD RIGHT ARM  Final   Special Requests   Final    BOTTLES DRAWN AEROBIC AND ANAEROBIC Blood Culture results may not be optimal due to an excessive volume of blood received in culture  bottles   Culture NO GROWTH 5 DAYS  Final   Report  Status 05/31/2017 FINAL  Final  MRSA PCR Screening     Status: Abnormal   Collection Time: 05/26/17  8:31 PM  Result Value Ref Range Status   MRSA by PCR POSITIVE (A) NEGATIVE Final    Comment:        The GeneXpert MRSA Assay (FDA approved for NASAL specimens only), is one component of a comprehensive MRSA colonization surveillance program. It is not intended to diagnose MRSA infection nor to guide or monitor treatment for MRSA infections. RESULT CALLED TO, READ BACK BY AND VERIFIED WITH: TESS THOMAS ON 05/26/17 AT 2241 BY JAG     RADIOLOGY:  No results found.    Management plans discussed with the patient, family and they are in agreement.  CODE STATUS:     Code Status Orders  (From admission, onward)        Start     Ordered   05/26/17 2035  Full code  Continuous     05/26/17 2034    Code Status History    Date Active Date Inactive Code Status Order ID Comments User Context   03/24/2016 16:47 04/01/2016 20:42 Full Code [id number]  de Dios, Hilton Cork, MD Inpatient      TOTAL TIME TAKING CARE OF THIS PATIENT: 40 minutes.    Houston Siren M.D on 06/01/2017 at 3:07 PM  Between 7am to 6pm - Pager - [phone]  After 6pm go to [email or web address] - Social research officer, government  [facility] [place] [facility]  Office  [phone]  CC: Primary care physician; Barron Alvine, MD

## 2017-06-01 NOTE — Clinical Social Work Note (Signed)
CSW received referral for SNF.  Case discussed with case manager and plan is to discharge home with home health.  CSW to sign off please re-consult if social work needs arise.  Toma Arts R. Helia Haese, MSW, LCSWA [phone]  

## 2017-06-01 NOTE — Progress Notes (Signed)
Patient ID: Dana [name], female   DOB: 09/30/1932, 83 y.o.   MRN: [id number]  This NP visited patient at the bedside as a follow up to previous  GOCs meeting, patient is OOB in chair, daughter at bedside.  Continued discussion regarding diagnosis, prognosis, trajectory of ES-COPD.  Questions and concerns addressed     Declined hospice benefit at this time.   Is open to all offered and available medical interventions to prolong life.  Discussed with patient the importance of continued conversation with family and their  medical providers regarding overall plan of care and treatment options,  ensuring decisions are within the context of the patients values and GOCs.  Time in  0930         Time out    0945  Total time spoent on the unit was 15 minutes  Greater than 50% of the time was spent in counseling and coordination of care  Lorinda CreedMary Eris Hannan NP  Palliative Medicine Team Team Phone # [phone] Pager [phone]

## 2017-06-01 NOTE — Plan of Care (Signed)
  Progressing Education: Knowledge of General Education information will improve 06/01/2017 0842 - Progressing by Donald ProseBerry, Amayrany Cafaro L, RN Clinical Measurements: Diagnostic test results will improve 06/01/2017 0842 - Progressing by Donald ProseBerry, Tuere Nwosu L, RN Cardiac: Ability to achieve and maintain adequate cardiopulmonary perfusion will improve 06/01/2017 0842 - Progressing by Donald ProseBerry, Emir Nack L, RN Note No further arrhythmias noted   Not Progressing Clinical Measurements: Respiratory complications will improve 06/01/2017 0842 - Not Progressing by Donald ProseBerry, Darek Eifler L, RN Activity: Risk for activity intolerance will decrease 06/01/2017 0842 - Not Progressing by Donald ProseBerry, Shirah Roseman L, RN Note PT eval pending Activity: Ability to implement measures to reduce episodes of fatigue will improve 06/01/2017 0842 - Not Progressing by Donald ProseBerry, Stephanieann Popescu L, RN Note Remains on Steroids Respiratory: Ability to maintain a clear airway will improve 06/01/2017 0842 - Not Progressing by Donald ProseBerry, Talani Brazee L, RN Note Continues to have wheezes, SOB on exertion   Not Applicable Clinical Measurements: Will remain free from infection 06/01/2017 [id number] - Not Applicable by Donald ProseBerry, Cass Vandermeulen L, RN   Not Applicable Clinical Measurements: Will remain free from infection 06/01/2017 [id number] - Not Applicable by Donald ProseBerry, Mercedees Convery L, RN

## 2017-06-01 NOTE — Progress Notes (Signed)
Pharmacist-Provider Communication:  Order for enoxaparin changed to 30 mg subcutaneously daily per protocol for CrCl < 30 mL/min.  Cindi CarbonMary M Sophiarose Eades, PharmD, BCPS 06/01/17 10:12 AM

## 2017-06-01 NOTE — Progress Notes (Signed)
Pt discharged to home via wc.  Instructions and rx given to pt.  Questions answered.  No distress.  

## 2017-06-01 NOTE — Care Management (Signed)
Spoke with patient and her daughter Liborio Nixonjanice.  Patient for discharge home today and declining any home health services.  Instructed Liborio NixonJanice that if changes mind to contact patient's PCP.

## 2017-06-01 NOTE — Care Management Important Message (Signed)
Important Message  Patient Details  Name: Dana [name] MRN: [id number] Date of Birth: 07/05/1932   Medicare Important Message Given:  Yes Signed IM notice given   Eber HongGreene, Celestine Prim R, RN 06/01/2017, 11:13 AM

## 2017-06-01 NOTE — Evaluation (Signed)
Physical Therapy Evaluation Patient Details Name: Dana [name] MRN: [id number] DOB: 07/03/1932 Today's Date: 06/01/2017   History of Present Illness  Pt admitted for complaints of SOB symptoms. Pt now with acute on chronic respiratory failure secondary to COPD. History includes COPD, CHF, and Afib. Stay complicated by admission to CCU secondary to SOB, on bipap, now on 5L of [place].  Clinical Impression  Pt is a pleasant 83 year old female who was admitted for complaints of SOB symptoms. Pt performs bed mobility independently and transfers/ambulation with supervision and RW. Pt does not need RW for mobility, however trialed for energy conservation. Did not appear to improve sats as they quickly drop to 73% with limited exertion. All mobility performed on 5-6L of O2. Strength/balance WNL. Pt demonstrates deficits with endurance. Pt needs heavy cues for pursed lip breathing. Recommended to only ambulate short distances and use WC for longer distances. Family agreed. Would benefit from skilled PT to address above deficits and promote optimal return to PLOF. Recommend transition to HHPT upon discharge from acute hospitalization.       Follow Up Recommendations Home health PT(how pt and family refusing at this time)    Equipment Recommendations  (pulse ox)    Recommendations for Other Services       Precautions / Restrictions Precautions Precautions: Fall Restrictions Weight Bearing Restrictions: No      Mobility  Bed Mobility Overal bed mobility: Independent             General bed mobility comments: safe technique performed. Able to sit at EOB with upright posture. With limited exertion, O2 sats decrease to 73%. Educated on pursed lip breathing and taking rest breaks prior to performing more mobility.  Transfers Overall transfer level: Needs assistance Equipment used: Rolling walker (2 wheeled) Transfers: Sit to/from Stand Sit to Stand: Supervision         General  transfer comment: safe technique. Used RW for energy conservation, but does not need for mobility purpose. Upright posture noted  Ambulation/Gait Ambulation/Gait assistance: Supervision Ambulation Distance (Feet): 15 Feet Assistive device: Rolling walker (2 wheeled) Gait Pattern/deviations: Step-through pattern     General Gait Details: ambulated with safe technique with 6L of O2 with sats decreasing to 73%. Cues for pursed lip breathing with O2 taking extended time to improve to 88%.  Stairs            Wheelchair Mobility    Modified Rankin (Stroke Patients Only)       Balance Overall balance assessment: Needs assistance Sitting-balance support: Feet supported Sitting balance-Leahy Scale: Good     Standing balance support: Bilateral upper extremity supported Standing balance-Leahy Scale: Good                               Pertinent Vitals/Pain Pain Assessment: No/denies pain    Home Living Family/patient expects to be discharged to:: Private residence Living Arrangements: Children Available Help at Discharge: Available 24 hours/day;Family Type of Home: House Home Access: Stairs to enter Entrance Stairs-Rails: Can reach both Entrance Stairs-Number of Steps: 7 Home Layout: One level Home Equipment: None Additional Comments: family is always with her and assist in daily needs    Prior Function Level of Independence: Independent         Comments: reports no falls. Does not use AD at baseline.     Hand Dominance        Extremity/Trunk Assessment   Upper Extremity Assessment  Upper Extremity Assessment: Overall WFL for tasks assessed    Lower Extremity Assessment Lower Extremity Assessment: Generalized weakness(B LE grossly 4/5)       Communication   Communication: No difficulties  Cognition Arousal/Alertness: Awake/alert Behavior During Therapy: WFL for tasks assessed/performed Overall Cognitive Status: Within Functional Limits for  tasks assessed                                        General Comments      Exercises Other Exercises Other Exercises: Seated ther-ex performed on B LE including SLRs, hip abd/add, and ankle pumps. All ther-ex performed x 10 reps with cues for breathing and breaks for complaints of SOB symptoms.   Assessment/Plan    PT Assessment Patient needs continued PT services  PT Problem List Cardiopulmonary status limiting activity;Decreased mobility       PT Treatment Interventions Gait training;Therapeutic exercise    PT Goals (Current goals can be found in the Care Plan section)  Acute Rehab PT Goals Patient Stated Goal: to go home PT Goal Formulation: With patient Time For Goal Achievement: 06/15/17 Potential to Achieve Goals: Good    Frequency Min 2X/week   Barriers to discharge        Co-evaluation               AM-PAC PT "6 Clicks" Daily Activity  Outcome Measure Difficulty turning over in bed (including adjusting bedclothes, sheets and blankets)?: None Difficulty moving from lying on back to sitting on the side of the bed? : None Difficulty sitting down on and standing up from a chair with arms (e.g., wheelchair, bedside commode, etc,.)?: None Help needed moving to and from a bed to chair (including a wheelchair)?: None Help needed walking in hospital room?: A Little Help needed climbing 3-5 steps with a railing? : A Little 6 Click Score: 22    End of Session Equipment Utilized During Treatment: Gait belt;Oxygen Activity Tolerance: Treatment limited secondary to medical complications (Comment) Patient left: in chair;with chair alarm set;with family/visitor present Nurse Communication: Mobility status PT Visit Diagnosis: Difficulty in walking, not elsewhere classified (R26.2)    Time: [id number] PT Time Calculation (min) (ACUTE ONLY): 28 min   Charges:   PT Evaluation $PT Eval Low Complexity: 1 Low PT Treatments $Therapeutic Exercise: 8-22  mins   PT G Codes:   PT G-Codes **NOT FOR INPATIENT CLASS** Functional Assessment Tool Used: AM-PAC 6 Clicks Basic Mobility Functional Limitation: Mobility: Walking and moving around Mobility: Walking and Moving Around Current Status ([id number](G8978): At least 20 percent but less than 40 percent impaired, limited or restricted Mobility: Walking and Moving Around Goal Status [phone](G8979): At least 1 percent but less than 20 percent impaired, limited or restricted    Elizabeth PalauStephanie Gerard Cantara, PT, DPT [phone]   Quinnlan Abruzzo 06/01/2017, 10:53 AM

## 2017-06-08 ENCOUNTER — Ambulatory Visit: Payer: Medicare Other | Admitting: Family

## 2017-06-19 ENCOUNTER — Ambulatory Visit: Payer: Medicare Other | Admitting: Family

## 2017-06-22 ENCOUNTER — Emergency Department: Payer: Medicare Other

## 2017-06-22 ENCOUNTER — Other Ambulatory Visit: Payer: Self-pay

## 2017-06-22 ENCOUNTER — Inpatient Hospital Stay
Admission: EM | Admit: 2017-06-22 | Discharge: 2017-07-31 | DRG: 207 | Disposition: E | Payer: Medicare Other | Attending: Internal Medicine | Admitting: Internal Medicine

## 2017-06-22 DIAGNOSIS — D696 Thrombocytopenia, unspecified: Secondary | ICD-10-CM | POA: Diagnosis present

## 2017-06-22 DIAGNOSIS — T380X5A Adverse effect of glucocorticoids and synthetic analogues, initial encounter: Secondary | ICD-10-CM | POA: Diagnosis present

## 2017-06-22 DIAGNOSIS — I272 Pulmonary hypertension, unspecified: Secondary | ICD-10-CM | POA: Diagnosis present

## 2017-06-22 DIAGNOSIS — J440 Chronic obstructive pulmonary disease with acute lower respiratory infection: Secondary | ICD-10-CM | POA: Diagnosis present

## 2017-06-22 DIAGNOSIS — R6520 Severe sepsis without septic shock: Secondary | ICD-10-CM | POA: Diagnosis not present

## 2017-06-22 DIAGNOSIS — Z9911 Dependence on respirator [ventilator] status: Secondary | ICD-10-CM

## 2017-06-22 DIAGNOSIS — J9601 Acute respiratory failure with hypoxia: Secondary | ICD-10-CM | POA: Diagnosis not present

## 2017-06-22 DIAGNOSIS — B965 Pseudomonas (aeruginosa) (mallei) (pseudomallei) as the cause of diseases classified elsewhere: Secondary | ICD-10-CM | POA: Diagnosis present

## 2017-06-22 DIAGNOSIS — E873 Alkalosis: Secondary | ICD-10-CM | POA: Diagnosis not present

## 2017-06-22 DIAGNOSIS — J441 Chronic obstructive pulmonary disease with (acute) exacerbation: Principal | ICD-10-CM | POA: Diagnosis present

## 2017-06-22 DIAGNOSIS — J9690 Respiratory failure, unspecified, unspecified whether with hypoxia or hypercapnia: Secondary | ICD-10-CM | POA: Diagnosis present

## 2017-06-22 DIAGNOSIS — J189 Pneumonia, unspecified organism: Secondary | ICD-10-CM | POA: Diagnosis not present

## 2017-06-22 DIAGNOSIS — J9622 Acute and chronic respiratory failure with hypercapnia: Secondary | ICD-10-CM | POA: Diagnosis present

## 2017-06-22 DIAGNOSIS — R739 Hyperglycemia, unspecified: Secondary | ICD-10-CM | POA: Diagnosis present

## 2017-06-22 DIAGNOSIS — D649 Anemia, unspecified: Secondary | ICD-10-CM | POA: Diagnosis present

## 2017-06-22 DIAGNOSIS — A419 Sepsis, unspecified organism: Secondary | ICD-10-CM | POA: Diagnosis not present

## 2017-06-22 DIAGNOSIS — I509 Heart failure, unspecified: Secondary | ICD-10-CM

## 2017-06-22 DIAGNOSIS — L03116 Cellulitis of left lower limb: Secondary | ICD-10-CM | POA: Diagnosis present

## 2017-06-22 DIAGNOSIS — Z66 Do not resuscitate: Secondary | ICD-10-CM | POA: Diagnosis present

## 2017-06-22 DIAGNOSIS — F17210 Nicotine dependence, cigarettes, uncomplicated: Secondary | ICD-10-CM | POA: Diagnosis present

## 2017-06-22 DIAGNOSIS — I952 Hypotension due to drugs: Secondary | ICD-10-CM | POA: Diagnosis present

## 2017-06-22 DIAGNOSIS — I5033 Acute on chronic diastolic (congestive) heart failure: Secondary | ICD-10-CM | POA: Diagnosis present

## 2017-06-22 DIAGNOSIS — I110 Hypertensive heart disease with heart failure: Secondary | ICD-10-CM | POA: Diagnosis present

## 2017-06-22 DIAGNOSIS — T4275XA Adverse effect of unspecified antiepileptic and sedative-hypnotic drugs, initial encounter: Secondary | ICD-10-CM | POA: Diagnosis present

## 2017-06-22 DIAGNOSIS — T501X5A Adverse effect of loop [high-ceiling] diuretics, initial encounter: Secondary | ICD-10-CM | POA: Diagnosis present

## 2017-06-22 DIAGNOSIS — Z7189 Other specified counseling: Secondary | ICD-10-CM | POA: Diagnosis not present

## 2017-06-22 DIAGNOSIS — J9602 Acute respiratory failure with hypercapnia: Secondary | ICD-10-CM | POA: Diagnosis not present

## 2017-06-22 DIAGNOSIS — Z9981 Dependence on supplemental oxygen: Secondary | ICD-10-CM | POA: Diagnosis not present

## 2017-06-22 DIAGNOSIS — J151 Pneumonia due to Pseudomonas: Secondary | ICD-10-CM | POA: Diagnosis present

## 2017-06-22 DIAGNOSIS — Y95 Nosocomial condition: Secondary | ICD-10-CM | POA: Diagnosis present

## 2017-06-22 DIAGNOSIS — Z7982 Long term (current) use of aspirin: Secondary | ICD-10-CM

## 2017-06-22 DIAGNOSIS — G9340 Encephalopathy, unspecified: Secondary | ICD-10-CM | POA: Diagnosis not present

## 2017-06-22 DIAGNOSIS — Z515 Encounter for palliative care: Secondary | ICD-10-CM | POA: Diagnosis present

## 2017-06-22 DIAGNOSIS — Z79899 Other long term (current) drug therapy: Secondary | ICD-10-CM

## 2017-06-22 DIAGNOSIS — I480 Paroxysmal atrial fibrillation: Secondary | ICD-10-CM | POA: Diagnosis present

## 2017-06-22 DIAGNOSIS — J9621 Acute and chronic respiratory failure with hypoxia: Secondary | ICD-10-CM | POA: Diagnosis present

## 2017-06-22 DIAGNOSIS — Z7951 Long term (current) use of inhaled steroids: Secondary | ICD-10-CM

## 2017-06-22 DIAGNOSIS — E669 Obesity, unspecified: Secondary | ICD-10-CM | POA: Diagnosis present

## 2017-06-22 DIAGNOSIS — Z6834 Body mass index (BMI) 34.0-34.9, adult: Secondary | ICD-10-CM

## 2017-06-22 DIAGNOSIS — G4710 Hypersomnia, unspecified: Secondary | ICD-10-CM | POA: Diagnosis present

## 2017-06-22 LAB — CBC WITH DIFFERENTIAL/PLATELET
Basophils Absolute: 0 10*3/uL (ref 0–0.1)
Basophils Relative: 0 %
EOS PCT: 1 %
Eosinophils Absolute: 0.1 10*3/uL (ref 0–0.7)
HEMATOCRIT: 35 % (ref 35.0–47.0)
HEMOGLOBIN: 10.9 g/dL — AB (ref 12.0–16.0)
LYMPHS ABS: 0.7 10*3/uL — AB (ref 1.0–3.6)
LYMPHS PCT: 9 %
MCH: 28.9 pg (ref 26.0–34.0)
MCHC: 31.2 g/dL — ABNORMAL LOW (ref 32.0–36.0)
MCV: 92.6 fL (ref 80.0–100.0)
Monocytes Absolute: 0.8 10*3/uL (ref 0.2–0.9)
Monocytes Relative: 11 %
NEUTROS ABS: 6.1 10*3/uL (ref 1.4–6.5)
NEUTROS PCT: 79 %
Platelets: 101 10*3/uL — ABNORMAL LOW (ref 150–440)
RBC: 3.78 MIL/uL — AB (ref 3.80–5.20)
RDW: 16.5 % — ABNORMAL HIGH (ref 11.5–14.5)
WBC: 7.7 10*3/uL (ref 3.6–11.0)

## 2017-06-22 LAB — BASIC METABOLIC PANEL
Anion gap: 10 (ref 5–15)
BUN: 23 mg/dL — AB (ref 6–20)
CHLORIDE: 86 mmol/L — AB (ref 101–111)
CO2: 46 mmol/L — AB (ref 22–32)
Calcium: 9.2 mg/dL (ref 8.9–10.3)
Creatinine, Ser: 1.31 mg/dL — ABNORMAL HIGH (ref 0.44–1.00)
GFR calc Af Amer: 43 mL/min — ABNORMAL LOW (ref >60)
GFR calc non Af Amer: 37 mL/min — ABNORMAL LOW (ref >60)
GLUCOSE: 136 mg/dL — AB (ref 65–99)
POTASSIUM: 3.9 mmol/L (ref 3.5–5.1)
Sodium: 142 mmol/L (ref 135–145)

## 2017-06-22 LAB — BLOOD GAS, ARTERIAL
ACID-BASE EXCESS: 28.4 mmol/L — AB (ref 0.0–2.0)
BICARBONATE: 56.9 mmol/L — AB (ref 20.0–28.0)
FIO2: 0.4
LHR: 20 {breaths}/min
MECHVT: 450 mL
O2 Saturation: 94.2 %
PATIENT TEMPERATURE: 37
PEEP/CPAP: 5 cmH2O
PH ART: 7.46 — AB (ref 7.350–7.450)
PO2 ART: 68 mmHg — AB (ref 83.0–108.0)
pCO2 arterial: 80 mmHg (ref 32.0–48.0)

## 2017-06-22 LAB — TROPONIN I
TROPONIN I: 0.04 ng/mL — AB (ref <0.03)
Troponin I: <0.03 ng/mL (ref <0.03)

## 2017-06-22 LAB — BLOOD GAS, VENOUS
Acid-Base Excess: UNDETERMINED mmol/L (ref 0.0–2.0)
BICARBONATE: UNDETERMINED mmol/L (ref 20.0–28.0)
O2 SAT: UNDETERMINED %
PATIENT TEMPERATURE: 37
PCO2 VEN: 120 mmHg — AB (ref 44.0–60.0)
PO2 VEN: 59 mmHg — AB (ref 32.0–45.0)
pH, Ven: 7.26 (ref 7.250–7.430)

## 2017-06-22 LAB — GLUCOSE, CAPILLARY
GLUCOSE-CAPILLARY: 257 mg/dL — AB (ref 65–99)
Glucose-Capillary: 112 mg/dL — ABNORMAL HIGH (ref 65–99)

## 2017-06-22 LAB — BRAIN NATRIURETIC PEPTIDE: B Natriuretic Peptide: 242 pg/mL — ABNORMAL HIGH (ref 0.0–100.0)

## 2017-06-22 LAB — TRIGLYCERIDES: TRIGLYCERIDES: 53 mg/dL (ref <150)

## 2017-06-22 MED ORDER — SODIUM CHLORIDE 0.9 % IV SOLN: 250.0000 mL | INTRAVENOUS | Status: DC | PRN

## 2017-06-22 MED ORDER — IPRATROPIUM-ALBUTEROL 0.5-2.5 (3) MG/3ML IN SOLN
3.0000 mL | Freq: Once | RESPIRATORY_TRACT | Status: AC
  Administered 2017-06-22: 3 mL via RESPIRATORY_TRACT

## 2017-06-22 MED ORDER — NOREPINEPHRINE BITARTRATE 1 MG/ML IV SOLN
0.0000 ug/min | INTRAVENOUS | Status: DC
  Administered 2017-06-22 (×2): 2 ug/min via INTRAVENOUS
  Administered 2017-06-23: 8 ug/min via INTRAVENOUS
  Administered 2017-06-23: 5 ug/min via INTRAVENOUS
  Administered 2017-06-24: 4 ug/min via INTRAVENOUS
  Filled 2017-06-22 (×7): qty 4

## 2017-06-22 MED ORDER — POTASSIUM CHLORIDE IN NACL 40-0.9 MEQ/L-% IV SOLN
INTRAVENOUS | Status: DC
  Administered 2017-06-22 – 2017-06-23 (×2): 50 mL/h via INTRAVENOUS
  Filled 2017-06-22 (×4): qty 1000

## 2017-06-22 MED ORDER — PROPOFOL 1000 MG/100ML IV EMUL
5.0000 ug/kg/min | Freq: Once | INTRAVENOUS | Status: AC
  Administered 2017-06-22: 40 ug/kg/min via INTRAVENOUS

## 2017-06-22 MED ORDER — PROPOFOL 1000 MG/100ML IV EMUL
0.0000 ug/kg/min | INTRAVENOUS | Status: DC
  Administered 2017-06-22 – 2017-06-23 (×5): 50 ug/kg/min via INTRAVENOUS
  Administered 2017-06-23: 25 ug/kg/min via INTRAVENOUS
  Administered 2017-06-23: 40 ug/kg/min via INTRAVENOUS
  Administered 2017-06-24: 25 ug/kg/min via INTRAVENOUS
  Administered 2017-06-24: 30 ug/kg/min via INTRAVENOUS
  Administered 2017-06-24: 40 ug/kg/min via INTRAVENOUS
  Administered 2017-06-25 (×2): 25 ug/kg/min via INTRAVENOUS
  Administered 2017-06-26: 15 ug/kg/min via INTRAVENOUS
  Filled 2017-06-22 (×14): qty 100

## 2017-06-22 MED ORDER — SODIUM CHLORIDE 0.9% FLUSH
3.0000 mL | Freq: Two times a day (BID) | INTRAVENOUS | Status: DC
  Administered 2017-06-23 – 2017-06-30 (×14): 3 mL via INTRAVENOUS

## 2017-06-22 MED ORDER — METHYLPREDNISOLONE SODIUM SUCC 125 MG IJ SOLR
80.0000 mg | Freq: Two times a day (BID) | INTRAMUSCULAR | Status: DC
Start: 2017-06-22 — End: 2017-06-23
  Administered 2017-06-23: 80 mg via INTRAVENOUS
  Filled 2017-06-22: qty 2

## 2017-06-22 MED ORDER — FAMOTIDINE IN NACL 20-0.9 MG/50ML-% IV SOLN
20.0000 mg | INTRAVENOUS | Status: DC
  Administered 2017-06-22 – 2017-06-25 (×4): 20 mg via INTRAVENOUS
  Filled 2017-06-22 (×4): qty 50

## 2017-06-22 MED ORDER — FUROSEMIDE 10 MG/ML IJ SOLN
60.0000 mg | Freq: Once | INTRAMUSCULAR | Status: AC
Start: 2017-06-22 — End: 2017-06-22
  Administered 2017-06-22: 60 mg via INTRAVENOUS
  Filled 2017-06-22: qty 8

## 2017-06-22 MED ORDER — DOXYCYCLINE HYCLATE 100 MG IV SOLR
100.0000 mg | Freq: Two times a day (BID) | INTRAVENOUS | Status: DC
  Administered 2017-06-22 – 2017-06-24 (×4): 100 mg via INTRAVENOUS
  Filled 2017-06-22 (×6): qty 100

## 2017-06-22 MED ORDER — AMIODARONE HCL 200 MG PO TABS
200.0000 mg | ORAL_TABLET | Freq: Every day | ORAL | Status: DC
  Administered 2017-06-23 – 2017-06-25 (×3): 200 mg
  Filled 2017-06-22 (×3): qty 1

## 2017-06-22 MED ORDER — ACETAZOLAMIDE SODIUM 500 MG IJ SOLR
500.0000 mg | Freq: Once | INTRAMUSCULAR | Status: AC
  Administered 2017-06-22: 500 mg via INTRAVENOUS
  Filled 2017-06-22: qty 500

## 2017-06-22 MED ORDER — FAMOTIDINE IN NACL 20-0.9 MG/50ML-% IV SOLN: 20.0000 mg | Freq: Two times a day (BID) | INTRAVENOUS | Status: DC

## 2017-06-22 MED ORDER — PANTOPRAZOLE SODIUM 40 MG IV SOLR: 40.0000 mg | Freq: Two times a day (BID) | INTRAVENOUS | Status: DC

## 2017-06-22 MED ORDER — IPRATROPIUM-ALBUTEROL 0.5-2.5 (3) MG/3ML IN SOLN: 3.0000 mL | Freq: Four times a day (QID) | RESPIRATORY_TRACT | Status: DC

## 2017-06-22 MED ORDER — HEPARIN SODIUM (PORCINE) 5000 UNIT/ML IJ SOLN: 5000.0000 [IU] | Freq: Three times a day (TID) | INTRAMUSCULAR | Status: DC

## 2017-06-22 MED ORDER — FUROSEMIDE 10 MG/ML IJ SOLN: 40.0000 mg | Freq: Two times a day (BID) | INTRAMUSCULAR | Status: DC

## 2017-06-22 MED ORDER — ONDANSETRON HCL 4 MG/2ML IJ SOLN: 4.0000 mg | Freq: Four times a day (QID) | INTRAMUSCULAR | Status: DC | PRN

## 2017-06-22 MED ORDER — ALBUTEROL SULFATE (2.5 MG/3ML) 0.083% IN NEBU: 2.5000 mg | INHALATION_SOLUTION | RESPIRATORY_TRACT | Status: DC | PRN

## 2017-06-22 MED ORDER — PROPOFOL 1000 MG/100ML IV EMUL
INTRAVENOUS | Status: AC
  Filled 2017-06-22: qty 100

## 2017-06-22 MED ORDER — SUCCINYLCHOLINE CHLORIDE 20 MG/ML IJ SOLN
100.0000 mg | Freq: Once | INTRAMUSCULAR | Status: AC
  Administered 2017-06-22: 100 mg via INTRAVENOUS

## 2017-06-22 MED ORDER — ETOMIDATE 2 MG/ML IV SOLN
20.0000 mg | Freq: Once | INTRAVENOUS | Status: AC
  Administered 2017-06-22: 20 mg via INTRAVENOUS

## 2017-06-22 MED ORDER — ALBUTEROL SULFATE (2.5 MG/3ML) 0.083% IN NEBU
2.5000 mg | INHALATION_SOLUTION | RESPIRATORY_TRACT | Status: DC | PRN
  Administered 2017-06-26: 2.5 mg via RESPIRATORY_TRACT
  Filled 2017-06-22: qty 3

## 2017-06-22 MED ORDER — ACETAMINOPHEN 325 MG PO TABS
650.0000 mg | ORAL_TABLET | ORAL | Status: DC | PRN
  Administered 2017-06-22 – 2017-06-30 (×2): 650 mg via ORAL
  Filled 2017-06-22 (×2): qty 2

## 2017-06-22 MED ORDER — METHYLPREDNISOLONE SODIUM SUCC 125 MG IJ SOLR: 60.0000 mg | Freq: Four times a day (QID) | INTRAMUSCULAR | Status: DC

## 2017-06-22 MED ORDER — SODIUM CHLORIDE 0.9% FLUSH: 3.0000 mL | INTRAVENOUS | Status: DC | PRN

## 2017-06-22 MED ORDER — INSULIN ASPART 100 UNIT/ML ~~LOC~~ SOLN
0.0000 [IU] | SUBCUTANEOUS | Status: DC
  Administered 2017-06-22: 6 [IU] via SUBCUTANEOUS
  Administered 2017-06-22 – 2017-06-23 (×3): 3 [IU] via SUBCUTANEOUS
  Administered 2017-06-23: 5 [IU] via SUBCUTANEOUS
  Administered 2017-06-23: 2 [IU] via SUBCUTANEOUS
  Administered 2017-06-23 – 2017-06-24 (×4): 3 [IU] via SUBCUTANEOUS
  Administered 2017-06-24: 2 [IU] via SUBCUTANEOUS
  Administered 2017-06-24: 3 [IU] via SUBCUTANEOUS
  Administered 2017-06-24: 2 [IU] via SUBCUTANEOUS
  Administered 2017-06-25: 3 [IU] via SUBCUTANEOUS
  Administered 2017-06-25 – 2017-06-26 (×5): 2 [IU] via SUBCUTANEOUS
  Administered 2017-06-26: 3 [IU] via SUBCUTANEOUS
  Administered 2017-06-26 – 2017-06-27 (×2): 2 [IU] via SUBCUTANEOUS
  Administered 2017-06-27: 3 [IU] via SUBCUTANEOUS
  Administered 2017-06-27 (×2): 2 [IU] via SUBCUTANEOUS
  Administered 2017-06-27 – 2017-06-28 (×4): 3 [IU] via SUBCUTANEOUS
  Administered 2017-06-28 (×3): 2 [IU] via SUBCUTANEOUS
  Administered 2017-06-28: 3 [IU] via SUBCUTANEOUS
  Administered 2017-06-29 (×3): 2 [IU] via SUBCUTANEOUS
  Filled 2017-06-22 (×37): qty 1

## 2017-06-22 MED ORDER — BUDESONIDE 0.25 MG/2ML IN SUSP
0.2500 mg | Freq: Four times a day (QID) | RESPIRATORY_TRACT | Status: DC
  Administered 2017-06-22 – 2017-07-01 (×36): 0.25 mg via RESPIRATORY_TRACT
  Filled 2017-06-22 (×37): qty 2

## 2017-06-22 MED ORDER — ASPIRIN 300 MG RE SUPP
300.0000 mg | Freq: Every day | RECTAL | Status: DC
  Administered 2017-06-22: 300 mg via RECTAL

## 2017-06-22 MED ORDER — IPRATROPIUM-ALBUTEROL 0.5-2.5 (3) MG/3ML IN SOLN
RESPIRATORY_TRACT | Status: AC
  Filled 2017-06-22: qty 9

## 2017-06-22 MED ORDER — FENTANYL CITRATE (PF) 100 MCG/2ML IJ SOLN
50.0000 ug | INTRAMUSCULAR | Status: DC | PRN
  Administered 2017-06-23 – 2017-06-24 (×4): 50 ug via INTRAVENOUS
  Filled 2017-06-22 (×4): qty 2

## 2017-06-22 MED ORDER — IPRATROPIUM-ALBUTEROL 0.5-2.5 (3) MG/3ML IN SOLN
3.0000 mL | Freq: Four times a day (QID) | RESPIRATORY_TRACT | Status: DC
  Administered 2017-06-22 – 2017-07-01 (×36): 3 mL via RESPIRATORY_TRACT
  Filled 2017-06-22 (×36): qty 3

## 2017-06-22 NOTE — ED Notes (Signed)
EMS gave 2 duonebs in route and 125mg  SoluMedrol IM

## 2017-06-22 NOTE — ED Triage Notes (Signed)
Pt arrived via ems for c/o shortness of breath - when ems arrived o2 sat 65% - duoneb given x2 and placed on 6L via neb mask improving sat to 100% - pt was a&o x3 on ems arrival at this time pt is alert only to verbal stimuli and is not responsive by voice at all - Dr Don PerkingVeronese notified and orders given

## 2017-06-22 NOTE — ED Notes (Signed)
Pt not responding even on bi=pap - Dr Don PerkingVeronese determined to intubate pt at this time - [facility]

## 2017-06-22 NOTE — ED Notes (Signed)
Pt fighting ET tube and attempting to pull tube out

## 2017-06-22 NOTE — H&P (Signed)
[facility] - [place] [facility]   PATIENT NAME: Dana [name]    MR#:  [id number]  DATE OF BIRTH:  22-Feb-1933  DATE OF ADMISSION:  July 17, 2017  PRIMARY CARE PHYSICIAN: Barron Alvine, MD   REQUESTING/REFERRING PHYSICIAN: Nita Sickle, MD  CHIEF COMPLAINT:  sob  HISTORY OF PRESENT ILLNESS:  Dana [name]  is a 83 y.o. female with a known history of chronic COPD, hypertension, chronic congestive heart failure on loop diuretics is brought into the ED via EMS for severe shortness of breath. Patient was waiting to see her primary care physician but as her shortness of breath has been gotten worse family decided to call EMS and patient is brought into the ED. Patient chronically lives on 3 L of oxygen but patient was extremity hypoxemic at 70% with a significant rales and wheezes. Patient Glasgow Coma Scale was at 9 by the time of arrival. Patient was given broncho-dilator therapy and Solu-Medrol IV and was intubated. Hospitalist team is called to admit the patient. Her daughter and granddaughter at bedside.  PAST MEDICAL HISTORY:   Past Medical History:  Diagnosis Date  . CHF (congestive heart failure) (HCC)   . COPD (chronic obstructive pulmonary disease) (HCC)     PAST SURGICAL HISTOIRY:   Past Surgical History:  Procedure Laterality Date  . HERNIA REPAIR     around [employer] area    SOCIAL HISTORY:   Social History   Tobacco Use  . Smoking status: Current Every Day Smoker    Packs/day: 1.00    Types: Cigarettes  . Smokeless tobacco: Never Used  Substance Use Topics  . Alcohol use: No    FAMILY HISTORY:   Family History  Problem Relation Age of Onset  . Cancer Mother   . COPD Father     DRUG ALLERGIES:  No Known Allergies  REVIEW OF SYSTEMS:  ROS unobtainable  MEDICATIONS AT HOME:   Prior to Admission medications   Medication Sig Start Date End Date Taking? Authorizing Provider  acetaminophen (TYLENOL) 325 MG tablet Take  325-650 mg by mouth every 6 (six) hours as needed for headache.   Yes [provider]  albuterol (PROVENTIL HFA;VENTOLIN HFA) 108 (90 Base) MCG/ACT inhaler Inhale 2 puffs into the lungs every 6 (six) hours as needed for wheezing. 02/13/16 07-17-2017 Yes [provider]  amiodarone (PACERONE) 200 MG tablet Take 1 tablet (200 mg total) by mouth daily. 06/01/17  Yes Houston Siren, MD  aspirin 81 MG chewable tablet Chew 1 tablet (81 mg total) by mouth daily. 04/01/16  Yes Regalado, Belkys A, MD  atorvastatin (LIPITOR) 40 MG tablet Take 40 mg by mouth every evening.   Yes [provider]  budesonide (PULMICORT) 0.5 MG/2ML nebulizer solution Take 2 mLs (0.5 mg total) by nebulization 2 (two) times daily. 04/01/16  Yes Regalado, Belkys A, MD  feeding supplement, ENSURE ENLIVE, (ENSURE ENLIVE) LIQD Take 237 mLs by mouth 2 (two) times daily between meals. 04/01/16  Yes Regalado, Belkys A, MD  furosemide (LASIX) 20 MG tablet Take 60 mg by mouth.   Yes [provider]  gabapentin (NEURONTIN) 100 MG capsule Take 200 mg by mouth 3 (three) times daily.   Yes [provider]  ipratropium-albuterol (DUONEB) 0.5-2.5 (3) MG/3ML SOLN Take 3 mLs by nebulization every 6 (six) hours. 04/01/16  Yes Regalado, Belkys A, MD  senna-docusate (SENOKOT-S) 8.6-50 MG tablet Take 1 tablet by mouth 2 (two) times daily. 04/01/16  Yes Regalado, Prentiss Bells, MD  ergocalciferol (VITAMIN D2) 50000 units capsule Take 50,000 Units by mouth once a week.    [provider]  nicotine (NICODERM CQ - DOSED IN MG/24 HOURS) 21 mg/24hr patch Place 1 patch (21 mg total) onto the skin daily. Patient not taking: Reported on 05/26/2017 04/01/16   Regalado, Jon BillingsBelkys A, MD      VITAL SIGNS:  Blood pressure (!) 162/147, pulse (!) 49, temperature (!) 97.5 F (36.4 C), temperature source Axillary, resp. rate 17, height 4\' 9"  (1.448 m), weight 72.7 kg (160 lb 4.4 oz), SpO2 100 %.  PHYSICAL EXAMINATION:  GENERAL:   83 y.o.-year-old patient lying in the bed  EYES: Pupils equal, round, reactive to light and accommodation. No scleral icterus.  HEENT: Head atraumatic, normocephalic. ET tube intact NECK:  Supple, no jugular venous distention. No thyroid enlargement, no tenderness.  LUNGS: Mod coarse  breath sounds bilaterally, no wheezing, has rales,rhonchi or crepitation. No use of accessory muscles of respiration.  CARDIOVASCULAR: S1, S2 normal. No murmurs, rubs, or gallops.  ABDOMEN: Soft, nontender, nondistended. Bowel sounds present.  EXTREMITIES: No pedal edema, cyanosis, or clubbing.  NEUROLOGIC: pt is on propofol  PSYCHIATRIC: The patient is intubated  SKIN: No obvious rash, lesion, or ulcer.   LABORATORY PANEL:   CBC Recent Labs  Lab 06/08/2017 1215  WBC 7.7  HGB 10.9*  HCT 35.0  PLT 101*   ------------------------------------------------------------------------------------------------------------------  Chemistries  Recent Labs  Lab 06/27/2017 1215  NA 142  K 3.9  CL 86*  CO2 46*  GLUCOSE 136*  BUN 23*  CREATININE 1.31*  CALCIUM 9.2   ------------------------------------------------------------------------------------------------------------------  Cardiac Enzymes Recent Labs  Lab 06/01/2017 1215  TROPONINI <0.03   ------------------------------------------------------------------------------------------------------------------  RADIOLOGY:  Dg Chest Portable 1 View  Result Date: 06/29/2017 CLINICAL DATA:  Intubation EXAM: PORTABLE CHEST 1 VIEW COMPARISON:  June 22, 2017 FINDINGS: The ETT terminates 17 mm above the carina. An OG tube terminates below today's film. Improving mild edema. Right greater than left pleural effusions with underlying opacities remain, unchanged. IMPRESSION: 1. The ETT terminates 17 mm above the carina. Recommend withdrawing 1 cm. 2. Improving edema. 3. Right greater than left pleural effusions with underlying opacities, similar in the interval.  Electronically Signed   By: Gerome Samavid  Williams III M.D   On: 06/04/2017 13:34   Dg Chest Portable 1 View  Result Date: 06/02/2017 CLINICAL DATA:  Shortness of breath. EXAM: PORTABLE CHEST 1 VIEW COMPARISON:  May 29, 2017 FINDINGS: Cardiomegaly. Right greater than left pleural effusions with underlying opacities. Diffuse interstitial opacity. No other interval change. IMPRESSION: Findings are consistent with pulmonary edema. Bilateral pleural effusions with underlying opacities, right greater than left. The findings are similar in the interval. Electronically Signed   By: Gerome Samavid  Williams III M.D   On: 06/25/2017 12:36   Dg Abd Portable 1 View  Result Date: 06/13/2017 CLINICAL DATA:  Orogastric tube placement EXAM: PORTABLE ABDOMEN - 1 VIEW COMPARISON:  February 16, 2015 FINDINGS: Orogastric tube tip and side port are in the stomach. There is moderate stool throughout the colon. There is no bowel dilatation or air-fluid level to suggest bowel obstruction. No free air. There is aortic atherosclerosis. There is a small left pleural effusion. IMPRESSION: Orogastric tube tip and side port in stomach. No bowel obstruction or free air. Moderate stool throughout colon. Small left pleural effusion. There is aortic atherosclerosis. Aortic Atherosclerosis (ICD10-I70.0). Electronically Signed   By: Bretta BangWilliam  Woodruff III M.D.   On: 06/13/2017 13:33    EKG:  Orders placed or performed during the hospital encounter of 2016-07-09  . ED EKG  . ED EKG  . EKG 12-Lead  . EKG 12-Lead    IMPRESSION AND PLAN:    Hassie Bruceeggy Purdom  is a 83 y.o. female with a known history of chronic COPD, hypertension, chronic congestive heart failure on loop diuretics is brought into the ED via EMS for severe shortness of breath. Patient was waiting to see her primary care physician but as her shortness of breath has been gotten worse family decided to call EMS and patient is brought into the ED. Patient chronically lives on 3 L of  oxygen but patient was extremity hypoxemic at 70% with a significant rales and wheezes. Patient Glasgow Coma Scale was at 479 by the time of arrival  #Acute encephalopathy secondary to severe hypercarbia Admitted to intensive care unit Patient is intubated   #Acute hypoxic and hypercarbic respiratory failure secondary to COPD exacerbation and CHF exacerbation Intubated Vent management per protocol Solu-Medrol  Lasix IV  Discussed with intensivist  #Acute COPD exacerbation Solum Medrol and bronchodilator therapy  #Severe metabolic alkalosis due to chronic hypercarbic respiratory failure and loop diuretic use with history of acute on chronic CHF Will get echocardiogram Cardiology consulted Monitor daily weights Lasix iv   #Hypotension On Levophed  #Thrombocytopenia No active bleeding or bruising  DVT prophylaxis with SCDs   All the records are reviewed and case discussed with ED provider. Management plans discussed with the patient's daughter and grand daughter at bed side and they are in agreement.  CODE STATUS: fc, healthcare power of attorney daughter Dana NixonJanice   TOTAL critical care TIME TAKING CARE OF THIS PATIENT: 45 minutes.   Note: This dictation was prepared with Dragon dictation along with smaller phrase technology. Any transcriptional errors that result from this process are unintentional.  Ramonita LabGouru, Yardley Lekas M.D on 01-23-17 at 5:02 PM  Between 7am to 6pm - Pager - [phone]  After 6pm go to [email or web address] - password [email or web address] [facility]  [facility]  Office  [phone]  CC: Primary care physician; Barron AlvineGibson, David, MD

## 2017-06-22 NOTE — Progress Notes (Signed)
Decreased RR to 15 per Dr Sung AmabileSimonds order.

## 2017-06-22 NOTE — Consult Note (Signed)
PULMONARY / CRITICAL CARE MEDICINE   Name: Dana [name] MRN: [id number] DOB: 01/08/1933    ADMISSION DATE:  09-28-16 CONSULTATION DATE: 09/28/2016  REFERRING MD:  Amado CoeGouru  PT PROFILE: [age] F with end-stage COPD intubated in the ED for acute/chronic hypoxemic/hypercarbic respiratory failure and hypersomnolence.  [facility]  During that hospitalization, did not require intubation but did require noninvasive ventilation for a couple of days.  MAJOR EVENTS/TEST RESULTS: 12/24 Admitted    INDWELLING DEVICES:: ETT 12/24 >>   MICRO DATA: MRSA PCR 12/24 >>  Urine 12/24  >>  Resp 12/24 >>   ANTIMICROBIALS:  Doxycycline 12/24 >>     HISTORY OF PRESENT ILLNESS:   [age] F [facility] on chronic hypercarbic respiratory failure and left lower extremity cellulitis.  During that hospitalization, [facility] while she was on BiPAP in the stepdown unit.  At that time, we initiated discussions regarding goals of care and she continued to desire full aggressive support including mechanical ventilation if needed.  Over the past couple of weeks she has done fairly well according to her granddaughter until the past couple days, she has developed progressive hypersomnolence and alteration in cognition.  The patient's family monitors her oxygen saturations at home and she was noted to be significantly hypoxemic on the day of this admission.  She was brought to the emergency department where she underwent intubation.  Per the family, she has had no fevers chills or sweats.  There is been no chest pain or significant cough with sputum production.  She has chronic lower extremity edema without significant change from her baseline.  PAST MEDICAL HISTORY :  She  has a past medical history of CHF (congestive heart failure) (HCC) and COPD (chronic obstructive pulmonary disease) (HCC).  PAST SURGICAL HISTORY: She  has a  past surgical history that includes Hernia repair.  No Known Allergies  No current facility-administered medications on file prior to encounter.    Current Outpatient Medications on File Prior to Encounter  Medication Sig  . acetaminophen (TYLENOL) 325 MG tablet Take 325-650 mg by mouth every 6 (six) hours as needed for headache.  . albuterol (PROVENTIL HFA;VENTOLIN HFA) 108 (90 Base) MCG/ACT inhaler Inhale 2 puffs into the lungs every 6 (six) hours as needed for wheezing.  Marland Kitchen. amiodarone (PACERONE) 200 MG tablet Take 1 tablet (200 mg total) by mouth daily.  Marland Kitchen. aspirin 81 MG chewable tablet Chew 1 tablet (81 mg total) by mouth daily.  Marland Kitchen. atorvastatin (LIPITOR) 40 MG tablet Take 40 mg by mouth every evening.  . budesonide (PULMICORT) 0.5 MG/2ML nebulizer solution Take 2 mLs (0.5 mg total) by nebulization 2 (two) times daily.  . feeding supplement, ENSURE ENLIVE, (ENSURE ENLIVE) LIQD Take 237 mLs by mouth 2 (two) times daily between meals.  . furosemide (LASIX) 20 MG tablet Take 60 mg by mouth.  . gabapentin (NEURONTIN) 100 MG capsule Take 200 mg by mouth 3 (three) times daily.  Marland Kitchen. ipratropium-albuterol (DUONEB) 0.5-2.5 (3) MG/3ML SOLN Take 3 mLs by nebulization every 6 (six) hours.  . senna-docusate (SENOKOT-S) 8.6-50 MG tablet Take 1 tablet by mouth 2 (two) times daily.  . ergocalciferol (VITAMIN D2) 50000 units capsule Take 50,000 Units by mouth once a week.  . nicotine (NICODERM CQ - DOSED IN MG/24 HOURS) 21 mg/24hr patch Place 1 patch (21 mg total) onto the skin daily. (Patient not taking: Reported on 05/26/2017)    FAMILY  HISTORY:  Her indicated that her mother is deceased. She indicated that her father is deceased.   SOCIAL HISTORY: She  reports that she has been smoking cigarettes.  She has been smoking about 1.00 pack per day. she has never used smokeless tobacco. She reports that she does not drink alcohol or use drugs.  REVIEW OF SYSTEMS:   Level 5 caveat  SUBJECTIVE:    VITAL  SIGNS: BP (!) 122/57   Pulse 65   Temp 97.8 F (36.6 C) (Axillary)   Resp 20   Ht 4\' 7"  (1.397 m)   Wt 68 kg (150 lb)   SpO2 100%   BMI 34.86 kg/m   HEMODYNAMICS:    VENTILATOR SETTINGS: Vent Mode: PRVC FiO2 (%):  [40 %] 40 % Set Rate:  [20 bmp] 20 bmp Vt Set:  [450 mL] 450 mL PEEP:  [5 cmH20] 5 cmH20  INTAKE / OUTPUT: No intake/output data recorded.  PHYSICAL EXAMINATION: General: Chronically ill-appearing, intubated Neuro: CNs intact, moves all extremities HEENT: NCAT, sclerae white Cardiovascular: Bradycardic (55/min), sinus rhythm, no M noted Lungs: Diffuse wheezes Abdomen: Obese, diminished bowel sounds, no palpable masses Extremities: Symmetric pretibial, ankle, pedal edema with erythema in both lower extremities  LABS:  BMET Recent Labs  Lab 2017-05-01 1215  NA 142  K 3.9  CL 86*  CO2 46*  BUN 23*  CREATININE 1.31*  GLUCOSE 136*    Electrolytes Recent Labs  Lab 2017-05-01 1215  CALCIUM 9.2    CBC Recent Labs  Lab 2017-05-01 1215  WBC 7.7  HGB 10.9*  HCT 35.0  PLT 101*    Coag's No results for input(s): APTT, INR in the last 168 hours.  Sepsis Markers No results for input(s): LATICACIDVEN, PROCALCITON, O2SATVEN in the last 168 hours.  ABG Recent Labs  Lab 2017-05-01 1406  PHART 7.46*  PCO2ART 80*  PO2ART 68*    Liver Enzymes No results for input(s): AST, ALT, ALKPHOS, BILITOT, ALBUMIN in the last 168 hours.  Cardiac Enzymes Recent Labs  Lab 2017-05-01 1215  TROPONINI <0.03    Glucose No results for input(s): GLUCAP in the last 168 hours.  CXR: Small R >L effusions.  No overt pulmonary edema     ASSESSMENT / PLAN:  PULMONARY A: Acute/chronic hypercarbic/hypoxemic respiratory failure End-stage COPD with acute exacerbation Small bilateral pleural effusions P:   Vent settings established Vent bundle implemented Daily SBT as indicated Nebulized steroids and bronchodilators Systemic steroids  CARDIOVASCULAR A:   History of diastolic heart failure -I do not think this is an acute decompensation Suspect component of pulmonary hypertension Hypotension post intubation P:  Continue norepinephrine to maintain MAP >60 mmHg   RENAL A:   Severe metabolic alkalosis - due to chronic hypercarbic respiratory failure and loop diuresis P:   Monitor BMET intermittently Monitor I/Os Correct electrolytes as indicated  IVFs ordered Acetazolamide X 1 dose 12/24  GASTROINTESTINAL A:   Obesity P:   SUP: IV famotidine Consider TF protocol 12/25  HEMATOLOGIC A:   Chronic anemia Mild thrombocytopenia P:  DVT px: SCDs Monitor CBC intermittently Transfuse per usual guidelines   INFECTIOUS A:   COPD exacerbation P:   Monitor temp, WBC count Micro and abx as above   ENDOCRINE A:   Mild hyperglycemia without dx of DM P:   SSI while on systemic steroids  NEUROLOGIC A:   Acute encephalopathy due to severe hypercarbia ICU/ventilator associated discomfort Poor baseline functional status P:   RASS goal: -1, -2 PAD protocol -propofol,  intermittent fentanyl Began discussion regarding goals of care with patient's daughter and granddaughter   FAMILY  - Updates: Daughter and granddaughter updated at bedside in ED   CCM time: 35 mins The above time includes time spent in consultation with patient and/or family members and reviewing care plan on multidisciplinary rounds  Billy Fischer, MD PCCM service Mobile [phone] Pager [phone]    06/13/2017, 2:57 PM

## 2017-06-22 NOTE — ED Notes (Signed)
Dr Don PerkingVeronese intubated measured 22 at the lip with good color change

## 2017-06-22 NOTE — ED Notes (Signed)
BP 182/108 and fighting ET tube and attempting to pull tube out

## 2017-06-22 NOTE — ED Notes (Signed)
RT notified that ET tube needs to be drawn back 1cm

## 2017-06-22 NOTE — ED Provider Notes (Signed)
[facility] Emergency Department Provider Note  ____________________________________________  Time seen: Approximately 12:34 PM  I have reviewed the triage vital signs and the nursing notes.   HISTORY  Chief Complaint Shortness of Breath  Level 5 caveat:  Portions of the history and physical were unable to be obtained due to nonverbal   HPI Dana [name] is a 83 y.o. female with h/o HFpEF, COPD, and hypertension who presents for evaluation of shortness of breath. According to EMS patient has been more short of breath over the course of the last week. She was waiting to see her primary care doctor and her upcoming appointment next week but today her family decided to call EMS because she was becoming worse. Patient is supposed to be on 3 L nasal cannula at home however per EMS that she has several feet of tubing between her cannula and the oxygen tank. She was hypoxic to the 70s with significant crackles and wheezes. She was given 1 DuoNeb and 125 mg of Solu-Medrol. In route to the emergency department EMS reports that her mental status started to deteriorate.Patient unable to provide any history at this time.  Past Medical History:  Diagnosis Date  . CHF (congestive heart failure) (HCC)   . COPD (chronic obstructive pulmonary disease) [facility](HCC)     Patient Active Problem List   Diagnosis Date Noted  . DNR (do not resuscitate) discussion   . Palliative care by specialist   . COPD exacerbation (HCC)   . Sepsis (HCC) 05/26/2017  . Acute respiratory failure with hypoxia and hypercarbia (HCC)   . Acute on chronic congestive heart failure (HCC)   . Respiratory failure (HCC) 03/24/2016  . CAP (community acquired pneumonia)   . Demand ischemia [facility](HCC)     Past Surgical History:  Procedure Laterality Date  . HERNIA REPAIR     around [place]    Prior to Admission medications   Medication Sig Start Date End Date Taking? Authorizing Provider    acetaminophen (TYLENOL) 325 MG tablet Take 325-650 mg by mouth every 6 (six) hours as needed for headache.    [provider]  albuterol (PROVENTIL HFA;VENTOLIN HFA) 108 (90 Base) MCG/ACT inhaler Inhale 2 puffs into the lungs every 6 (six) hours as needed for wheezing. 02/13/16 05/26/17  [provider]  amiodarone (PACERONE) 200 MG tablet Take 1 tablet (200 mg total) by mouth daily. 06/01/17   Houston SirenSainani, Vivek J, MD  aspirin 81 MG chewable tablet Chew 1 tablet (81 mg total) by mouth daily. 04/01/16   Regalado, Belkys A, MD  atorvastatin (LIPITOR) 40 MG tablet Take 40 mg by mouth every evening.    [provider]  budesonide (PULMICORT) 0.5 MG/2ML nebulizer solution Take 2 mLs (0.5 mg total) by nebulization 2 (two) times daily. 04/01/16   Regalado, Belkys A, MD  ergocalciferol (VITAMIN D2) 50000 units capsule Take 50,000 Units by mouth once a week.    [provider]  feeding supplement, ENSURE ENLIVE, (ENSURE ENLIVE) LIQD Take 237 mLs by mouth 2 (two) times daily between meals. 04/01/16   Regalado, Belkys A, MD  furosemide (LASIX) 20 MG tablet Take 60 mg by mouth.    [provider]  gabapentin (NEURONTIN) 100 MG capsule Take 200 mg by mouth 3 (three) times daily.    [provider]  ipratropium-albuterol (DUONEB) 0.5-2.5 (3) MG/3ML SOLN Take 3 mLs by nebulization every 6 (six) hours. 04/01/16   Regalado, Belkys A, MD  nicotine (NICODERM CQ -  DOSED IN MG/24 HOURS) 21 mg/24hr patch Place 1 patch (21 mg total) onto the skin daily. Patient not taking: Reported on 05/26/2017 04/01/16   Regalado, Jon Billings A, MD  predniSONE (DELTASONE) 10 MG tablet Label  & dispense according to the schedule below. 5 Pills PO for 2 days then, 4 Pills PO for 2 days, 3 Pills PO for 2 days, 2 Pills PO for 2 days, 1 Pill PO for 2 days then STOP. 06/01/17   Houston Siren, MD  senna-docusate (SENOKOT-S) 8.6-50 MG tablet Take 1 tablet by mouth 2 (two) times daily. 04/01/16   Regalado,  Prentiss Bells, MD    Allergies Patient has no known allergies.  Family History  Problem Relation Age of Onset  . Cancer Mother   . COPD Father     Social History Social History   Tobacco Use  . Smoking status: Current Every Day Smoker    Packs/day: 1.00    Types: Cigarettes  . Smokeless tobacco: Never Used  Substance Use Topics  . Alcohol use: No  . Drug use: No    Review of Systems  Respiratory: + shortness of breath.   Level 5 caveat:  Portions of the history and physical were unable to be obtained due to non verbal/ ams  ____________________________________________   PHYSICAL EXAM:  VITAL SIGNS: ED Triage Vitals  Enc Vitals Group     BP Jul 10, 2017 1213 126/85     Pulse Rate 2017-07-10 1213 61     Resp 07-10-2017 1213 20     Temp July 10, 2017 1213 97.8 F (36.6 C)     Temp Source 07/10/17 1213 Axillary     SpO2 July 10, 2017 1206 100 %     Weight 2017/07/10 1208 150 lb (68 kg)     Height 07-10-17 1208 4\' 7"  (1.397 m)     Head Circumference --      Peak Flow --      Pain Score Jul 10, 2017 1206 0     Pain Loc --      Pain Edu? --      Excl. in GC? --     Constitutional: Eyes closed, opens eyes to name and looks at me, not answering questions, localizes to pain. HEENT:      Head: Normocephalic and atraumatic.         Eyes: Conjunctivae are normal. Sclera is non-icteric.       Mouth/Throat: Mucous membranes are moist.       Neck: Supple with no signs of meningismus. Cardiovascular: Regular rate and rhythm. No murmurs, gallops, or rubs. 2+ symmetrical distal pulses are present in all extremities. No JVD. Respiratory: increased work of breathing, satting 100% on 6 L, patient has significant crackles on bilateral lung fields, no wheezes. Gastrointestinal: Soft, non tender, and non distended with positive bowel sounds. No rebound or guarding. Genitourinary: No CVA tenderness. Musculoskeletal: 2 + pitting edema on b/l LE Neurologic: GCS 9 Skin: Skin is warm, dry and intact. No  rash noted.   ____________________________________________   LABS (all labs ordered are listed, but only abnormal results are displayed)  Labs Reviewed  CBC WITH DIFFERENTIAL/PLATELET - Abnormal; Notable for the following components:      Result Value   RBC 3.78 (*)    Hemoglobin 10.9 (*)    MCHC 31.2 (*)    RDW 16.5 (*)    Platelets 101 (*)    Lymphs Abs 0.7 (*)    All other components within normal limits  BASIC METABOLIC PANEL -  Abnormal; Notable for the following components:   Chloride 86 (*)    CO2 46 (*)    Glucose, Bld 136 (*)    BUN 23 (*)    Creatinine, Ser 1.31 (*)    GFR calc non Af Amer 37 (*)    GFR calc Af Amer 43 (*)    All other components within normal limits  BLOOD GAS, VENOUS - Abnormal; Notable for the following components:   pCO2, Ven 120 (*)    pO2, Ven 59.0 (*)    All other components within normal limits  TROPONIN I   ____________________________________________  EKG  ED ECG REPORT I, Nita Sicklearolina Mele Sylvester, the attending physician, personally viewed and interpreted this ECG.  Normal sinus rhythm, rate of 61, normal intervals, normal axis, no ST elevations or depressions. unchanged from prior from November 2018 ____________________________________________  RADIOLOGY  CXR: Findings are consistent with pulmonary edema. Bilateral pleural effusions with underlying opacities, right greater than left. The findings are similar in the interval.  ____________________________________________   PROCEDURES  Procedure(s) performed:yes Procedure Name: Intubation Date/Time: 06/08/2017 1:08 PM Performed by: Nita SickleVeronese, [place], MD Pre-anesthesia Checklist: Patient identified, Patient being monitored, Emergency Drugs available, Timeout performed and Suction available Oxygen Delivery Method: Ambu bag Preoxygenation: Pre-oxygenation with 100% oxygen Induction Type: Rapid sequence Ventilation: Mask ventilation without difficulty Laryngoscope Size:  Glidescope and 3 Tube size: 7.5 mm Number of attempts: 1 Placement Confirmation: ETT inserted through vocal cords under direct vision,  CO2 detector and Breath sounds checked- equal and bilateral Secured at: 22 cm Tube secured with: ETT holder Dental Injury: Teeth and Oropharynx as per pre-operative assessment       Critical Care performed: yes  CRITICAL CARE Performed by: Nita Sicklearolina Del Wiseman  ?  Total critical care time: 50 min  Critical care time was exclusive of separately billable procedures and treating other patients.  Critical care was necessary to treat or prevent imminent or life-threatening deterioration.  Critical care was time spent personally by me on the following activities: development of treatment plan with patient and/or surrogate as well as nursing, discussions with consultants, evaluation of patient's response to treatment, examination of patient, obtaining history from patient or surrogate, ordering and performing treatments and interventions, ordering and review of laboratory studies, ordering and review of radiographic studies, pulse oximetry and re-evaluation of patient's condition. ____________________________________________   INITIAL IMPRESSION / ASSESSMENT AND PLAN / ED COURSE  83 y.o. female with h/o HFpEF, COPD, and hypertension who presents for evaluation of shortness of breath x 1 week. Patient A&O per EMS however arrives in the ED with GCS9, increased WOB, volume overloaded on exam with crackles, pitting edema. Patient given lasix 60mg  IV and started on duoneb x 3. VBG showing pCO2 120 however pH 7.26. GCS deteriorated, family at the bedside confirmed patient was full code. Patient was intubated for airway protection with etomidate and succinylcholine per procedure note above. Patient was started on propofol for sedation. Chest x-ray confirms pulmonary edema for which she received 60 mg of Lasix. Patient will be admitted to ICU    As part of my medical  decision making, I reviewed the following data within the electronic MEDICAL RECORD NUMBER Nursing notes reviewed and incorporated, Labs reviewed , EKG interpreted , Old EKG reviewed, Old chart reviewed, Radiograph reviewed , Discussed with admitting physician , Notes from prior ED visits and [place] Controlled Substance Database    Pertinent labs & imaging results that were available during my care of the patient were reviewed by me  and considered in my medical decision making (see chart for details).    ____________________________________________   FINAL CLINICAL IMPRESSION(S) / ED DIAGNOSES  Final diagnoses:  Acute respiratory failure with hypoxia and hypercapnia (HCC)  Acute on chronic congestive heart failure, unspecified heart failure type (HCC)  COPD exacerbation (HCC)      NEW MEDICATIONS STARTED DURING THIS VISIT:  ED Discharge Orders    None       Note:  This document was prepared using Dragon voice recognition software and may include unintentional dictation errors.    Don Perking, [place], MD 07/02/2017 1310

## 2017-06-22 NOTE — Progress Notes (Signed)
Pulled back ET tube to 21cm at the lip per MD request.

## 2017-06-22 NOTE — ED Notes (Signed)
RT called to place pt on bipap.

## 2017-06-22 NOTE — ED Notes (Signed)
VO to start propofol at 40mg 

## 2017-06-23 ENCOUNTER — Inpatient Hospital Stay: Payer: Medicare Other

## 2017-06-23 DIAGNOSIS — E873 Alkalosis: Secondary | ICD-10-CM

## 2017-06-23 LAB — CBC
HCT: 35.3 % (ref 35.0–47.0)
Hemoglobin: 11.1 g/dL — ABNORMAL LOW (ref 12.0–16.0)
MCH: 28.6 pg (ref 26.0–34.0)
MCHC: 31.3 g/dL — AB (ref 32.0–36.0)
MCV: 91.3 fL (ref 80.0–100.0)
Platelets: 121 10*3/uL — ABNORMAL LOW (ref 150–440)
RBC: 3.87 MIL/uL (ref 3.80–5.20)
RDW: 16.2 % — AB (ref 11.5–14.5)
WBC: 14.4 10*3/uL — ABNORMAL HIGH (ref 3.6–11.0)

## 2017-06-23 LAB — URINALYSIS, COMPLETE (UACMP) WITH MICROSCOPIC
Bacteria, UA: NONE SEEN
Bilirubin Urine: NEGATIVE
GLUCOSE, UA: NEGATIVE mg/dL
Hgb urine dipstick: NEGATIVE
Ketones, ur: NEGATIVE mg/dL
Leukocytes, UA: NEGATIVE
Nitrite: NEGATIVE
PH: 8 (ref 5.0–8.0)
Protein, ur: NEGATIVE mg/dL
SPECIFIC GRAVITY, URINE: 1.006 (ref 1.005–1.030)
Squamous Epithelial / LPF: NONE SEEN

## 2017-06-23 LAB — BASIC METABOLIC PANEL
Anion gap: 11 (ref 5–15)
BUN: 24 mg/dL — AB (ref 6–20)
CALCIUM: 9.1 mg/dL (ref 8.9–10.3)
CO2: 44 mmol/L — ABNORMAL HIGH (ref 22–32)
CREATININE: 1.58 mg/dL — AB (ref 0.44–1.00)
Chloride: 85 mmol/L — ABNORMAL LOW (ref 101–111)
GFR calc Af Amer: 34 mL/min — ABNORMAL LOW (ref >60)
GFR, EST NON AFRICAN AMERICAN: 29 mL/min — AB (ref >60)
GLUCOSE: 174 mg/dL — AB (ref 65–99)
Potassium: 3.7 mmol/L (ref 3.5–5.1)
Sodium: 140 mmol/L (ref 135–145)

## 2017-06-23 LAB — MRSA PCR SCREENING: MRSA by PCR: NEGATIVE

## 2017-06-23 LAB — INFLUENZA PANEL BY PCR (TYPE A & B)
Influenza A By PCR: NEGATIVE
Influenza B By PCR: NEGATIVE

## 2017-06-23 LAB — GLUCOSE, CAPILLARY
GLUCOSE-CAPILLARY: 114 mg/dL — AB (ref 65–99)
GLUCOSE-CAPILLARY: 134 mg/dL — AB (ref 65–99)
GLUCOSE-CAPILLARY: 152 mg/dL — AB (ref 65–99)
GLUCOSE-CAPILLARY: 158 mg/dL — AB (ref 65–99)
GLUCOSE-CAPILLARY: 177 mg/dL — AB (ref 65–99)
GLUCOSE-CAPILLARY: 201 mg/dL — AB (ref 65–99)

## 2017-06-23 LAB — PROCALCITONIN: Procalcitonin: <0.1 ng/mL

## 2017-06-23 LAB — TROPONIN I: TROPONIN I: 0.07 ng/mL — AB (ref <0.03)

## 2017-06-23 MED ORDER — POTASSIUM CHLORIDE 20 MEQ/15ML (10%) PO SOLN
40.0000 meq | Freq: Once | ORAL | Status: AC
  Administered 2017-06-23: 40 meq
  Filled 2017-06-23: qty 30

## 2017-06-23 MED ORDER — INSULIN GLARGINE 100 UNIT/ML ~~LOC~~ SOLN
10.0000 [IU] | Freq: Every day | SUBCUTANEOUS | Status: DC
  Administered 2017-06-23 – 2017-06-30 (×8): 10 [IU] via SUBCUTANEOUS
  Filled 2017-06-23 (×9): qty 0.1

## 2017-06-23 MED ORDER — CHLORHEXIDINE GLUCONATE 0.12% ORAL RINSE (MEDLINE KIT)
15.0000 mL | Freq: Two times a day (BID) | OROMUCOSAL | Status: DC
  Administered 2017-06-23 – 2017-06-30 (×15): 15 mL via OROMUCOSAL

## 2017-06-23 MED ORDER — METHYLPREDNISOLONE SODIUM SUCC 40 MG IJ SOLR
40.0000 mg | Freq: Two times a day (BID) | INTRAMUSCULAR | Status: DC
  Administered 2017-06-23 – 2017-06-24 (×3): 40 mg via INTRAVENOUS
  Filled 2017-06-23 (×3): qty 1

## 2017-06-23 MED ORDER — PRO-STAT SUGAR FREE PO LIQD
30.0000 mL | Freq: Two times a day (BID) | ORAL | Status: DC
  Administered 2017-06-23 – 2017-06-24 (×3): 30 mL

## 2017-06-23 MED ORDER — ORAL CARE MOUTH RINSE
15.0000 mL | Freq: Four times a day (QID) | OROMUCOSAL | Status: DC
  Administered 2017-06-23 – 2017-06-27 (×18): 15 mL via OROMUCOSAL

## 2017-06-23 MED ORDER — HEPARIN SODIUM (PORCINE) 5000 UNIT/ML IJ SOLN
5000.0000 [IU] | Freq: Three times a day (TID) | INTRAMUSCULAR | Status: DC
  Administered 2017-06-23 – 2017-06-26 (×8): 5000 [IU] via SUBCUTANEOUS
  Filled 2017-06-23 (×8): qty 1

## 2017-06-23 MED ORDER — ACETAZOLAMIDE SODIUM 500 MG IJ SOLR
250.0000 mg | Freq: Once | INTRAMUSCULAR | Status: AC
  Administered 2017-06-23: 250 mg via INTRAVENOUS
  Filled 2017-06-23: qty 500

## 2017-06-23 MED ORDER — ASPIRIN 81 MG PO CHEW
81.0000 mg | CHEWABLE_TABLET | Freq: Every day | ORAL | Status: DC
  Administered 2017-06-23 – 2017-06-30 (×8): 81 mg
  Filled 2017-06-23 (×8): qty 1

## 2017-06-23 MED ORDER — VITAL HIGH PROTEIN PO LIQD
1000.0000 mL | ORAL | Status: DC
  Administered 2017-06-23: 1000 mL

## 2017-06-23 NOTE — Progress Notes (Signed)
Temp of 96.2.  Informed Annabelle Harmanana, NP.  Applied warming blanket.

## 2017-06-23 NOTE — Progress Notes (Signed)
Recheck of temp reveals 103 degrees.  Stopped warming blanket, Informed Dana, NP and administered Tylenol Will continue to monitor

## 2017-06-23 NOTE — Progress Notes (Signed)
Recheck of temp reveals drop of 0.9 degrees to 102.1.  Annabelle Harmanana, NP informed.  Cooling blanket ordered.  Will continue to monitor.

## 2017-06-23 NOTE — Consult Note (Signed)
PULMONARY / CRITICAL CARE MEDICINE   Name: Dana [name] MRN: [id number] DOB: 05/12/1933    ADMISSION DATE:  06/07/2017 CONSULTATION DATE: 06/11/2017  REFERRING MD:  Amado CoeGouru  PT PROFILE: [age] F with end-stage COPD intubated in the ED for acute/chronic hypoxemic/hypercarbic respiratory failure and hypersomnolence.  Recently discharged from [facility] after hospitalization for same.  During that hospitalization, did not require intubation but did require noninvasive ventilation for a couple of days.  MAJOR EVENTS/TEST RESULTS: 12/24 Admitted 12/25 Mild hypotension due to sedation. Low dose NE initiated. Pulmonary exam improved - weaning in PSV mode initiated  INDWELLING DEVICES:: ETT 12/24 >>   MICRO DATA: Urine 12/24  >>  Resp 12/24 >>  Blood 12/24 >>   ANTIMICROBIALS:  Doxycycline 12/24 >>      SUBJECTIVE:  RASS -4 on propofol. Not F/C  VITAL SIGNS: BP (!) 113/50   Pulse 76   Temp 97.9 F (36.6 C) (Rectal)   Resp (!) 22   Ht 4\' 9"  (1.448 m)   Wt 72.7 kg (160 lb 4.4 oz)   SpO2 94%   BMI 34.68 kg/m   HEMODYNAMICS:    VENTILATOR SETTINGS: Vent Mode: Spontaneous FiO2 (%):  [30 %-40 %] 30 % Set Rate:  [15 bmp-23 bmp] 15 bmp Vt Set:  [450 mL] 450 mL PEEP:  [5 cmH20-52 cmH20] 5 cmH20 Pressure Support:  [16 cmH20] 16 cmH20 Plateau Pressure:  [26 cmH20] 26 cmH20  INTAKE / OUTPUT: I/O last 3 completed shifts: In: 2018.7 [I.V.:1518.7; IV Piggyback:500] Out: 1400 [Urine:1400]  PHYSICAL EXAMINATION: General: Intubated, sedated Neuro: CNs intact, moves all extremities HEENT: NCAT, sclerae white Cardiovascular: Reg, no M noted Lungs: minimal scattered wheezes Abdomen: Obese, diminished bowel sounds, no palpable masses Extremities: Improved symmetric edema   LABS:  BMET Recent Labs  Lab 05/31/2017 1215 06/23/17 0409  NA 142 140  K 3.9 3.7  CL 86* 85*  CO2 46* 44*  BUN 23* 24*  CREATININE 1.31* 1.58*  GLUCOSE 136* 174*    Electrolytes Recent Labs  Lab  06/02/2017 1215 06/23/17 0409  CALCIUM 9.2 9.1    CBC Recent Labs  Lab 06/10/2017 1215 06/23/17 0409  WBC 7.7 14.4*  HGB 10.9* 11.1*  HCT 35.0 35.3  PLT 101* 121*    Coag's No results for input(s): APTT, INR in the last 168 hours.  Sepsis Markers Recent Labs  Lab 06/23/17 0028  PROCALCITON <0.10    ABG Recent Labs  Lab 06/11/2017 1406  PHART 7.46*  PCO2ART 80*  PO2ART 68*    Liver Enzymes No results for input(s): AST, ALT, ALKPHOS, BILITOT, ALBUMIN in the last 168 hours.  Cardiac Enzymes Recent Labs  Lab 05/30/2017 1626 06/13/2017 2232 06/23/17 0409  TROPONINI <0.03 0.04* 0.07*    Glucose Recent Labs  Lab 06/10/2017 1941 06/04/2017 2350 06/23/17 0354 06/23/17 0746 06/23/17 1129  GLUCAP 257* 112* 177* 201* 114*    CXR: Increased R pleural effusion and/or atx     ASSESSMENT / PLAN:  PULMONARY A: Acute/chronic hypercarbic/hypoxemic respiratory failure End-stage COPD with acute exacerbation Moderate R pleural effusion P:   Cont vent support - settings reviewed and/or adjusted PSV weaning as tolerated Cont vent bundle Daily SBT if/when meets criteria Nebulized steroids and bronchodilators Systemic steroids  CARDIOVASCULAR A:  History of diastolic heart failure PRobable pulmonary hypertension Hypotension due to sedation P:  Continue norepinephrine to maintain MAP >60 mmHg   RENAL A:   Severe metabolic alkalosis due to chronic hypercarbic respiratory failure and loop diuresis Hypervolemia  P:   Monitor BMET intermittently Monitor I/Os Correct electrolytes as indicated  Cont IVFs to help correct metabolic alkalosis Repeat acetazolamide X 1 dose 12/25  GASTROINTESTINAL A:   Obesity P:   SUP: IV famotidine TF protocol initiated 12/25  HEMATOLOGIC A:   Chronic anemia Mild thrombocytopenia P:  DVT px: SQ heparin Monitor CBC intermittently Transfuse per usual guidelines   INFECTIOUS A:   COPD exacerbation P:   Monitor temp,  WBC count Micro and abx as above   ENDOCRINE A:   Mild hyperglycemia without dx of DM P:   SSI while on systemic steroids Lantus initiated 12/25  NEUROLOGIC A:   Acute encephalopathy due to severe hypercarbia ICU/ventilator associated discomfort Poor baseline functional status P:   RASS goal: -1, -2 PAD protocol -propofol, intermittent fentanyl Palliative Care consultation requested 12/25   FAMILY  - Updates: Daughter updated at bedside    CCM time: 35 mins The above time includes time spent in consultation with patient and/or family members and reviewing care plan on multidisciplinary rounds  Billy Fischeravid Quincy Boy, MD PCCM service Mobile [phone] Pager [phone] 06/23/2017, 1:35 PM

## 2017-06-23 NOTE — Progress Notes (Signed)
Another recheck of temp reveals 101.3.  Cooling blanket at standby.  Temp probe inserted.  Will continue to monitor.

## 2017-06-23 NOTE — Progress Notes (Signed)
RN spoke with Dr. Sung AmabileSimonds and asked about echocardiogram.  Patient had echo 05-29-17.  MD gave order to d/c current echo that is ordered.

## 2017-06-23 NOTE — Progress Notes (Signed)
[place] at [facility]                                                                                                                                                                                  Patient Demographics   Dana [name], is a 83 y.o. female, DOB - 06-21-1933, [place] date - 06/01/2017   Admitting Physician Nicholes Mango, MD  Outpatient Primary MD for the patient is Jene Every, MD   LOS - 1  Subjective: Pt remains intubated    Review of Systems:   CONSTITUTIONAL: Intubated  Vitals:   Vitals:   06/23/17 1300 06/23/17 1315 06/23/17 1330 06/23/17 1345  BP: (!) 100/54 125/69 (!) 132/98 (!) 127/108  Pulse: 78 81 83 90  Resp: 20 (!) 23 (!) 28 (!) 23  Temp: 99.1 F (37.3 C)     TempSrc: Rectal     SpO2: 94% 93% 93% 92%  Weight:      Height:        Wt Readings from Last 3 Encounters:  06/29/2017 160 lb 4.4 oz (72.7 kg)  05/31/17 143 lb 14.4 oz (65.3 kg)  04/01/16 114 lb (51.7 kg)     Intake/Output Summary (Last 24 hours) at 06/23/2017 1411 Last data filed at 06/23/2017 1100 Gross per 24 hour  Intake 2464.59 ml  Output 1400 ml  Net 1064.59 ml    Physical Exam:   GENERAL: Intubated HEAD, EYES, EARS, NOSE AND THROAT: Atraumatic, normocephalic. Extraocular muscles are intact. Pupils equal and reactive to light. Sclerae anicteric. No conjunctival injection. No oro-pharyngeal erythema.  NECK: Supple. There is no jugular venous distention. No bruits, no lymphadenopathy, no thyromegaly.  HEART: Regular rate and rhythm,. No murmurs, no rubs, no clicks.  LUNGS: Decreased breath sounds on the ventilator ABDOMEN: Soft, flat, nontender, nondistended. Has good bowel sounds. No hepatosplenomegaly appreciated.  EXTREMITIES: No evidence of any cyanosis, clubbing, or peripheral edema.  +2 pedal and radial pulses bilaterally.  NEUROLOGIC:sedated SKIN: Moist and warm with no rashes appreciated.  Psych: Sedated  LN: No  inguinal LN enlargement    Antibiotics   Anti-infectives (From admission, onward)   Start     Dose/Rate Route Frequency Ordered Stop   06/08/2017 1630  doxycycline (VIBRAMYCIN) 100 mg in dextrose 5 % 250 mL IVPB     100 mg 125 mL/hr over 120 Minutes Intravenous Every 12 hours 06/03/2017 1501        Medications   Scheduled Meds: . amiodarone  200 mg Per Tube Daily  . aspirin  81 mg Per Tube Daily  . budesonide (PULMICORT) nebulizer solution  0.25 mg Nebulization Q6H  . chlorhexidine gluconate (MEDLINE  KIT)  15 mL Mouth Rinse BID  . feeding supplement (PRO-STAT SUGAR FREE 64)  30 mL Per Tube BID  . feeding supplement (VITAL HIGH PROTEIN)  1,000 mL Per Tube Q24H  . heparin injection (subcutaneous)  5,000 Units Subcutaneous Q8H  . insulin aspart  0-15 Units Subcutaneous Q4H  . insulin glargine  10 Units Subcutaneous Daily  . ipratropium-albuterol  3 mL Nebulization Q6H  . mouth rinse  15 mL Mouth Rinse QID  . methylPREDNISolone (SOLU-MEDROL) injection  40 mg Intravenous Q12H  . sodium chloride flush  3 mL Intravenous Q12H   Continuous Infusions: . sodium chloride    . 0.9 % NaCl with KCl 40 mEq / L 50 mL/hr (06/19/2017 1710)  . doxycycline (VIBRAMYCIN) IV Stopped (06/23/17 0558)  . famotidine (PEPCID) IV Stopped (06/20/2017 1741)  . norepinephrine (LEVOPHED) Adult infusion 4 mcg/min (06/23/17 1351)  . propofol (DIPRIVAN) infusion 25 mcg/kg/min (06/23/17 1231)   PRN Meds:.sodium chloride, acetaminophen, albuterol, fentaNYL (SUBLIMAZE) injection, ondansetron (ZOFRAN) IV, sodium chloride flush   Data Review:   Micro Results Recent Results (from the past 240 hour(s))  Culture, respiratory (NON-Expectorated)     Status: None (Preliminary result)   Collection Time: 06/02/2017  2:56 PM  Result Value Ref Range Status   Specimen Description   Final    TRACHEAL ASPIRATE Performed at [facility], [address], [place], [place] [address]    Special Requests   Final     NONE Performed at [facility], [address] [place], [place] [address]    Gram Stain   Final    ABUNDANT WBC PRESENT,BOTH PMN AND MONONUCLEAR RARE GRAM POSITIVE COCCI    Culture   Final    CULTURE REINCUBATED FOR BETTER GROWTH Performed at [facility], [place] [address], [place], [place] [address]    Report Status PENDING  Incomplete  CULTURE, BLOOD (ROUTINE X 2) w Reflex to ID Panel     Status: None (Preliminary result)   Collection Time: 06/23/17 12:28 AM  Result Value Ref Range Status   Specimen Description BLOOD LEFT FOREARM  Final   Special Requests   Final    BOTTLES DRAWN AEROBIC AND ANAEROBIC Blood Culture adequate volume   Culture   Final    NO GROWTH < 12 HOURS Performed at [facility], [address] [place], [place] [address]    Report Status PENDING  Incomplete  CULTURE, BLOOD (ROUTINE X 2) w Reflex to ID Panel     Status: None (Preliminary result)   Collection Time: 06/23/17 12:36 AM  Result Value Ref Range Status   Specimen Description BLOOD LEFT HAND  Final   Special Requests   Final    BOTTLES DRAWN AEROBIC AND ANAEROBIC Blood Culture adequate volume   Culture   Final    NO GROWTH < 12 HOURS Performed at [facility], [address] [place], [place] [address]    Report Status PENDING  Incomplete    Radiology Reports Dg Chest 2 View  Result Date: 05/26/2017 CLINICAL DATA:  Shortness of breath. Follow-up opacity overlying the medial left upper lung. EXAM: CHEST  2 VIEW COMPARISON:  05/26/2017 and prior exams FINDINGS: Upper limits normal heart size, small bilateral pleural effusions and bilateral lower lung atelectasis/ airspace disease again noted. Interstitial prominence is again identified. A linear opacity measuring up to 2 cm overlies the medial left upper lung. This is of uncertain etiology but could represent atelectasis. Recommend chest radiographic follow-up in  6-8 weeks. IMPRESSION: Linear opacity overlying the  medial left upper lung, of uncertain etiology but could represent atelectasis. Chest radiographic follow-up in 6-8 weeks recommended. Punch small bilateral pleural effusions, bilateral lower lung atelectasis/ airspace disease and possible mild interstitial edema. Electronically Signed   By: Margarette Canada M.D.   On: 05/26/2017 20:27   US Venous Img Lower Bilateral  Result Date: 05/27/2017 CLINICAL DATA:  Bilateral lower extremity pain and edema for the past several weeks. History of smoking and varicose veins. History of malignancy. Evaluate for DVT. EXAM: BILATERAL LOWER EXTREMITY VENOUS DOPPLER ULTRASOUND TECHNIQUE: Gray-scale sonography with graded compression, as well as color Doppler and duplex ultrasound were performed to evaluate the lower extremity deep venous systems from the level of the common femoral vein and including the common femoral, femoral, profunda femoral, popliteal and calf veins including the posterior tibial, peroneal and gastrocnemius veins when visible. The superficial great saphenous vein was also interrogated. Spectral Doppler was utilized to evaluate flow at rest and with distal augmentation maneuvers in the common femoral, femoral and popliteal veins. COMPARISON:  None. FINDINGS: RIGHT LOWER EXTREMITY Common Femoral Vein: No evidence of thrombus. Normal compressibility, respiratory phasicity and response to augmentation. Saphenofemoral Junction: No evidence of thrombus. Normal compressibility and flow on color Doppler imaging. Profunda Femoral Vein: No evidence of thrombus. Normal compressibility and flow on color Doppler imaging. Femoral Vein: No evidence of thrombus. Normal compressibility, respiratory phasicity and response to augmentation. Popliteal Vein: No evidence of thrombus. Normal compressibility, respiratory phasicity and response to augmentation. Calf Veins: No evidence of thrombus. Normal compressibility and flow on color Doppler imaging. Superficial Great Saphenous Vein:  No evidence of thrombus. Normal compressibility. Venous Reflux:  None. Other Findings:  None. LEFT LOWER EXTREMITY Common Femoral Vein: No evidence of thrombus. Normal compressibility, respiratory phasicity and response to augmentation. Saphenofemoral Junction: No evidence of thrombus. Normal compressibility and flow on color Doppler imaging. Profunda Femoral Vein: No evidence of thrombus. Normal compressibility and flow on color Doppler imaging. Femoral Vein: No evidence of thrombus. Normal compressibility, respiratory phasicity and response to augmentation. Popliteal Vein: No evidence of thrombus. Normal compressibility, respiratory phasicity and response to augmentation. Calf Veins: No evidence of thrombus. Normal compressibility and flow on color Doppler imaging. Superficial Great Saphenous Vein: No evidence of thrombus. Normal compressibility. Venous Reflux:  None. Other Findings:  None. IMPRESSION: No evidence of DVT within either lower extremity. Electronically Signed   By: Sandi Mariscal M.D.   On: 05/27/2017 13:54   Dg Chest Port 1 View  Result Date: 06/23/2017 CLINICAL DATA:  Respiratory failure EXAM: PORTABLE CHEST 1 VIEW COMPARISON:  Chest radiograph 06/20/2017 FINDINGS: Worsened aeration of the right lung base, with possible veiling pleural effusion. The endotracheal tube has been retracted and is now in satisfactory position at the level of the clavicular heads. Orogastric tube side port is in the stomach. There is calcific aortic arch atherosclerosis. Cardiomediastinal contours are unchanged. Diffuse interstitial prominence remains. IMPRESSION: 1. Satisfactory position of endotracheal tube. 2. Worsening aeration of the right lung base with possible veiling pleural effusion. Electronically Signed   By: Ulyses Jarred M.D.   On: 06/23/2017 04:56   Dg Chest Portable 1 View  Result Date: 06/11/2017 CLINICAL DATA:  Intubation EXAM: PORTABLE CHEST 1 VIEW COMPARISON:  June 22, 2017 FINDINGS: The  ETT terminates 17 mm above the carina. An OG tube terminates below today's film. Improving mild edema. Right greater than left pleural effusions with underlying opacities remain, unchanged. IMPRESSION: 1. The ETT  terminates 17 mm above the carina. Recommend withdrawing 1 cm. 2. Improving edema. 3. Right greater than left pleural effusions with underlying opacities, similar in the interval. Electronically Signed   By: Dorise Bullion III M.D   On: 06/15/2017 13:34   Dg Chest Portable 1 View  Result Date: 06/09/2017 CLINICAL DATA:  Shortness of breath. EXAM: PORTABLE CHEST 1 VIEW COMPARISON:  May 29, 2017 FINDINGS: Cardiomegaly. Right greater than left pleural effusions with underlying opacities. Diffuse interstitial opacity. No other interval change. IMPRESSION: Findings are consistent with pulmonary edema. Bilateral pleural effusions with underlying opacities, right greater than left. The findings are similar in the interval. Electronically Signed   By: Dorise Bullion III M.D   On: 06/14/2017 12:36   Dg Chest Port 1 View  Result Date: 05/29/2017 CLINICAL DATA:  Respiratory failure. EXAM: PORTABLE CHEST 1 VIEW COMPARISON:  05/28/2017. FINDINGS: Mediastinum hilar structures are normal. Cardiomegaly with bilateral from interstitial prominence and bilateral pleural effusions consistent CHF. No change from prior exam. IMPRESSION: Congestive heart failure pulmonary interstitial edema and bilateral pleural effusions. No significant change from prior exam. Electronically Signed   By: Marcello Moores  Register   On: 05/29/2017 09:26   Dg Chest Port 1 View  Result Date: 05/28/2017 CLINICAL DATA:  Respiratory failure EXAM: PORTABLE CHEST 1 VIEW COMPARISON:  05/26/2017 FINDINGS: Progression of heart failure with edema. Progression of bilateral effusions and bibasilar atelectasis. Cardiac enlargement. Left upper lobe linear density has improved. IMPRESSION: Progressive heart failure and edema. Progression of  bilateral pleural effusions. Electronically Signed   By: Franchot Gallo M.D.   On: 05/28/2017 08:23   Dg Chest Portable 1 View  Result Date: 05/26/2017 CLINICAL DATA:  Difficulty breathing.  History of CHF. EXAM: PORTABLE CHEST 1 VIEW COMPARISON:  05/30/2016 and 03/29/2016 FINDINGS: Interstitial markings are prominent and raise concern for edema. There are densities at both lung bases which could represent volume loss and pleural fluid. Heart size appears to be upper limits of normal. Question increased densities along the medial left upper lung. Atherosclerotic calcifications at the aortic arch. Negative for a pneumothorax. IMPRESSION: Bibasilar densities are concerning for pleural fluid and basilar atelectasis. Prominent interstitial markings could represent mild edema. Questionable densities along the medial left upper lung. Recommend follow-up two view examination to better characterize this area. Electronically Signed   By: Markus Daft M.D.   On: 05/26/2017 16:47   Dg Abd Portable 1 View  Result Date: 06/02/2017 CLINICAL DATA:  Orogastric tube placement EXAM: PORTABLE ABDOMEN - 1 VIEW COMPARISON:  February 16, 2015 FINDINGS: Orogastric tube tip and side port are in the stomach. There is moderate stool throughout the colon. There is no bowel dilatation or air-fluid level to suggest bowel obstruction. No free air. There is aortic atherosclerosis. There is a small left pleural effusion. IMPRESSION: Orogastric tube tip and side port in stomach. No bowel obstruction or free air. Moderate stool throughout colon. Small left pleural effusion. There is aortic atherosclerosis. Aortic Atherosclerosis (ICD10-I70.0). Electronically Signed   By: Lowella Grip III M.D.   On: 06/04/2017 13:33     CBC Recent Labs  Lab 06/25/2017 1215 06/23/17 0409  WBC 7.7 14.4*  HGB 10.9* 11.1*  HCT 35.0 35.3  PLT 101* 121*  MCV 92.6 91.3  MCH 28.9 28.6  MCHC 31.2* 31.3*  RDW 16.5* 16.2*  LYMPHSABS 0.7*  --   MONOABS  0.8  --   EOSABS 0.1  --   BASOSABS 0.0  --     Chemistries  Recent Labs  Lab 06/09/2017 1215 06/23/17 0409  NA 142 140  K 3.9 3.7  CL 86* 85*  CO2 46* 44*  GLUCOSE 136* 174*  BUN 23* 24*  CREATININE 1.31* 1.58*  CALCIUM 9.2 9.1   ------------------------------------------------------------------------------------------------------------------ estimated creatinine clearance is 22.6 mL/min (A) (by C-G formula based on SCr of 1.58 mg/dL (H)). ------------------------------------------------------------------------------------------------------------------ No results for input(s): HGBA1C in the last 72 hours. ------------------------------------------------------------------------------------------------------------------ Recent Labs    06/24/2017 1215  TRIG 53   ------------------------------------------------------------------------------------------------------------------ No results for input(s): TSH, T4TOTAL, T3FREE, THYROIDAB in the last 72 hours.  Invalid input(s): FREET3 ------------------------------------------------------------------------------------------------------------------ No results for input(s): VITAMINB12, FOLATE, FERRITIN, TIBC, IRON, RETICCTPCT in the last 72 hours.  Coagulation profile No results for input(s): INR, PROTIME in the last 168 hours.  No results for input(s): DDIMER in the last 72 hours.  Cardiac Enzymes Recent Labs  Lab 06/04/2017 1626 06/07/2017 2232 06/23/17 0409  TROPONINI <0.03 0.04* 0.07*   ------------------------------------------------------------------------------------------------------------------ Invalid input(s): POCBNP    Assessment & Plan   Dana [name]  is a 83 y.o. female with a known history of chronic COPD, hypertension, chronic congestive heart failure on loop diuretics is brought into the ED via EMS for severe shortness of breath. Patient was waiting to see her primary care physician but as her shortness of  breath has been gotten worse family decided to call EMS and patient is brought into the ED. Patient chronically lives on 3 L of oxygen but patient was extremity hypoxemic at 70% with a significant rales and wheezes. Patient Glasgow Coma Scale was at 9 by the time of arrival  #Acute encephalopathy secondary to severe hypercarbia Admitted to intensive care unit Patient is intubated   #Acute hypoxic and hypercarbic respiratory failure secondary to COPD exacerbation and CHF exacerbation Continue ventilator Continue Solu-Medrol Continue Pulmicort IV doxycycline empiric  #Acute COPD exacerbation Solum Medrol and bronchodilator therapy  #Severe metabolic alkalosis due to chronic hypercarbic respiratory failure and loop diuretic use with history of acute on chronic diuresis  #possible chf - echo pending   #Hypotension On Levophed  #Thrombocytopenia No active bleeding or bruising        Code Status Orders  (From admission, onward)        Start     Ordered   06/19/2017 1611  Full code  Continuous     06/21/2017 1611    Code Status History    Date Active Date Inactive Code Status Order ID Comments User Context   05/26/2017 20:34 06/01/2017 18:21 Full Code [id number]  Harrie Foreman, MD Inpatient   03/24/2016 16:47 04/01/2016 20:42 Full Code [id number]  de Dios, Mike Gip, MD Inpatient           Consults  intensivitst  DVT Prophylaxis  scd's  Lab Results  Component Value Date   PLT 121 (L) 06/23/2017     Time Spent in minutes 35 minutes greater than 50% of time spent in care coordination and counseling patient regarding the condition and plan of care.   Dustin Flock M.D on 06/23/2017 at 2:11 PM  Between 7am to 6pm - Pager - [phone]  After 6pm go to [email or web address] - password [email or web address] [facility]   Office  [phone]

## 2017-06-23 NOTE — Progress Notes (Signed)
Temp now WNL.  Will continue to monitor.

## 2017-06-24 ENCOUNTER — Inpatient Hospital Stay: Payer: Medicare Other

## 2017-06-24 DIAGNOSIS — Z7189 Other specified counseling: Secondary | ICD-10-CM

## 2017-06-24 DIAGNOSIS — Z515 Encounter for palliative care: Secondary | ICD-10-CM

## 2017-06-24 DIAGNOSIS — J9602 Acute respiratory failure with hypercapnia: Secondary | ICD-10-CM

## 2017-06-24 DIAGNOSIS — J151 Pneumonia due to Pseudomonas: Secondary | ICD-10-CM

## 2017-06-24 DIAGNOSIS — A419 Sepsis, unspecified organism: Secondary | ICD-10-CM

## 2017-06-24 DIAGNOSIS — R6520 Severe sepsis without septic shock: Secondary | ICD-10-CM

## 2017-06-24 DIAGNOSIS — I509 Heart failure, unspecified: Secondary | ICD-10-CM

## 2017-06-24 LAB — COMPREHENSIVE METABOLIC PANEL
ALT: 15 U/L (ref 14–54)
AST: 23 U/L (ref 15–41)
Albumin: 2.6 g/dL — ABNORMAL LOW (ref 3.5–5.0)
Alkaline Phosphatase: 58 U/L (ref 38–126)
Anion gap: 8 (ref 5–15)
BUN: 45 mg/dL — AB (ref 6–20)
CHLORIDE: 95 mmol/L — AB (ref 101–111)
CO2: 33 mmol/L — ABNORMAL HIGH (ref 22–32)
Calcium: 8.7 mg/dL — ABNORMAL LOW (ref 8.9–10.3)
Creatinine, Ser: 1.36 mg/dL — ABNORMAL HIGH (ref 0.44–1.00)
GFR, EST AFRICAN AMERICAN: 41 mL/min — AB (ref >60)
GFR, EST NON AFRICAN AMERICAN: 35 mL/min — AB (ref >60)
Glucose, Bld: 181 mg/dL — ABNORMAL HIGH (ref 65–99)
POTASSIUM: 3.9 mmol/L (ref 3.5–5.1)
SODIUM: 136 mmol/L (ref 135–145)
Total Bilirubin: 0.6 mg/dL (ref 0.3–1.2)
Total Protein: 5.6 g/dL — ABNORMAL LOW (ref 6.5–8.1)

## 2017-06-24 LAB — GLUCOSE, CAPILLARY
GLUCOSE-CAPILLARY: 183 mg/dL — AB (ref 65–99)
GLUCOSE-CAPILLARY: 191 mg/dL — AB (ref 65–99)
GLUCOSE-CAPILLARY: 164 mg/dL — AB (ref 65–99)
GLUCOSE-CAPILLARY: 157 mg/dL — AB (ref 65–99)
Glucose-Capillary: 137 mg/dL — ABNORMAL HIGH (ref 65–99)
Glucose-Capillary: 127 mg/dL — ABNORMAL HIGH (ref 65–99)
Glucose-Capillary: 138 mg/dL — ABNORMAL HIGH (ref 65–99)
Glucose-Capillary: 194 mg/dL — ABNORMAL HIGH (ref 65–99)

## 2017-06-24 LAB — CBC
HCT: 31.3 % — ABNORMAL LOW (ref 35.0–47.0)
Hemoglobin: 10.1 g/dL — ABNORMAL LOW (ref 12.0–16.0)
MCH: 29 pg (ref 26.0–34.0)
MCHC: 32.3 g/dL (ref 32.0–36.0)
MCV: 89.9 fL (ref 80.0–100.0)
PLATELETS: 104 10*3/uL — AB (ref 150–440)
RBC: 3.48 MIL/uL — ABNORMAL LOW (ref 3.80–5.20)
RDW: 16.1 % — AB (ref 11.5–14.5)
WBC: 22.5 10*3/uL — AB (ref 3.6–11.0)

## 2017-06-24 LAB — PROCALCITONIN: Procalcitonin: 1.23 ng/mL

## 2017-06-24 LAB — URINE CULTURE: CULTURE: NO GROWTH

## 2017-06-24 MED ORDER — DEXTROSE 5 % IV SOLN
1.0000 g | INTRAVENOUS | Status: DC
  Filled 2017-06-24: qty 1

## 2017-06-24 MED ORDER — VITAL HIGH PROTEIN PO LIQD
1000.0000 mL | ORAL | Status: DC
  Administered 2017-06-24 – 2017-06-28 (×5): 1000 mL

## 2017-06-24 MED ORDER — SODIUM CHLORIDE 0.9% FLUSH: 10.0000 mL | INTRAVENOUS | Status: DC | PRN

## 2017-06-24 MED ORDER — ADULT MULTIVITAMIN LIQUID CH
15.0000 mL | Freq: Every day | ORAL | Status: DC
  Administered 2017-06-24 – 2017-06-30 (×7): 15 mL
  Filled 2017-06-24 (×9): qty 15

## 2017-06-24 MED ORDER — DEXTROSE 5 % IV SOLN
2.0000 g | INTRAVENOUS | Status: DC
  Administered 2017-06-24 – 2017-06-25 (×2): 2 g via INTRAVENOUS
  Filled 2017-06-24 (×3): qty 2

## 2017-06-24 MED ORDER — SODIUM CHLORIDE 0.9% FLUSH
10.0000 mL | Freq: Two times a day (BID) | INTRAVENOUS | Status: DC
  Administered 2017-06-24 – 2017-06-30 (×12): 10 mL

## 2017-06-24 MED ORDER — LEVOFLOXACIN IN D5W 750 MG/150ML IV SOLN
750.0000 mg | Freq: Once | INTRAVENOUS | Status: AC
  Administered 2017-06-24: 750 mg via INTRAVENOUS
  Filled 2017-06-24: qty 150

## 2017-06-24 MED ORDER — DEXTROSE 5 % IV SOLN
0.0000 ug/min | INTRAVENOUS | Status: DC
  Administered 2017-06-24: 25 ug/min via INTRAVENOUS
  Filled 2017-06-24 (×2): qty 16

## 2017-06-24 MED ORDER — LEVOFLOXACIN IN D5W 750 MG/150ML IV SOLN: 750.0000 mg | INTRAVENOUS | Status: DC

## 2017-06-24 MED ORDER — PREDNISONE 20 MG PO TABS
40.0000 mg | ORAL_TABLET | Freq: Every day | ORAL | Status: DC
  Administered 2017-06-25: 40 mg
  Filled 2017-06-24: qty 2

## 2017-06-24 NOTE — Progress Notes (Addendum)
Initial Nutrition Assessment  DOCUMENTATION CODES:   Obesity unspecified  INTERVENTION:  Recommend Vital High Protein at 50 mL/hr (1200 mL goal daily volume). Provides 1200 kcal, 105 grams of protein, 1008 mL H2O daily. With current propofol rate provides 1361 kcal.  Will discontinue order for Pro-Stat.  Provides liquid MVI daily as goal TF regimen does not meet 100% RDIs.  NUTRITION DIAGNOSIS:   Inadequate oral intake related to inability to eat as evidenced by NPO status.  GOAL:   Provide needs based on ASPEN/SCCM guidelines  MONITOR:   Vent status, Weight trends, TF tolerance, Skin, I & O's  REASON FOR ASSESSMENT:   Ventilator, Consult Enteral/tube feeding initiation and management  ASSESSMENT:   83 year old female with PMHx of end-stage COPD, CHF, hx of hernia repair around [place] area who is admitted with acute exacerbation of COPD, moderate right pleural effusion, and was emergently intubated in ED for respiratory failure and hypersomnolence.   No family members at bedside during RD assessment. Patient is intubated and sedated. Patient appears volume-overloaded. Per chart she was 143.9 lbs on 05/31/2017 so current weight likely falsely elevated. However, even with weight from 12/2 patient still meets criteria for obesity.  Access: 18 Fr. OGT placed 12/24; tip and side port in stomach per abdominal x-ray 12/24; 60 cm at corner of mouth  MAP: 55-76 mmHg  Patient is currently intubated on ventilator support MV: 7.7 L/min Temp (24hrs), Avg:99.9 F (37.7 C), Min:98.8 F (37.1 C), Max:101.1 F (38.4 C)  Propofol: 6.1 ml/hr (161 kcal daily)  Medications reviewed and include: Novolog 0-15 units Q4hrs, Lantus 10 units daily, Solu-Medrol 40 mg Q12hrs IV, Levophed gtt, propofol gtt.  Labs reviewed: CBG 137-191, Chloride 95, CO2 33, BUN 45, Creatinine 1.36.  Discussed with RN and also discussed on rounds.  NUTRITION - FOCUSED PHYSICAL EXAM:    Most Recent Value   Orbital Region  No depletion  Upper Arm Region  No depletion  Thoracic and Lumbar Region  No depletion  Buccal Region  Unable to assess  Temple Region  Mild depletion  Clavicle Bone Region  No depletion  Clavicle and Acromion Bone Region  No depletion  Scapular Bone Region  No depletion  Dorsal Hand  No depletion  Patellar Region  Unable to assess  Anterior Thigh Region  Unable to assess  Posterior Calf Region  Unable to assess  Edema (RD Assessment)  Moderate  Hair  Reviewed  Eyes  Unable to assess  Mouth  Unable to assess  Skin  Reviewed  Nails  Reviewed     Diet Order:  No diet orders on file  EDUCATION NEEDS:   No education needs have been identified at this time  Skin:  Skin Assessment: Reviewed RN Assessment(ecchymosis bilateral arms and legs)  Last BM:  Unknown  Height:   Ht Readings from Last 1 Encounters:  Dec 25, 2016 4\' 9"  (1.448 m)    Weight:   Wt Readings from Last 1 Encounters:  Dec 25, 2016 160 lb 4.4 oz (72.7 kg)    Ideal Body Weight:  43.2 kg  BMI:  Body mass index is 34.68 kg/m.  Estimated Nutritional Needs:   Kcal:  [phone] (11-14 kcal/kg)  Protein:  >/= 86 grams (>/= 2 grams/kg IBW)  Fluid:  1.3 L/day (30 mL/kg IBW)  Helane RimaLeanne Biagio Snelson, MS, RD, LDN Office: [phone] Pager: [phone] After Hours/Weekend Pager: [phone]

## 2017-06-24 NOTE — Progress Notes (Signed)
Pharmacy Antibiotic Note  Dana [name] is a 83 y.o. female admitted on October 19, 2016 with end-stage COPD.  Pharmacy has been consulted for Levaquin dosing. Patient previously on doxycycline with respiratory culture now growing Pseudomonas.   Plan: Levaquin 750 mg iv q 48 hours.   Height: 4\' 9"  (144.8 cm) Weight: 160 lb 4.4 oz (72.7 kg) IBW/kg (Calculated) : 38.6  Temp (24hrs), Avg:99.9 F (37.7 C), Min:98.8 F (37.1 C), Max:101.1 F (38.4 C)  Recent Labs  Lab 02-27-17 1215 06/23/17 0409 06/24/17 0645  WBC 7.7 14.4* 22.5*  CREATININE 1.31* 1.58* 1.36*    Estimated Creatinine Clearance: 26.3 mL/min (A) (by C-G formula based on SCr of 1.36 mg/dL (H)).    No Known Allergies  Antimicrobials this admission: doxycycline 12/24 >> 12/26 Levaqin 12/26 >>   Dose adjustments this admission:   Microbiology results: 12/25 BCx: NGTD 12/25 UCx: negative  12/24 Sputum: Pseudomonas aeruginosa  12/25 MRSA PCR: negative  Thank you for allowing pharmacy to be a part of this patient's care.  Dana HartChristy, Dana [name] 06/24/2017 11:45 AM

## 2017-06-24 NOTE — Consult Note (Addendum)
                                                                                 Consultation Note Date: 06/24/2017   Patient Name: Dana [name]  DOB: 04/17/1933  MRN: [id number]  Age / Sex: 84 y.o., female  PCP: Gibson, David, MD Referring Physician: Konidena, Snehalatha, MD  Reason for Consultation: Establishing goals of care  HPI/Patient Profile: 83 F with end-stage COPD intubated in the ED for respiratory failure and hypersomnolence.  Recently discharged from [facility] after hospitalization for the same.  During prior hospitalization, did not require intubation but did require noninvasive ventilation for a couple of days.    Clinical Assessment and Goals of Care: Dana [name] is resting in bed on a ventilator. Daughter is at bedside. She states she has 1 sister, but she has been the decision maker for her mother. She states she lives with her mother. No POA papers. Per CCM, plans to look at withdrawal in the next few days, no later than next Monday.   Spoke with Dana [name] at bedside, and other sister via telephone.  We discussed diagnosis, prognosis, GOC, EOL wishes disposition and options.   The difference between an aggressive medical intervention path  and a hospice comfort care path for this patient was discussed.  Values and goals of care important to patient and family were discussed.  Dana [name] states she believes her mother would want cardiac resusitation if it were needed. Spoke with her sister via telephone. Detailed discussion regarding her mother's status, diagnosis, prognosis, and EOL wishes. Her sister believes her mother would not want cardiac resuscitative efforts. . Discussed prognosis and CPR. The daughters will speak this evening and make a decision this evening or in the morning regarding code status. Explained discussions of a time trial prior to extubation.   Will re-meet with daughter at 10:30 tomorrow morning.      SUMMARY OF RECOMMENDATIONS   Family to discuss  code status.  Meeting in the morning at 10:30.    Code Status/Advance Care Planning:  Full code    Symptom Management:   Per CCM.   Palliative Prophylaxis:   Oral Care    Prognosis:   Poor given COPD and CHF, ventilator support.   Discharge Planning: To Be Determined      Primary Diagnoses: Present on Admission: . Acute respiratory failure with hypoxia (HCC)   I have reviewed the medical record, interviewed the patient and family, and examined the patient. The following aspects are pertinent.  Past Medical History:  Diagnosis Date  . CHF (congestive heart failure) (HCC)   . COPD (chronic obstructive pulmonary disease) (HCC)    Social History   Socioeconomic History  . Marital status: Widowed    Spouse name: None  . Number of children: None  . Years of education: None  . Highest education level: None  Social Needs  . Financial resource strain: None  . Food insecurity - worry: None  . Food insecurity - inability: None  . Transportation needs - medical: None  . Transportation needs - non-medical: None  Occupational History  . None  Tobacco Use  . Smoking status: Current Every Day   Smoker    Packs/day: 1.00    Types: Cigarettes  . Smokeless tobacco: Never Used  Substance and Sexual Activity  . Alcohol use: No  . Drug use: No  . Sexual activity: None  Other Topics Concern  . None  Social History Narrative  . None   Family History  Problem Relation Age of Onset  . Cancer Mother   . COPD Father    Scheduled Meds: . amiodarone  200 mg Per Tube Daily  . aspirin  81 mg Per Tube Daily  . budesonide (PULMICORT) nebulizer solution  0.25 mg Nebulization Q6H  . chlorhexidine gluconate (MEDLINE KIT)  15 mL Mouth Rinse BID  . heparin injection (subcutaneous)  5,000 Units Subcutaneous Q8H  . insulin aspart  0-15 Units Subcutaneous Q4H  . insulin glargine  10 Units Subcutaneous Daily  . ipratropium-albuterol  3 mL Nebulization Q6H  . mouth rinse  15 mL  Mouth Rinse QID  . methylPREDNISolone (SOLU-MEDROL) injection  40 mg Intravenous Q12H  . multivitamin  15 mL Per Tube Daily  . sodium chloride flush  3 mL Intravenous Q12H   Continuous Infusions: . sodium chloride    . famotidine (PEPCID) IV Stopped (06/23/17 1725)  . feeding supplement (VITAL HIGH PROTEIN) 1,000 mL (06/24/17 1146)  . [START ON 06/26/2017] levofloxacin (LEVAQUIN) IV    . norepinephrine (LEVOPHED) Adult infusion 6 mcg/min (06/24/17 1237)  . propofol (DIPRIVAN) infusion 15 mcg/kg/min (06/24/17 1115)   PRN Meds:.sodium chloride, acetaminophen, albuterol, fentaNYL (SUBLIMAZE) injection, ondansetron (ZOFRAN) IV, sodium chloride flush Medications Prior to Admission:  Prior to Admission medications   Medication Sig Start Date End Date Taking? Authorizing Provider  acetaminophen (TYLENOL) 325 MG tablet Take 325-650 mg by mouth every 6 (six) hours as needed for headache.   Yes [provider]  albuterol (PROVENTIL HFA;VENTOLIN HFA) 108 (90 Base) MCG/ACT inhaler Inhale 2 puffs into the lungs every 6 (six) hours as needed for wheezing. 02/13/16 06/12/2017 Yes [provider]  amiodarone (PACERONE) 200 MG tablet Take 1 tablet (200 mg total) by mouth daily. 06/01/17  Yes Henreitta Leber, MD  aspirin 81 MG chewable tablet Chew 1 tablet (81 mg total) by mouth daily. 04/01/16  Yes Regalado, Belkys A, MD  atorvastatin (LIPITOR) 40 MG tablet Take 40 mg by mouth every evening.   Yes [provider]  budesonide (PULMICORT) 0.5 MG/2ML nebulizer solution Take 2 mLs (0.5 mg total) by nebulization 2 (two) times daily. 04/01/16  Yes Regalado, Belkys A, MD  feeding supplement, ENSURE ENLIVE, (ENSURE ENLIVE) LIQD Take 237 mLs by mouth 2 (two) times daily between meals. 04/01/16  Yes Regalado, Belkys A, MD  furosemide (LASIX) 20 MG tablet Take 60 mg by mouth.   Yes [provider]  gabapentin (NEURONTIN) 100 MG capsule Take 200 mg by mouth 3 (three) times daily.   Yes  [provider]  ipratropium-albuterol (DUONEB) 0.5-2.5 (3) MG/3ML SOLN Take 3 mLs by nebulization every 6 (six) hours. 04/01/16  Yes Regalado, Belkys A, MD  senna-docusate (SENOKOT-S) 8.6-50 MG tablet Take 1 tablet by mouth 2 (two) times daily. 04/01/16  Yes Regalado, Belkys A, MD  ergocalciferol (VITAMIN D2) 50000 units capsule Take 50,000 Units by mouth once a week.    [provider]  nicotine (NICODERM CQ - DOSED IN MG/24 HOURS) 21 mg/24hr patch Place 1 patch (21 mg total) onto the skin daily. Patient not taking: Reported on 05/26/2017 04/01/16   Elmarie Shiley, MD   No Known Allergies Review  of Systems  Unable to perform ROS   Physical Exam  Pulmonary/Chest:  Vetilator  Skin:  Edema    Vital Signs: BP (!) 108/47   Pulse 91   Temp 100.2 F (37.9 C) (Rectal)   Resp (!) 30   Ht 4' 9" (1.448 m)   Wt 72.7 kg (160 lb 4.4 oz)   SpO2 95%   BMI 34.68 kg/m  Pain Assessment: CPOT   Pain Score: 0-No pain   SpO2: SpO2: 95 % O2 Device:SpO2: 95 % O2 Flow Rate: .O2 Flow Rate (L/min): 6 L/min  IO: Intake/output summary:   Intake/Output Summary (Last 24 hours) at 06/24/2017 1540 Last data filed at 06/24/2017 1000 Gross per 24 hour  Intake 2402.93 ml  Output 1680 ml  Net 722.93 ml    LBM:   Baseline Weight: Weight: 68 kg (150 lb) Most recent weight: Weight: 72.7 kg (160 lb 4.4 oz)     Palliative Assessment/Data: 10%     Time In: 2:00 Time Out: 4:00 Time Total: 120 minutes Greater than 50%  of this time was spent counseling and coordinating care related to the above assessment and plan.  Signed by: Crystal Griffin, NP   Please contact Palliative Medicine Team phone at [phone] for questions and concerns.  For individual provider: See Amion             

## 2017-06-24 NOTE — Progress Notes (Signed)
Pharmacy Antibiotic Note  Dana [name] is a 83 y.o. female admitted on 06/09/2017 with end-stage COPD.  Pharmacy has been consulted for Levaquin dosing. Patient previously on doxycycline with respiratory culture now growing Pseudomonas.   Plan: New pharmacy consult for cefepime for HCAP. Patient has pseudomonas growing in respiratory culture. Start cefepime 2 gm IV Q24H (renally dosed for CrCL 10 to 30 mL/min).   Height: 4\' 9"  (144.8 cm) Weight: 160 lb 4.4 oz (72.7 kg) IBW/kg (Calculated) : 38.6  Temp (24hrs), Avg:100 F (37.8 C), Min:98.8 F (37.1 C), Max:101.1 F (38.4 C)  Recent Labs  Lab 06/21/2017 1215 06/23/17 0409 06/24/17 0645  WBC 7.7 14.4* 22.5*  CREATININE 1.31* 1.58* 1.36*    Estimated Creatinine Clearance: 26.3 mL/min (A) (by C-G formula based on SCr of 1.36 mg/dL (H)).    No Known Allergies  Antimicrobials this admission: doxycycline 12/24 >> 12/26 Levaqin 12/26 >>   Dose adjustments this admission:   Microbiology results: 12/25 BCx: NGTD 12/25 UCx: negative  12/24 Sputum: Pseudomonas aeruginosa  12/25 MRSA PCR: negative  Thank you for allowing pharmacy to be a part of this patient's care.  Carola FrostNathan A Banesa Tristan, Pharm.D., BCPS Clinical Pharmacist 06/24/2017 3:58 PM

## 2017-06-24 NOTE — Progress Notes (Signed)
[place] at [facility]                                                                                                                                                                                  Patient Demographics   Dana [name], is a 83 y.o. female, DOB - [birth year], TDH:[id number]  Admit date - 05/30/2017   Admitting Physician Nicholes Mango, MD  Outpatient Primary MD for the patient is Jene Every, MD   LOS - 2  Subjective: Pt remains intubated, sedated, on pressors.  On pressure support mode.   Review of Systems:   CONSTITUTIONAL: Intubated  Vitals:   Vitals:   06/24/17 1200 06/24/17 1230 06/24/17 1330 06/24/17 1345  BP: (!) 88/40 (!) 112/36 (!) 90/47 95/60  Pulse: 85 83 86 89  Resp: (!) 30 (!) 26 (!) 33 (!) 33  Temp:  100.2 F (37.9 C)    TempSrc:  Rectal    SpO2: 95% 93% 93% 93%  Weight:      Height:        Wt Readings from Last 3 Encounters:  06/14/2017 72.7 kg (160 lb 4.4 oz)  05/31/17 65.3 kg (143 lb 14.4 oz)  04/01/16 51.7 kg (114 lb)     Intake/Output Summary (Last 24 hours) at 06/24/2017 1401 Last data filed at 06/24/2017 0600 Gross per 24 hour  Intake 2845.11 ml  Output 1930 ml  Net 915.11 ml    Physical Exam:   GENERAL: Intubated HEAD, EYES, EARS, NOSE AND THROAT: Atraumatic, normocephalic. Extraocular muscles are intact. Pupils equal and reactive to light. Sclerae anicteric. No conjunctival injection. No oro-pharyngeal erythema.  Orally intubated. NECK: Supple. There is no jugular venous distention. No bruits, no lymphadenopathy, no thyromegaly.  HEART: Regular rate and rhythm,. No murmurs, no rubs, no clicks.  LUNGS: Decreased breath sounds on the ventilator ABDOMEN: Soft, flat, nontender, nondistended. Has good bowel sounds. No hepatosplenomegaly appreciated.  EXTREMITIES: No evidence of any cyanosis, clubbing, or peripheral edema.  +2 pedal and radial pulses bilaterally.  NEUROLOGIC:sedated SKIN: Moist and  warm with no rashes appreciated.  Psych: Sedated  LN: No inguinal LN enlargement    Antibiotics   Anti-infectives (From admission, onward)   Start     Dose/Rate Route Frequency Ordered Stop   06/26/17 1000  levofloxacin (LEVAQUIN) IVPB 750 mg     750 mg 100 mL/hr over 90 Minutes Intravenous Every 48 hours 06/24/17 1142     06/24/17 1300  levofloxacin (LEVAQUIN) IVPB 750 mg     750 mg 100 mL/hr over 90 Minutes Intravenous  Once 06/24/17 1142     06/13/2017 1630  doxycycline (VIBRAMYCIN) 100 mg in dextrose  5 % 250 mL IVPB  Status:  Discontinued     100 mg 125 mL/hr over 120 Minutes Intravenous Every 12 hours 06/25/2017 1501 06/24/17 1029      Medications   Scheduled Meds: . amiodarone  200 mg Per Tube Daily  . aspirin  81 mg Per Tube Daily  . budesonide (PULMICORT) nebulizer solution  0.25 mg Nebulization Q6H  . chlorhexidine gluconate (MEDLINE KIT)  15 mL Mouth Rinse BID  . heparin injection (subcutaneous)  5,000 Units Subcutaneous Q8H  . insulin aspart  0-15 Units Subcutaneous Q4H  . insulin glargine  10 Units Subcutaneous Daily  . ipratropium-albuterol  3 mL Nebulization Q6H  . mouth rinse  15 mL Mouth Rinse QID  . methylPREDNISolone (SOLU-MEDROL) injection  40 mg Intravenous Q12H  . multivitamin  15 mL Per Tube Daily  . sodium chloride flush  3 mL Intravenous Q12H   Continuous Infusions: . sodium chloride    . famotidine (PEPCID) IV Stopped (06/23/17 1725)  . feeding supplement (VITAL HIGH PROTEIN) 1,000 mL (06/24/17 1146)  . [START ON 06/26/2017] levofloxacin (LEVAQUIN) IV    . levofloxacin (LEVAQUIN) IV 750 mg (06/24/17 1233)  . norepinephrine (LEVOPHED) Adult infusion 6 mcg/min (06/24/17 1237)  . propofol (DIPRIVAN) infusion 15 mcg/kg/min (06/24/17 1115)   PRN Meds:.sodium chloride, acetaminophen, albuterol, fentaNYL (SUBLIMAZE) injection, ondansetron (ZOFRAN) IV, sodium chloride flush   Data Review:   Micro Results Recent Results (from the past 240 hour(s))   Culture, respiratory (NON-Expectorated)     Status: None (Preliminary result)   Collection Time: 06/18/2017  2:56 PM  Result Value Ref Range Status   Specimen Description   Final    TRACHEAL ASPIRATE Performed at [facility], [address], [place], [place] [address]    Special Requests   Final    NONE Performed at [facility], [address] [place], [place] [address]    Gram Stain   Final    ABUNDANT WBC PRESENT,BOTH PMN AND MONONUCLEAR RARE GRAM POSITIVE COCCI    Culture   Final    ABUNDANT PSEUDOMONAS AERUGINOSA SUSCEPTIBILITIES TO FOLLOW Performed at [facility], [place] [address], [place], [place] [address]    Report Status PENDING  Incomplete  CULTURE, BLOOD (ROUTINE X 2) w Reflex to ID Panel     Status: None (Preliminary result)   Collection Time: 06/23/17 12:28 AM  Result Value Ref Range Status   Specimen Description BLOOD LEFT FOREARM  Final   Special Requests   Final    BOTTLES DRAWN AEROBIC AND ANAEROBIC Blood Culture adequate volume   Culture   Final    NO GROWTH 1 DAY Performed at [facility], [address] [place], [place] [address]    Report Status PENDING  Incomplete  CULTURE, BLOOD (ROUTINE X 2) w Reflex to ID Panel     Status: None (Preliminary result)   Collection Time: 06/23/17 12:36 AM  Result Value Ref Range Status   Specimen Description BLOOD LEFT HAND  Final   Special Requests   Final    BOTTLES DRAWN AEROBIC AND ANAEROBIC Blood Culture adequate volume   Culture   Final    NO GROWTH 1 DAY Performed at [facility], [address] [place], [place] [address]    Report Status PENDING  Incomplete  Urine Culture     Status: None   Collection Time: 06/23/17 12:45 AM  Result Value Ref Range Status   Specimen Description   Final  URINE, RANDOM Performed at [facility], [address], [place], [place] [address]    Special Requests   Final    NONE Performed at [facility],  [address] [place], [place] [address]    Culture   Final    NO GROWTH Performed at [facility], [place] [address], [place], [place] [address]    Report Status 06/24/2017 FINAL  Final  MRSA PCR Screening     Status: None   Collection Time: 06/23/17  7:38 PM  Result Value Ref Range Status   MRSA by PCR NEGATIVE NEGATIVE Final    Comment:        The GeneXpert MRSA Assay (FDA approved for NASAL specimens only), is one component of a comprehensive MRSA colonization surveillance program. It is not intended to diagnose MRSA infection nor to guide or monitor treatment for MRSA infections. Performed at [facility], [address] [place], [place] [address]     Radiology Reports Dg Chest 2 View  Result Date: 05/26/2017 CLINICAL DATA:  Shortness of breath. Follow-up opacity overlying the medial left upper lung. EXAM: CHEST  2 VIEW COMPARISON:  05/26/2017 and prior exams FINDINGS: Upper limits normal heart size, small bilateral pleural effusions and bilateral lower lung atelectasis/ airspace disease again noted. Interstitial prominence is again identified. A linear opacity measuring up to 2 cm overlies the medial left upper lung. This is of uncertain etiology but could represent atelectasis. Recommend chest radiographic follow-up in 6-8 weeks. IMPRESSION: Linear opacity overlying the medial left upper lung, of uncertain etiology but could represent atelectasis. Chest radiographic follow-up in 6-8 weeks recommended. Punch small bilateral pleural effusions, bilateral lower lung atelectasis/ airspace disease and possible mild interstitial edema. Electronically Signed   By: Margarette Canada M.D.   On: 05/26/2017 20:27   US Venous Img Lower Bilateral  Result Date: 05/27/2017 CLINICAL DATA:  Bilateral lower extremity pain and edema for the past several weeks. History of smoking and varicose veins. History of malignancy. Evaluate for DVT. EXAM: BILATERAL LOWER EXTREMITY VENOUS DOPPLER  ULTRASOUND TECHNIQUE: Gray-scale sonography with graded compression, as well as color Doppler and duplex ultrasound were performed to evaluate the lower extremity deep venous systems from the level of the common femoral vein and including the common femoral, femoral, profunda femoral, popliteal and calf veins including the posterior tibial, peroneal and gastrocnemius veins when visible. The superficial great saphenous vein was also interrogated. Spectral Doppler was utilized to evaluate flow at rest and with distal augmentation maneuvers in the common femoral, femoral and popliteal veins. COMPARISON:  None. FINDINGS: RIGHT LOWER EXTREMITY Common Femoral Vein: No evidence of thrombus. Normal compressibility, respiratory phasicity and response to augmentation. Saphenofemoral Junction: No evidence of thrombus. Normal compressibility and flow on color Doppler imaging. Profunda Femoral Vein: No evidence of thrombus. Normal compressibility and flow on color Doppler imaging. Femoral Vein: No evidence of thrombus. Normal compressibility, respiratory phasicity and response to augmentation. Popliteal Vein: No evidence of thrombus. Normal compressibility, respiratory phasicity and response to augmentation. Calf Veins: No evidence of thrombus. Normal compressibility and flow on color Doppler imaging. Superficial Great Saphenous Vein: No evidence of thrombus. Normal compressibility. Venous Reflux:  None. Other Findings:  None. LEFT LOWER EXTREMITY Common Femoral Vein: No evidence of thrombus. Normal compressibility, respiratory phasicity and response to augmentation. Saphenofemoral Junction: No evidence of thrombus. Normal compressibility and flow on color Doppler imaging. Profunda Femoral Vein: No evidence of thrombus. Normal compressibility and flow on color Doppler imaging. Femoral Vein:  No evidence of thrombus. Normal compressibility, respiratory phasicity and response to augmentation. Popliteal Vein: No evidence of  thrombus. Normal compressibility, respiratory phasicity and response to augmentation. Calf Veins: No evidence of thrombus. Normal compressibility and flow on color Doppler imaging. Superficial Great Saphenous Vein: No evidence of thrombus. Normal compressibility. Venous Reflux:  None. Other Findings:  None. IMPRESSION: No evidence of DVT within either lower extremity. Electronically Signed   By: Sandi Mariscal M.D.   On: 05/27/2017 13:54   Dg Chest Port 1 View  Result Date: 06/24/2017 CLINICAL DATA:  Hypoxia EXAM: PORTABLE CHEST 1 VIEW COMPARISON:  June 23, 2017 FINDINGS: Endotracheal tube tip is 3.1 cm above the carina. Nasogastric tube tip and side port are below the diaphragm. No pneumothorax. There are pleural effusions bilaterally with patchy airspace opacity in both lower lobes. There is cardiomegaly with mild pulmonary venous hypertension. There is aortic atherosclerosis. No adenopathy. No bone lesions. IMPRESSION: Tubes as described without pneumothorax. Pleural effusions bilaterally with airspace opacity in both lower lobes, likely due primarily to atelectasis, although a degree of superimposed alveolar edema and/or pneumonia cannot be excluded in the lung bases. There is stable cardiomegaly with mild pulmonary venous hypertension. There is aortic atherosclerosis. Appearance is essentially stable compare to 1 day prior. Aortic Atherosclerosis (ICD10-I70.0). Electronically Signed   By: Lowella Grip III M.D.   On: 06/24/2017 07:07   Dg Chest Port 1 View  Result Date: 06/23/2017 CLINICAL DATA:  Respiratory failure EXAM: PORTABLE CHEST 1 VIEW COMPARISON:  Chest radiograph 06/03/2017 FINDINGS: Worsened aeration of the right lung base, with possible veiling pleural effusion. The endotracheal tube has been retracted and is now in satisfactory position at the level of the clavicular heads. Orogastric tube side port is in the stomach. There is calcific aortic arch atherosclerosis. Cardiomediastinal  contours are unchanged. Diffuse interstitial prominence remains. IMPRESSION: 1. Satisfactory position of endotracheal tube. 2. Worsening aeration of the right lung base with possible veiling pleural effusion. Electronically Signed   By: Ulyses Jarred M.D.   On: 06/23/2017 04:56   Dg Chest Portable 1 View  Result Date: 06/13/2017 CLINICAL DATA:  Intubation EXAM: PORTABLE CHEST 1 VIEW COMPARISON:  June 22, 2017 FINDINGS: The ETT terminates 17 mm above the carina. An OG tube terminates below today's film. Improving mild edema. Right greater than left pleural effusions with underlying opacities remain, unchanged. IMPRESSION: 1. The ETT terminates 17 mm above the carina. Recommend withdrawing 1 cm. 2. Improving edema. 3. Right greater than left pleural effusions with underlying opacities, similar in the interval. Electronically Signed   By: Dorise Bullion III M.D   On: 06/11/2017 13:34   Dg Chest Portable 1 View  Result Date: 06/15/2017 CLINICAL DATA:  Shortness of breath. EXAM: PORTABLE CHEST 1 VIEW COMPARISON:  May 29, 2017 FINDINGS: Cardiomegaly. Right greater than left pleural effusions with underlying opacities. Diffuse interstitial opacity. No other interval change. IMPRESSION: Findings are consistent with pulmonary edema. Bilateral pleural effusions with underlying opacities, right greater than left. The findings are similar in the interval. Electronically Signed   By: Dorise Bullion III M.D   On: 06/01/2017 12:36   Dg Chest Port 1 View  Result Date: 05/29/2017 CLINICAL DATA:  Respiratory failure. EXAM: PORTABLE CHEST 1 VIEW COMPARISON:  05/28/2017. FINDINGS: Mediastinum hilar structures are normal. Cardiomegaly with bilateral from interstitial prominence and bilateral pleural effusions consistent CHF. No change from prior exam. IMPRESSION: Congestive heart failure pulmonary interstitial edema and bilateral pleural effusions. No significant change from prior exam.  Electronically Signed    By: Marcello Moores  Register   On: 05/29/2017 09:26   Dg Chest Port 1 View  Result Date: 05/28/2017 CLINICAL DATA:  Respiratory failure EXAM: PORTABLE CHEST 1 VIEW COMPARISON:  05/26/2017 FINDINGS: Progression of heart failure with edema. Progression of bilateral effusions and bibasilar atelectasis. Cardiac enlargement. Left upper lobe linear density has improved. IMPRESSION: Progressive heart failure and edema. Progression of bilateral pleural effusions. Electronically Signed   By: Franchot Gallo M.D.   On: 05/28/2017 08:23   Dg Chest Portable 1 View  Result Date: 05/26/2017 CLINICAL DATA:  Difficulty breathing.  History of CHF. EXAM: PORTABLE CHEST 1 VIEW COMPARISON:  05/30/2016 and 03/29/2016 FINDINGS: Interstitial markings are prominent and raise concern for edema. There are densities at both lung bases which could represent volume loss and pleural fluid. Heart size appears to be upper limits of normal. Question increased densities along the medial left upper lung. Atherosclerotic calcifications at the aortic arch. Negative for a pneumothorax. IMPRESSION: Bibasilar densities are concerning for pleural fluid and basilar atelectasis. Prominent interstitial markings could represent mild edema. Questionable densities along the medial left upper lung. Recommend follow-up two view examination to better characterize this area. Electronically Signed   By: Markus Daft M.D.   On: 05/26/2017 16:47   Dg Abd Portable 1 View  Result Date: 06/12/2017 CLINICAL DATA:  Orogastric tube placement EXAM: PORTABLE ABDOMEN - 1 VIEW COMPARISON:  February 16, 2015 FINDINGS: Orogastric tube tip and side port are in the stomach. There is moderate stool throughout the colon. There is no bowel dilatation or air-fluid level to suggest bowel obstruction. No free air. There is aortic atherosclerosis. There is a small left pleural effusion. IMPRESSION: Orogastric tube tip and side port in stomach. No bowel obstruction or free air. Moderate  stool throughout colon. Small left pleural effusion. There is aortic atherosclerosis. Aortic Atherosclerosis (ICD10-I70.0). Electronically Signed   By: Lowella Grip III M.D.   On: 06/18/2017 13:33     CBC Recent Labs  Lab 06/21/2017 1215 06/23/17 0409 06/24/17 0645  WBC 7.7 14.4* 22.5*  HGB 10.9* 11.1* 10.1*  HCT 35.0 35.3 31.3*  PLT 101* 121* 104*  MCV 92.6 91.3 89.9  MCH 28.9 28.6 29.0  MCHC 31.2* 31.3* 32.3  RDW 16.5* 16.2* 16.1*  LYMPHSABS 0.7*  --   --   MONOABS 0.8  --   --   EOSABS 0.1  --   --   BASOSABS 0.0  --   --     Chemistries  Recent Labs  Lab 06/26/2017 1215 06/23/17 0409 06/24/17 0645  NA 142 140 136  K 3.9 3.7 3.9  CL 86* 85* 95*  CO2 46* 44* 33*  GLUCOSE 136* 174* 181*  BUN 23* 24* 45*  CREATININE 1.31* 1.58* 1.36*  CALCIUM 9.2 9.1 8.7*  AST  --   --  23  ALT  --   --  15  ALKPHOS  --   --  58  BILITOT  --   --  0.6   ------------------------------------------------------------------------------------------------------------------ estimated creatinine clearance is 26.3 mL/min (A) (by C-G formula based on SCr of 1.36 mg/dL (H)). ------------------------------------------------------------------------------------------------------------------ No results for input(s): HGBA1C in the last 72 hours. ------------------------------------------------------------------------------------------------------------------ Recent Labs    06/21/2017 1215  TRIG 53   ------------------------------------------------------------------------------------------------------------------ No results for input(s): TSH, T4TOTAL, T3FREE, THYROIDAB in the last 72 hours.  Invalid input(s): FREET3 ------------------------------------------------------------------------------------------------------------------ No results for input(s): VITAMINB12, FOLATE, FERRITIN, TIBC, IRON, RETICCTPCT in the last 72 hours.  Coagulation profile  No results for input(s): INR, PROTIME in the  last 168 hours.  No results for input(s): DDIMER in the last 72 hours.  Cardiac Enzymes Recent Labs  Lab 06/09/2017 1626 06/19/2017 2232 06/23/17 0409  TROPONINI <0.03 0.04* 0.07*   ------------------------------------------------------------------------------------------------------------------ Invalid input(s): POCBNP    Assessment & Plan   Dana [name]  is a 83 y.o. female with a known history of chronic COPD, hypertension, chronic congestive heart failure on loop diuretics is brought into the ED via EMS for severe shortness of breath. Patient was waiting to see her primary care physician but as her shortness of breath has been gotten worse family decided to call EMS and patient is brought into the ED. Patient chronically lives on 3 L of oxygen but patient was extremity hypoxemic at 70% with a significant rales and wheezes. Patient [place] Coma Scale was at 9 by the time of arrival  #Acute encephalopathy secondary to severe hypercarbia Admitted to intensive care unit Patient is intubated Continue vent support and wean as tolerated.  #Acute hypoxic and hypercarbic respiratory failure secondary to COPD exacerbation and CHF exacerbation Continue ventilator Continue Solu-Medrol Continue Pulmicort IV Levaquin. Patient echocardiogram showed EF more than 60%.  Patient has acute on chronic diastolic heart failure.  #Acute COPD exacerbation SoluMedrol and bronchodilator therapy  Leukocytosis secondary to prednisone. #Severe metabolic alkalosis due to chronic hypercarbic respiratory failure and loop diuretic use with history of acute on chronic diuresis  #possible chf - echo pending   #Hypotension On Levophed, continue pressors to keep map 60.  #Thrombocytopenia No active bleeding or bruising Follow closely,       Code Status Orders  (From admission, onward)        Start     Ordered   06/08/2017 1611  Full code  Continuous     06/29/2017 1611    Code Status History     Date Active Date Inactive Code Status Order ID Comments User Context   05/26/2017 20:34 06/01/2017 18:21 Full Code [id number]  Harrie Foreman, MD Inpatient   03/24/2016 16:47 04/01/2016 20:42 Full Code [id number]  de Dios, Mike Gip, MD Inpatient           Consults  intensivitst  DVT Prophylaxis  scd's  Lab Results  Component Value Date   PLT 104 (L) 06/24/2017     Time Spent in minutes 35 minutes greater than 50% of time spent in care coordination and counseling patient regarding the condition and plan of care.   Epifanio Lesches M.D on 06/24/2017 at 2:01 PM  Between 7am to 6pm - Pager - [phone]  After 6pm go to [email or web address] - password [email or web address] [facility]   Office  [phone]

## 2017-06-24 NOTE — Progress Notes (Signed)
Peripherally Inserted Central Catheter/Midline Placement  The IV Nurse has discussed with the patient and/or persons authorized to consent for the patient, the purpose of this procedure and the potential benefits and risks involved with this procedure.  The benefits include less needle sticks, lab draws from the catheter, and the patient may be discharged home with the catheter. Risks include, but not limited to, infection, bleeding, blood clot (thrombus formation), and puncture of an artery; nerve damage and irregular heartbeat and possibility to perform a PICC exchange if needed/ordered by physician.  Alternatives to this procedure were also discussed.  Bard Power PICC patient education guide, fact sheet on infection prevention and patient information card has been provided to patient /or left at bedside.    PICC/Midline Placement Documentation  PICC Double Lumen 06/24/17 PICC Right Brachial 37 cm 0 cm (Active)  Indication for Insertion or Continuance of Line Vasoactive infusions 06/24/2017  4:00 PM  Exposed Catheter (cm) 0 cm 06/24/2017  4:00 PM  Site Assessment Clean;Dry;Intact 06/24/2017  4:00 PM  Lumen #1 Status Flushed;Blood return noted 06/24/2017  4:00 PM  Lumen #2 Status Flushed;Blood return noted 06/24/2017  4:00 PM  Dressing Type Transparent 06/24/2017  4:00 PM  Dressing Status Clean;Dry;Intact;Antimicrobial disc in place 06/24/2017  4:00 PM  Dressing Intervention New dressing 06/24/2017  4:00 PM     Consent signed by daughter at bedside  Maximino GreenlandLumban, Makhi Muzquiz Albarece 06/24/2017, 4:17 PM

## 2017-06-24 NOTE — Progress Notes (Signed)
PULMONARY / CRITICAL CARE MEDICINE   Name: Dana [name] MRN: [id number] DOB: 01/09/1933    ADMISSION DATE:  10/29/2016 CONSULTATION DATE: June 18, 2017  REFERRING MD:  Amado CoeGouru  PT PROFILE: [age] F with end-stage COPD intubated in the ED for acute/chronic hypoxemic/hypercarbic respiratory failure and hypersomnolence.  [facility] for same.  During that hospitalization, did not require intubation but did require noninvasive ventilation for a couple of days.  MAJOR EVENTS/TEST RESULTS: 12/24 Admitted 12/25 Mild hypotension due to sedation. Low dose NE initiated. Pulmonary exam improved - weaning in PSV mode initiated 12/26 Failed SBT. CXR c/w PNA. Resp culture + for abundant pseudomonas. Abx adjusted 12/26 Palliative Care consultation:   INDWELLING DEVICES:: ETT 12/24 >>   MICRO DATA: Urine 12/24  >> NEG Resp 12/24 >> abundant Pseudomonas Blood 12/25 >>   ANTIMICROBIALS:  Doxycycline 12/24 >> 12/26 Levofloxacin 12/26 X 1 Cefepime 12/26 >>   SUBJECTIVE:  RASS -3 on propofol. Not F/C. Failed SBT  VITAL SIGNS: BP (!) 108/47   Pulse 91   Temp 100.2 F (37.9 C) (Rectal)   Resp (!) 30   Ht 4\' 9"  (1.448 m)   Wt 72.7 kg (160 lb 4.4 oz)   SpO2 95%   BMI 34.68 kg/m   HEMODYNAMICS:    VENTILATOR SETTINGS: Vent Mode: PSV FiO2 (%):  [30 %] 30 % Set Rate:  [15 bmp] 15 bmp Vt Set:  [450 mL] 450 mL PEEP:  [5 cmH20] 5 cmH20 Pressure Support:  [20 cmH20] 20 cmH20  INTAKE / OUTPUT: I/O last 3 completed shifts: In: 5309.7 [I.V.:3489.7; NG/GT:720; IV Piggyback:1100] Out: 3130 [Urine:3130]  PHYSICAL EXAMINATION: General: Intubated, sedated Neuro: CNs intact, moves all extremities HEENT: NCAT, sclerae white Cardiovascular: Reg, no M noted Lungs: prolonged exp phase. No wheezes. Copious thick mucopurulent secretions Abdomen: Obese, diminished bowel sounds, no palpable masses Extremities: Mild symmetric LE edema   LABS:  BMET Recent Labs   Lab June 18, 2017 1215 06/23/17 0409 06/24/17 0645  NA 142 140 136  K 3.9 3.7 3.9  CL 86* 85* 95*  CO2 46* 44* 33*  BUN 23* 24* 45*  CREATININE 1.31* 1.58* 1.36*  GLUCOSE 136* 174* 181*    Electrolytes Recent Labs  Lab June 18, 2017 1215 06/23/17 0409 06/24/17 0645  CALCIUM 9.2 9.1 8.7*    CBC Recent Labs  Lab June 18, 2017 1215 06/23/17 0409 06/24/17 0645  WBC 7.7 14.4* 22.5*  HGB 10.9* 11.1* 10.1*  HCT 35.0 35.3 31.3*  PLT 101* 121* 104*    Coag's No results for input(s): APTT, INR in the last 168 hours.  Sepsis Markers Recent Labs  Lab 06/23/17 0028 06/24/17 0645  PROCALCITON <0.10 1.23    ABG Recent Labs  Lab June 18, 2017 1406  PHART 7.46*  PCO2ART 80*  PO2ART 68*    Liver Enzymes Recent Labs  Lab 06/24/17 0645  AST 23  ALT 15  ALKPHOS 58  BILITOT 0.6  ALBUMIN 2.6*    Cardiac Enzymes Recent Labs  Lab June 18, 2017 1626 June 18, 2017 2232 06/23/17 0409  TROPONINI <0.03 0.04* 0.07*    Glucose Recent Labs  Lab 06/23/17 1651 06/23/17 1943 06/23/17 2337 06/24/17 0351 06/24/17 0801 06/24/17 1203  GLUCAP 134* 152* 158* 137* 191* 164*    CXR: RLL > LLL infiltrate/atx/eff   ASSESSMENT / PLAN:  PULMONARY A: Acute/chronic hypercarbic/hypoxemic respiratory failure End-stage COPD with acute exacerbation Probable HCAP P:   Cont vent support - settings reviewed and/or adjusted PSV weaning as tolerated Cont vent bundle Daily SBT if/when  meets criteria Cont nebulized steroids and bronchodilators Cont systemic steroids  CARDIOVASCULAR A:  History of diastolic heart failure Probable pulmonary hypertension Hypotension due to sedation H/O PAF P:  Continue norepinephrine to maintain MAP >60 mmHg Cont amiodarone   RENAL A:   Severe metabolic alkalosis, resolved Hypervolemia, improving P:   Monitor BMET intermittently Monitor I/Os Correct electrolytes as indicated  DC IVFs  GASTROINTESTINAL A:   Obesity P:   SUP: IV famotidine TF  protocol initiated 12/25  HEMATOLOGIC A:   Chronic anemia Mild thrombocytopenia P:  DVT px: SQ heparin Monitor CBC intermittently Transfuse per usual guidelines   INFECTIOUS A:   Suspect pseudomonas HCAP Elevated PCT Severe sepsis P:   Monitor temp, WBC count Micro and abx as above   ENDOCRINE A:   Mild hyperglycemia without dx of DM P:   Cont SSI while on systemic steroids Lantus initiated 12/25  NEUROLOGIC A:   Acute encephalopathy ICU/ventilator associated discomfort Poor baseline functional status P:   RASS goal: -1, -2 PAD protocol -propofol, intermittent fentanyl Palliative Care consultation    FAMILY  - Updates: Daughter updated at bedside    CCM time: 45 mins The above time includes time spent in consultation with patient and/or family members and reviewing care plan on multidisciplinary rounds  Billy Fischeravid Kristinia Leavy, MD PCCM service Mobile [phone] Pager [phone] 06/24/2017, 3:47 PM

## 2017-06-25 ENCOUNTER — Inpatient Hospital Stay: Payer: Medicare Other

## 2017-06-25 ENCOUNTER — Other Ambulatory Visit: Payer: Self-pay

## 2017-06-25 DIAGNOSIS — J189 Pneumonia, unspecified organism: Secondary | ICD-10-CM

## 2017-06-25 DIAGNOSIS — Z9911 Dependence on respirator [ventilator] status: Secondary | ICD-10-CM

## 2017-06-25 LAB — CBC
HEMATOCRIT: 30.1 % — AB (ref 35.0–47.0)
Hemoglobin: 9.6 g/dL — ABNORMAL LOW (ref 12.0–16.0)
MCH: 28.7 pg (ref 26.0–34.0)
MCHC: 32 g/dL (ref 32.0–36.0)
MCV: 89.7 fL (ref 80.0–100.0)
Platelets: 97 10*3/uL — ABNORMAL LOW (ref 150–440)
RBC: 3.36 MIL/uL — ABNORMAL LOW (ref 3.80–5.20)
RDW: 16.8 % — AB (ref 11.5–14.5)
WBC: 25.5 10*3/uL — ABNORMAL HIGH (ref 3.6–11.0)

## 2017-06-25 LAB — GLUCOSE, CAPILLARY
GLUCOSE-CAPILLARY: 148 mg/dL — AB (ref 65–99)
GLUCOSE-CAPILLARY: 168 mg/dL — AB (ref 65–99)
GLUCOSE-CAPILLARY: 150 mg/dL — AB (ref 65–99)
GLUCOSE-CAPILLARY: 138 mg/dL — AB (ref 65–99)
Glucose-Capillary: 140 mg/dL — ABNORMAL HIGH (ref 65–99)

## 2017-06-25 LAB — BASIC METABOLIC PANEL
Anion gap: 9 (ref 5–15)
BUN: 52 mg/dL — AB (ref 6–20)
CHLORIDE: 93 mmol/L — AB (ref 101–111)
CO2: 33 mmol/L — ABNORMAL HIGH (ref 22–32)
CREATININE: 1.36 mg/dL — AB (ref 0.44–1.00)
Calcium: 8.9 mg/dL (ref 8.9–10.3)
GFR calc Af Amer: 41 mL/min — ABNORMAL LOW (ref >60)
GFR calc non Af Amer: 35 mL/min — ABNORMAL LOW (ref >60)
GLUCOSE: 243 mg/dL — AB (ref 65–99)
POTASSIUM: 4 mmol/L (ref 3.5–5.1)
Sodium: 135 mmol/L (ref 135–145)

## 2017-06-25 LAB — TRIGLYCERIDES: Triglycerides: 64 mg/dL (ref <150)

## 2017-06-25 LAB — PROCALCITONIN: Procalcitonin: 5.44 ng/mL

## 2017-06-25 MED ORDER — SODIUM CHLORIDE 0.9 % IV SOLN
0.0000 ug/min | INTRAVENOUS | Status: DC
  Administered 2017-06-25: 100 ug/min via INTRAVENOUS
  Filled 2017-06-25: qty 10
  Filled 2017-06-25: qty 1

## 2017-06-25 MED ORDER — CHLORHEXIDINE GLUCONATE 0.12 % MT SOLN
OROMUCOSAL | Status: AC
  Administered 2017-06-25: 15 mL via OROMUCOSAL
  Filled 2017-06-25: qty 15

## 2017-06-25 MED ORDER — PHENYLEPHRINE HCL 10 MG/ML IJ SOLN
0.0000 ug/min | INTRAMUSCULAR | Status: DC
  Administered 2017-06-25: 120 ug/min via INTRAVENOUS
  Administered 2017-06-25: 65 ug/min via INTRAVENOUS
  Administered 2017-06-26: 64 ug/min via INTRAVENOUS
  Administered 2017-06-26: 45 ug/min via INTRAVENOUS
  Administered 2017-06-29: 5 ug/min via INTRAVENOUS
  Administered 2017-06-30: 20 ug/min via INTRAVENOUS
  Filled 2017-06-25: qty 4
  Filled 2017-06-25 (×5): qty 40

## 2017-06-25 MED ORDER — AMIODARONE HCL 200 MG PO TABS
400.0000 mg | ORAL_TABLET | Freq: Two times a day (BID) | ORAL | Status: DC
  Administered 2017-06-25 – 2017-06-26 (×2): 400 mg
  Filled 2017-06-25 (×2): qty 2

## 2017-06-25 MED ORDER — AMIODARONE IV BOLUS ONLY 150 MG/100ML
150.0000 mg | Freq: Once | INTRAVENOUS | Status: AC
Start: 2017-06-25 — End: 2017-06-25
  Administered 2017-06-25: 150 mg via INTRAVENOUS
  Filled 2017-06-25: qty 100

## 2017-06-25 MED ORDER — PREDNISONE 20 MG PO TABS
20.0000 mg | ORAL_TABLET | Freq: Every day | ORAL | Status: DC
  Administered 2017-06-26 – 2017-06-30 (×5): 20 mg
  Filled 2017-06-25 (×5): qty 1

## 2017-06-25 MED ORDER — FAMOTIDINE 20 MG PO TABS
20.0000 mg | ORAL_TABLET | Freq: Every day | ORAL | Status: DC
  Administered 2017-06-26 – 2017-06-30 (×5): 20 mg
  Filled 2017-06-25 (×5): qty 1

## 2017-06-25 NOTE — Progress Notes (Signed)
RN made Dr. Sung AmabileSimonds aware that patient went into afib rate 120-130's and patient on oral amiodarone daily.  MD stated he would see patient.

## 2017-06-25 NOTE — Progress Notes (Addendum)
Daily Progress Note   Patient Name: Dana [name]       Date: 06/25/2017 DOB: 22-Nov-1932  Age: 83 y.o. MRN#: [id number] Attending Physician: Epifanio Lesches, MD Primary Care Physician: Jene Every, MD Admit Date: 06/04/2017  Reason for Consultation/Follow-up: Establishing goals of care  Subjective: Dana [name] is resting in bed intubated. Nurses titrating medications due to a-fib and softer pressures. Daughter and granddaughter at bedside. Daughter who was reluctant for DNR status yesterday states she talked to her sister yesterday, and would like for her mother to be a DNR at this time, with no chest compressions or shocks. We discussed again the plan which was discussed with CCM yesteday for one way extubation no later than Monday, and sooner if family would like. They understand, and will discuss as a family.    MOST form with only code status for DNR checked as we are continuing full care at this time.   Length of Stay: 3  Current Medications: Scheduled Meds:  . amiodarone  400 mg Per Tube BID  . aspirin  81 mg Per Tube Daily  . budesonide (PULMICORT) nebulizer solution  0.25 mg Nebulization Q6H  . chlorhexidine gluconate (MEDLINE KIT)  15 mL Mouth Rinse BID  . heparin injection (subcutaneous)  5,000 Units Subcutaneous Q8H  . insulin aspart  0-15 Units Subcutaneous Q4H  . insulin glargine  10 Units Subcutaneous Daily  . ipratropium-albuterol  3 mL Nebulization Q6H  . mouth rinse  15 mL Mouth Rinse QID  . multivitamin  15 mL Per Tube Daily  . [START ON 06/26/2017] predniSONE  20 mg Per Tube Daily  . sodium chloride flush  10-40 mL Intracatheter Q12H  . sodium chloride flush  3 mL Intravenous Q12H    Continuous Infusions: . sodium chloride    . ceFEPime (MAXIPIME) IV  Stopped (06/24/17 1800)  . famotidine (PEPCID) IV Stopped (06/24/17 1702)  . feeding supplement (VITAL HIGH PROTEIN) 1,000 mL (06/25/17 0624)  . norepinephrine (LEVOPHED) Adult infusion 6 mcg/min (06/25/17 1150)  . phenylephrine (NEO-SYNEPHRINE) Adult infusion 100 mcg/min (06/25/17 1118)  . propofol (DIPRIVAN) infusion 25 mcg/kg/min (06/25/17 1004)    PRN Meds: sodium chloride, acetaminophen, albuterol, fentaNYL (SUBLIMAZE) injection, ondansetron (ZOFRAN) IV, sodium chloride flush, sodium chloride flush  Physical Exam  Constitutional: No distress.  Pulmonary/Chest:  Intubated  Vital Signs: BP (!) 129/57   Pulse (!) 101   Temp 99.5 F (37.5 C) (Rectal)   Resp (!) 21   Ht '4\' 9"'$  (1.448 m)   Wt 72.7 kg (160 lb 4.4 oz)   SpO2 91%   BMI 34.68 kg/m  SpO2: SpO2: 91 % O2 Device: O2 Device: Ventilator O2 Flow Rate: O2 Flow Rate (L/min): 6 L/min  Intake/output summary:   Intake/Output Summary (Last 24 hours) at 06/25/2017 1152 Last data filed at 06/25/2017 0600 Gross per 24 hour  Intake 1825.64 ml  Output 1515 ml  Net 310.64 ml   LBM: Last BM Date: 06/21/17 Baseline Weight: Weight: 68 kg (150 lb) Most recent weight: Weight: 72.7 kg (160 lb 4.4 oz)       Palliative Assessment/Data: 10%      Patient Active Problem List   Diagnosis Date Noted  . Acute respiratory failure with hypoxia ([place]) 06/21/2017  . DNR (do not resuscitate) discussion   . Palliative care by specialist   . COPD exacerbation ([place])   . Sepsis ([place]) 05/26/2017  . Acute respiratory failure with hypoxia and hypercarbia ([facility])   . Acute on chronic congestive heart failure ([place])   . Respiratory failure ([place]) 03/24/2016  . CAP (community acquired pneumonia)   . Demand ischemia [facility])     Palliative Care Assessment & Plan   Patient Profile:  42 F withend-stage COPD intubated in the ED for respiratory failure and hypersomnolence. Recently discharged from [facility] after hospitalization for the same.  During prior hospitalization, did not require intubation but did require noninvasive ventilation for a couple of days.     Assessment: Blood pressure and rhythm changes since yesterday.  Recommendations/Plan:  Family to discuss extubation.     Code Status:    Code Status Orders  (From admission, onward)        Start     Ordered   06/25/17 1151  Do not attempt resuscitation (DNR)  Continuous    Question Answer Comment  In the event of cardiac or respiratory ARREST Do not call a "code blue"   In the event of cardiac or respiratory ARREST Do not perform Intubation, CPR, defibrillation or ACLS   In the event of cardiac or respiratory ARREST Use medication by any route, position, wound care, and other measures to relive pain and suffering. May use oxygen, suction and manual treatment of airway obstruction as needed for comfort.      06/25/17 1150    Code Status History    Date Active Date Inactive Code Status Order ID Comments User Context   06/24/2017 16:11 06/25/2017 11:50 Full Code [id number]  Nicholes Mango, MD Inpatient   05/26/2017 20:34 06/01/2017 18:21 Full Code [id number]  Harrie Foreman, MD Inpatient   03/24/2016 16:47 04/01/2016 20:42 Full Code [id number]  de Dios, Mike Gip, MD Inpatient       Prognosis:   < 2 weeks Depending on how she does with discontinuation from ventilator.   Discharge Planning:  To Be Determined   Thank you for allowing the Palliative Medicine Team to assist in the care of this patient.   Time In: 10:15 11:50 Time Out: 10: 35 12:00 Total Time 20 min 50 min Prolonged Time Billed Yes  Per nursing, family running late for meeting at bedside.     Greater than 50%  of this time was spent counseling and coordinating care related to the above assessment and plan.  Asencion Gowda, NP  Please contact Palliative Medicine  Team phone at [phone] for questions and concerns.

## 2017-06-25 NOTE — Progress Notes (Signed)
PULMONARY / CRITICAL CARE MEDICINE   Name: Dana [name] MRN: [id number] DOB: 11/05/1932    ADMISSION DATE:  06/28/2017 CONSULTATION DATE: 06/03/2017  REFERRING MD:  Amado CoeGouru  PT PROFILE: [age] F with end-stage COPD intubated in the ED for acute/chronic hypoxemic/hypercarbic respiratory failure and hypersomnolence.  [facility].  During that hospitalization, did not require intubation but did require noninvasive ventilation for a couple of days.  MAJOR EVENTS/TEST RESULTS: 12/24 Admitted 12/25 Mild hypotension due to sedation. Low dose NE initiated. Pulmonary exam improved - weaning in PSV mode initiated 12/26 Failed SBT. CXR c/w PNA. Resp culture + for abundant pseudomonas. Abx adjusted 12/27 Palliative Care consultation: Made DNR 12/27 No progress weaning.  Developed AF with RVR.  Received amiodarone bolus and enteral amiodarone dose increased  INDWELLING DEVICES:: ETT 12/24 >>   MICRO DATA: Urine 12/24  >> NEG Resp 12/24 >> abundant Pseudomonas Blood 12/25 >>   ANTIMICROBIALS:  Doxycycline 12/24 >> 12/26 Levofloxacin 12/26 X 1 Cefepime 12/26 >>   SUBJECTIVE:  RASS -4 on propofol. Not F/C. Failed SBT  VITAL SIGNS: BP (!) 112/58   Pulse 78   Temp 99.7 F (37.6 C) (Rectal)   Resp (!) 22   Ht 4\' 9"  (1.448 m)   Wt 72.7 kg (160 lb 4.4 oz)   SpO2 91%   BMI 34.68 kg/m   HEMODYNAMICS:    VENTILATOR SETTINGS: Vent Mode: Spontaneous FiO2 (%):  [30 %] 30 % Set Rate:  [15 bmp] 15 bmp Vt Set:  [450 mL] 450 mL PEEP:  [5 cmH20] 5 cmH20 Pressure Support:  [18 cmH20] 18 cmH20  INTAKE / OUTPUT: I/O last 3 completed shifts: In: 3446.6 [I.V.:1844.9; NG/GT:1351.7; IV Piggyback:250] Out: 2995 [Urine:2995]  PHYSICAL EXAMINATION: General: Intubated, sedated Neuro: CNs intact, moves all extremities HEENT: NCAT, sclerae white Cardiovascular: IRIR, no M noted Lungs: prolonged exp phase. No wheezes. Copious thick mucopurulent  secretions Abdomen: Obese, diminished bowel sounds, no palpable masses Extremities: Mild symmetric LE edema   LABS:  BMET Recent Labs  Lab 06/23/17 0409 06/24/17 0645 06/25/17 0327  NA 140 136 135  K 3.7 3.9 4.0  CL 85* 95* 93*  CO2 44* 33* 33*  BUN 24* 45* 52*  CREATININE 1.58* 1.36* 1.36*  GLUCOSE 174* 181* 243*    Electrolytes Recent Labs  Lab 06/23/17 0409 06/24/17 0645 06/25/17 0327  CALCIUM 9.1 8.7* 8.9    CBC Recent Labs  Lab 06/23/17 0409 06/24/17 0645 06/25/17 0327  WBC 14.4* 22.5* 25.5*  HGB 11.1* 10.1* 9.6*  HCT 35.3 31.3* 30.1*  PLT 121* 104* 97*    Coag's No results for input(s): APTT, INR in the last 168 hours.  Sepsis Markers Recent Labs  Lab 06/23/17 0028 06/24/17 0645 06/25/17 0327  PROCALCITON <0.10 1.23 5.44    ABG Recent Labs  Lab 05/31/2017 1406  PHART 7.46*  PCO2ART 80*  PO2ART 68*    Liver Enzymes Recent Labs  Lab 06/24/17 0645  AST 23  ALT 15  ALKPHOS 58  BILITOT 0.6  ALBUMIN 2.6*    Cardiac Enzymes Recent Labs  Lab 06/12/2017 1626 06/07/2017 2232 06/23/17 0409  TROPONINI <0.03 0.04* 0.07*    Glucose Recent Labs  Lab 06/24/17 1705 06/24/17 2011 06/24/17 2321 06/25/17 0319 06/25/17 0724 06/25/17 1145  GLUCAP 127* 157* 194* 140* 148* 168*    CXR: NSC R>L infitrates   ASSESSMENT / PLAN:  PULMONARY A: Acute/chronic hypercarbic/hypoxemic respiratory failure End-stage COPD with acute exacerbation Pseudomonas HCAP  P:   Cont vent support - settings reviewed and/or adjusted PSV weaning as tolerated Cont vent bundle Daily SBT if/when meets criteria Cont nebulized steroids and bronchodilators Taper systemic steroids  CARDIOVASCULAR A:  History of diastolic heart failure Probable pulmonary hypertension Hypotension due to sedation Paroxysmal atrial fibrillation P:  Cont amiodarone at increased dose (400 mg twice daily) Transition from norepinephrine to phenylephrine DNR in event of cardiac  arrest  RENAL A:   Severe metabolic alkalosis, resolved Hypervolemia, improving P:   Monitor BMET intermittently Monitor I/Os Correct electrolytes as indicated   GASTROINTESTINAL A:   Obesity P:   SUP: IV famotidine TF protocol initiated 12/25  HEMATOLOGIC A:   Chronic anemia Mild thrombocytopenia P:  DVT px: SQ heparin Monitor CBC intermittently Transfuse per usual guidelines   INFECTIOUS A:   Pseudomonas HCAP Elevated PCT Severe sepsis P:   Monitor temp, WBC count Micro and abx as above   ENDOCRINE A:   Mild hyperglycemia without dx of DM P:   Cont SSI while on systemic steroids Lantus initiated 12/25  NEUROLOGIC A:   Acute encephalopathy ICU/ventilator associated discomfort Poor baseline functional status P:   RASS goal: -1, -2 PAD protocol -propofol, intermittent fentanyl Palliative Care consultation    FAMILY  - Updates: Granddaughter updated at bedside    CCM time: 35 mins The above time includes time spent in consultation with patient and/or family members and reviewing care plan on multidisciplinary rounds  Billy Fischeravid Simonds, MD PCCM service Mobile [phone] Pager [phone] 06/25/2017, 4:51 PM

## 2017-06-25 NOTE — Progress Notes (Signed)
[facility] [facility]                                                                                                                                                                                  Patient Demographics   Dana [name], is a 83 y.o. female, DOB - [birth year], ONG:[id number]  Admit date - 06/06/2017   Admitting Physician Nicholes Mango, MD  Outpatient Primary MD for the patient is Jene Every, MD   LOS - 3  Subjective: Pt remains intubated, sedated, on pressors..on Full vent support.  Family signed DNR.   Review of Systems:   CONSTITUTIONAL: Intubated  Vitals:   Vitals:   06/25/17 1550 06/25/17 1555 06/25/17 1600 06/25/17 1605  BP: (!) 98/48 (!) 112/55 (!) 123/51 (!) 119/92  Pulse: 80 76 76 78  Resp: 15 17 17 19   Temp:      TempSrc:      SpO2: 93% 90% 92% 92%  Weight:      Height:        Wt Readings from Last 3 Encounters:  06/09/2017 72.7 kg (160 lb 4.4 oz)  05/31/17 65.3 kg (143 lb 14.4 oz)  04/01/16 51.7 kg (114 lb)     Intake/Output Summary (Last 24 hours) at 06/25/2017 1609 Last data filed at 06/25/2017 1300 Gross per 24 hour  Intake 2095.08 ml  Output 1815 ml  Net 280.08 ml  Review of systems unobtainable because of intubation and sedation.  Physical Exam:   GENERAL: Intubated HEAD, EYES, EARS, NOSE AND THROAT: Atraumatic, normocephalic. Extraocular muscles are intact. Pupils equal and reactive to light. Sclerae anicteric. No conjunctival injection. No oro-pharyngeal erythema.  Orally intubated. NECK: Supple. There is no jugular venous distention. No bruits, no lymphadenopathy, no thyromegaly.  HEART: Regular rate and rhythm,. No murmurs, no rubs, no clicks.  LUNGS: Decreased breath sounds on the ventilator ABDOMEN: Soft, flat, nontender, nondistended. Has good bowel sounds. No hepatosplenomegaly appreciated.  EXTREMITIES: No evidence of any cyanosis, clubbing, or peripheral edema.  +2 pedal and radial pulses  bilaterally.  NEUROLOGIC:sedated SKIN: Moist and warm with no rashes appreciated.  Psych: Sedated  LN: No inguinal LN enlargement    Antibiotics   Anti-infectives (From admission, onward)   Start     Dose/Rate Route Frequency Ordered Stop   06/26/17 1000  levofloxacin (LEVAQUIN) IVPB 750 mg  Status:  Discontinued     750 mg 100 mL/hr over 90 Minutes Intravenous Every 48 hours 06/24/17 1142 06/24/17 1551   06/24/17 1600  ceFEPIme (MAXIPIME) 1 g in dextrose 5 % 50 mL IVPB  Status:  Discontinued     1 g 100 mL/hr over 30 Minutes  Intravenous Every 24 hours 06/24/17 1555 06/24/17 1557   06/24/17 1600  ceFEPIme (MAXIPIME) 2 g in dextrose 5 % 50 mL IVPB     2 g 100 mL/hr over 30 Minutes Intravenous Every 24 hours 06/24/17 1557     06/24/17 1300  levofloxacin (LEVAQUIN) IVPB 750 mg     750 mg 100 mL/hr over 90 Minutes Intravenous  Once 06/24/17 1142 06/24/17 1459   06/25/2017 1630  doxycycline (VIBRAMYCIN) 100 mg in dextrose 5 % 250 mL IVPB  Status:  Discontinued     100 mg 125 mL/hr over 120 Minutes Intravenous Every 12 hours 06/16/2017 1501 06/24/17 1029      Medications   Scheduled Meds: . amiodarone  400 mg Per Tube BID  . aspirin  81 mg Per Tube Daily  . budesonide (PULMICORT) nebulizer solution  0.25 mg Nebulization Q6H  . chlorhexidine gluconate (MEDLINE KIT)  15 mL Mouth Rinse BID  . heparin injection (subcutaneous)  5,000 Units Subcutaneous Q8H  . insulin aspart  0-15 Units Subcutaneous Q4H  . insulin glargine  10 Units Subcutaneous Daily  . ipratropium-albuterol  3 mL Nebulization Q6H  . mouth rinse  15 mL Mouth Rinse QID  . multivitamin  15 mL Per Tube Daily  . [START ON 06/26/2017] predniSONE  20 mg Per Tube Daily  . sodium chloride flush  10-40 mL Intracatheter Q12H  . sodium chloride flush  3 mL Intravenous Q12H   Continuous Infusions: . sodium chloride    . ceFEPime (MAXIPIME) IV Stopped (06/24/17 1800)  . famotidine (PEPCID) IV Stopped (06/24/17 1702)  . feeding  supplement (VITAL HIGH PROTEIN) 1,000 mL (06/25/17 0624)  . norepinephrine (LEVOPHED) Adult infusion Stopped (06/25/17 1219)  . phenylephrine (NEO-SYNEPHRINE) Adult infusion 130 mcg/min (06/25/17 1424)  . propofol (DIPRIVAN) infusion 25 mcg/kg/min (06/25/17 1004)   PRN Meds:.sodium chloride, acetaminophen, albuterol, fentaNYL (SUBLIMAZE) injection, ondansetron (ZOFRAN) IV, sodium chloride flush, sodium chloride flush   Data Review:   Micro Results Recent Results (from the past 240 hour(s))  Culture, respiratory (NON-Expectorated)     Status: None (Preliminary result)   Collection Time: 06/11/2017  2:56 PM  Result Value Ref Range Status   Specimen Description   Final    TRACHEAL ASPIRATE Performed at [facility], [address] [place], [place] [address]    Special Requests   Final    NONE Performed at [facility], [address] [place], [place] [address]    Gram Stain   Final    ABUNDANT WBC PRESENT,BOTH PMN AND MONONUCLEAR RARE GRAM POSITIVE COCCI    Culture   Final    ABUNDANT PSEUDOMONAS AERUGINOSA SUSCEPTIBILITIES TO FOLLOW Performed at [facility], [place] [address] [place], [place] [address]    Report Status PENDING  Incomplete  CULTURE, BLOOD (ROUTINE X 2) w Reflex to ID Panel     Status: None (Preliminary result)   Collection Time: 06/23/17 12:28 AM  Result Value Ref Range Status   Specimen Description BLOOD LEFT FOREARM  Final   Special Requests   Final    BOTTLES DRAWN AEROBIC AND ANAEROBIC Blood Culture adequate volume   Culture   Final    NO GROWTH 2 DAYS Performed at [facility], [address] [place], [place] [address]    Report Status PENDING  Incomplete  CULTURE, BLOOD (ROUTINE X 2) w Reflex to ID Panel     Status: None (Preliminary result)   Collection Time: 06/23/17 12:36 AM  Result Value  Ref Range Status   Specimen Description BLOOD LEFT HAND  Final   Special Requests   Final    BOTTLES DRAWN AEROBIC AND  ANAEROBIC Blood Culture adequate volume   Culture   Final    NO GROWTH 2 DAYS Performed at [facility], [address], [place], [place] [address]    Report Status PENDING  Incomplete  Urine Culture     Status: None   Collection Time: 06/23/17 12:45 AM  Result Value Ref Range Status   Specimen Description   Final    URINE, RANDOM Performed at [facility], [address] [place], [place] [address]    Special Requests   Final    NONE Performed at [facility], [address], [place], [place] [address]    Culture   Final    NO GROWTH Performed at [facility], [place] [address] [place], [place] [address]    Report Status 06/24/2017 FINAL  Final  MRSA PCR Screening     Status: None   Collection Time: 06/23/17  7:38 PM  Result Value Ref Range Status   MRSA by PCR NEGATIVE NEGATIVE Final    Comment:        The GeneXpert MRSA Assay (FDA approved for NASAL specimens only), is one component of a comprehensive MRSA colonization surveillance program. It is not intended to diagnose MRSA infection nor to guide or monitor treatment for MRSA infections. Performed at [facility], [address] [place], [place] [address]     Radiology Reports Dg Chest 2 View  Result Date: 05/26/2017 CLINICAL DATA:  Shortness of breath. Follow-up opacity overlying the medial left upper lung. EXAM: CHEST  2 VIEW COMPARISON:  05/26/2017 and prior exams FINDINGS: Upper limits normal heart size, small bilateral pleural effusions and bilateral lower lung atelectasis/ airspace disease again noted. Interstitial prominence is again identified. A linear opacity measuring up to 2 cm overlies the medial left upper lung. This is of uncertain etiology but could represent atelectasis. Recommend chest radiographic follow-up in 6-8 weeks. IMPRESSION: Linear opacity overlying the medial left upper lung, of uncertain etiology but could represent atelectasis. Chest  radiographic follow-up in 6-8 weeks recommended. Punch small bilateral pleural effusions, bilateral lower lung atelectasis/ airspace disease and possible mild interstitial edema. Electronically Signed   By: Margarette Canada M.D.   On: 05/26/2017 20:27   US Venous Img Lower Bilateral  Result Date: 05/27/2017 CLINICAL DATA:  Bilateral lower extremity pain and edema for the past several weeks. History of smoking and varicose veins. History of malignancy. Evaluate for DVT. EXAM: BILATERAL LOWER EXTREMITY VENOUS DOPPLER ULTRASOUND TECHNIQUE: Gray-scale sonography with graded compression, as well as color Doppler and duplex ultrasound were performed to evaluate the lower extremity deep venous systems from the level of the common femoral vein and including the common femoral, femoral, profunda femoral, popliteal and calf veins including the posterior tibial, peroneal and gastrocnemius veins when visible. The superficial great saphenous vein was also interrogated. Spectral Doppler was utilized to evaluate flow at rest and with distal augmentation maneuvers in the common femoral, femoral and popliteal veins. COMPARISON:  None. FINDINGS: RIGHT LOWER EXTREMITY Common Femoral Vein: No evidence of thrombus. Normal compressibility, respiratory phasicity and response to augmentation. Saphenofemoral Junction: No evidence of thrombus. Normal compressibility and flow on color Doppler imaging. Profunda Femoral Vein: No evidence of thrombus. Normal compressibility and flow on color Doppler imaging. Femoral Vein: No evidence of thrombus. Normal compressibility, respiratory phasicity and response to  augmentation. Popliteal Vein: No evidence of thrombus. Normal compressibility, respiratory phasicity and response to augmentation. Calf Veins: No evidence of thrombus. Normal compressibility and flow on color Doppler imaging. Superficial Great Saphenous Vein: No evidence of thrombus. Normal compressibility. Venous Reflux:  None. Other  Findings:  None. LEFT LOWER EXTREMITY Common Femoral Vein: No evidence of thrombus. Normal compressibility, respiratory phasicity and response to augmentation. Saphenofemoral Junction: No evidence of thrombus. Normal compressibility and flow on color Doppler imaging. Profunda Femoral Vein: No evidence of thrombus. Normal compressibility and flow on color Doppler imaging. Femoral Vein: No evidence of thrombus. Normal compressibility, respiratory phasicity and response to augmentation. Popliteal Vein: No evidence of thrombus. Normal compressibility, respiratory phasicity and response to augmentation. Calf Veins: No evidence of thrombus. Normal compressibility and flow on color Doppler imaging. Superficial Great Saphenous Vein: No evidence of thrombus. Normal compressibility. Venous Reflux:  None. Other Findings:  None. IMPRESSION: No evidence of DVT within either lower extremity. Electronically Signed   By: Sandi Mariscal M.D.   On: 05/27/2017 13:54   Dg Chest Port 1 View  Result Date: 06/25/2017 CLINICAL DATA:  Respiratory failure EXAM: PORTABLE CHEST 1 VIEW COMPARISON:  06/24/2017 FINDINGS: Cardiac shadow is stable. Endotracheal tube, nasogastric catheter and right-sided PICC line are noted in satisfactory position. Extrinsic artifact is noted over the midline consistent with a bobby pin. Patchy infiltrative changes are again seen bilaterally but predominately within the right lung no pneumothorax is seen. Small bilateral pleural effusions are again noted. IMPRESSION: Overall stable appearance of the chest when compared with the prior exam. Electronically Signed   By: Inez Catalina M.D.   On: 06/25/2017 07:39   Dg Chest Port 1 View  Result Date: 06/24/2017 CLINICAL DATA:  PICC line placement EXAM: PORTABLE CHEST 1 VIEW COMPARISON:  Earlier same day; 06/23/2017; 06/29/2017; 05/29/2017 FINDINGS: Grossly unchanged cardiac silhouette and mediastinal contours with atherosclerotic plaque within thoracic aorta.  Interval placement of right upper extremity approach PICC line with tip projected over the superior cavoatrial junction. Otherwise, stable position of support apparatus. No pneumothorax. Worsening nodular airspace opacities scattered within the right lung. Interval increase in trace/ small bilateral effusions and worsening bibasilar heterogeneous / consolidative opacities, left greater than right. Unchanged bones. IMPRESSION: 1. Right upper extremity approach PICC line tip projects over the superior cavoatrial junction. 2. Otherwise, stable positioning of support apparatus. No pneumothorax. 3. Worsening bilateral airspace opacities worrisome for the progression of multifocal infection, including atypical etiologies, though note, the nodular appearing airspace opacities within the right mid lung could be seen in the setting of septic emboli. Clinical correlation is advised. Electronically Signed   By: Sandi Mariscal M.D.   On: 06/24/2017 16:55   Dg Chest Port 1 View  Result Date: 06/24/2017 CLINICAL DATA:  Hypoxia EXAM: PORTABLE CHEST 1 VIEW COMPARISON:  June 23, 2017 FINDINGS: Endotracheal tube tip is 3.1 cm above the carina. Nasogastric tube tip and side port are below the diaphragm. No pneumothorax. There are pleural effusions bilaterally with patchy airspace opacity in both lower lobes. There is cardiomegaly with mild pulmonary venous hypertension. There is aortic atherosclerosis. No adenopathy. No bone lesions. IMPRESSION: Tubes as described without pneumothorax. Pleural effusions bilaterally with airspace opacity in both lower lobes, likely due primarily to atelectasis, although a degree of superimposed alveolar edema and/or pneumonia cannot be excluded in the lung bases. There is stable cardiomegaly with mild pulmonary venous hypertension. There is aortic atherosclerosis. Appearance is essentially stable compare to 1 day prior. Aortic Atherosclerosis (ICD10-I70.0). Electronically  Signed   By: Lowella Grip III M.D.   On: 06/24/2017 07:07   Dg Chest Port 1 View  Result Date: 06/23/2017 CLINICAL DATA:  Respiratory failure EXAM: PORTABLE CHEST 1 VIEW COMPARISON:  Chest radiograph 06/29/2017 FINDINGS: Worsened aeration of the right lung base, with possible veiling pleural effusion. The endotracheal tube has been retracted and is now in satisfactory position at the level of the clavicular heads. Orogastric tube side port is in the stomach. There is calcific aortic arch atherosclerosis. Cardiomediastinal contours are unchanged. Diffuse interstitial prominence remains. IMPRESSION: 1. Satisfactory position of endotracheal tube. 2. Worsening aeration of the right lung base with possible veiling pleural effusion. Electronically Signed   By: Ulyses Jarred M.D.   On: 06/23/2017 04:56   Dg Chest Portable 1 View  Result Date: 06/25/2017 CLINICAL DATA:  Intubation EXAM: PORTABLE CHEST 1 VIEW COMPARISON:  June 22, 2017 FINDINGS: The ETT terminates 17 mm above the carina. An OG tube terminates below today's film. Improving mild edema. Right greater than left pleural effusions with underlying opacities remain, unchanged. IMPRESSION: 1. The ETT terminates 17 mm above the carina. Recommend withdrawing 1 cm. 2. Improving edema. 3. Right greater than left pleural effusions with underlying opacities, similar in the interval. Electronically Signed   By: Dorise Bullion III M.D   On: 06/08/2017 13:34   Dg Chest Portable 1 View  Result Date: 06/03/2017 CLINICAL DATA:  Shortness of breath. EXAM: PORTABLE CHEST 1 VIEW COMPARISON:  May 29, 2017 FINDINGS: Cardiomegaly. Right greater than left pleural effusions with underlying opacities. Diffuse interstitial opacity. No other interval change. IMPRESSION: Findings are consistent with pulmonary edema. Bilateral pleural effusions with underlying opacities, right greater than left. The findings are similar in the interval. Electronically Signed   By: Dorise Bullion III  M.D   On: 06/08/2017 12:36   Dg Chest Port 1 View  Result Date: 05/29/2017 CLINICAL DATA:  Respiratory failure. EXAM: PORTABLE CHEST 1 VIEW COMPARISON:  05/28/2017. FINDINGS: Mediastinum hilar structures are normal. Cardiomegaly with bilateral from interstitial prominence and bilateral pleural effusions consistent CHF. No change from prior exam. IMPRESSION: Congestive heart failure pulmonary interstitial edema and bilateral pleural effusions. No significant change from prior exam. Electronically Signed   By: Marcello Moores  Register   On: 05/29/2017 09:26   Dg Chest Port 1 View  Result Date: 05/28/2017 CLINICAL DATA:  Respiratory failure EXAM: PORTABLE CHEST 1 VIEW COMPARISON:  05/26/2017 FINDINGS: Progression of heart failure with edema. Progression of bilateral effusions and bibasilar atelectasis. Cardiac enlargement. Left upper lobe linear density has improved. IMPRESSION: Progressive heart failure and edema. Progression of bilateral pleural effusions. Electronically Signed   By: Franchot Gallo M.D.   On: 05/28/2017 08:23   Dg Chest Portable 1 View  Result Date: 05/26/2017 CLINICAL DATA:  Difficulty breathing.  History of CHF. EXAM: PORTABLE CHEST 1 VIEW COMPARISON:  05/30/2016 and 03/29/2016 FINDINGS: Interstitial markings are prominent and raise concern for edema. There are densities at both lung bases which could represent volume loss and pleural fluid. Heart size appears to be upper limits of normal. Question increased densities along the medial left upper lung. Atherosclerotic calcifications at the aortic arch. Negative for a pneumothorax. IMPRESSION: Bibasilar densities are concerning for pleural fluid and basilar atelectasis. Prominent interstitial markings could represent mild edema. Questionable densities along the medial left upper lung. Recommend follow-up two view examination to better characterize this area. Electronically Signed   By: Markus Daft M.D.   On: 05/26/2017 16:47   Dg  Abd Portable  1 View  Result Date: 06/17/2017 CLINICAL DATA:  Orogastric tube placement EXAM: PORTABLE ABDOMEN - 1 VIEW COMPARISON:  February 16, 2015 FINDINGS: Orogastric tube tip and side port are in the stomach. There is moderate stool throughout the colon. There is no bowel dilatation or air-fluid level to suggest bowel obstruction. No free air. There is aortic atherosclerosis. There is a small left pleural effusion. IMPRESSION: Orogastric tube tip and side port in stomach. No bowel obstruction or free air. Moderate stool throughout colon. Small left pleural effusion. There is aortic atherosclerosis. Aortic Atherosclerosis (ICD10-I70.0). Electronically Signed   By: Lowella Grip III M.D.   On: 06/13/2017 13:33     CBC Recent Labs  Lab 06/11/2017 1215 06/23/17 0409 06/24/17 0645 06/25/17 0327  WBC 7.7 14.4* 22.5* 25.5*  HGB 10.9* 11.1* 10.1* 9.6*  HCT 35.0 35.3 31.3* 30.1*  PLT 101* 121* 104* 97*  MCV 92.6 91.3 89.9 89.7  MCH 28.9 28.6 29.0 28.7  MCHC 31.2* 31.3* 32.3 32.0  RDW 16.5* 16.2* 16.1* 16.8*  LYMPHSABS 0.7*  --   --   --   MONOABS 0.8  --   --   --   EOSABS 0.1  --   --   --   BASOSABS 0.0  --   --   --     Chemistries  Recent Labs  Lab 06/06/2017 1215 06/23/17 0409 06/24/17 0645 06/25/17 0327  NA 142 140 136 135  K 3.9 3.7 3.9 4.0  CL 86* 85* 95* 93*  CO2 46* 44* 33* 33*  GLUCOSE 136* 174* 181* 243*  BUN 23* 24* 45* 52*  CREATININE 1.31* 1.58* 1.36* 1.36*  CALCIUM 9.2 9.1 8.7* 8.9  AST  --   --  23  --   ALT  --   --  15  --   ALKPHOS  --   --  58  --   BILITOT  --   --  0.6  --    ------------------------------------------------------------------------------------------------------------------ estimated creatinine clearance is 26.3 mL/min (A) (by C-G formula based on SCr of 1.36 mg/dL (H)). ------------------------------------------------------------------------------------------------------------------ No results for input(s): HGBA1C in the last 72  hours. ------------------------------------------------------------------------------------------------------------------ No results for input(s): CHOL, HDL, LDLCALC, TRIG, CHOLHDL, LDLDIRECT in the last 72 hours. ------------------------------------------------------------------------------------------------------------------ No results for input(s): TSH, T4TOTAL, T3FREE, THYROIDAB in the last 72 hours.  Invalid input(s): FREET3 ------------------------------------------------------------------------------------------------------------------ No results for input(s): VITAMINB12, FOLATE, FERRITIN, TIBC, IRON, RETICCTPCT in the last 72 hours.  Coagulation profile No results for input(s): INR, PROTIME in the last 168 hours.  No results for input(s): DDIMER in the last 72 hours.  Cardiac Enzymes Recent Labs  Lab 06/04/2017 1626 06/29/2017 2232 06/23/17 0409  TROPONINI <0.03 0.04* 0.07*   ------------------------------------------------------------------------------------------------------------------ Invalid input(s): POCBNP    Assessment & Plan   Dana [name]  is a 83 y.o. female with a known history of chronic COPD, hypertension, chronic congestive heart failure on loop diuretics is brought into the ED via EMS for severe shortness of breath. Patient was waiting to see her primary care physician but as her shortness of breath has been gotten worse family decided to call EMS and patient is brought into the ED. Patient chronically lives on 3 L of oxygen but patient was extremity hypoxemic at 70% with a significant rales and wheezes. Patient Glasgow Coma Scale was at 35 by the time of arrival  #Acute encephalopathy secondary to severe hypercarbia Admitted to intensive care unit Patient is intubated Continue vent support and wean as  tolerated.  #Acute hypoxic and hypercarbic respiratory failure secondary to COPD exacerbation and CHF exacerbation Continue ventilator Continue  Solu-Medrol Continue Pulmicort IV Levaquin. Patient echocardiogram showed EF more than 60%.  Patient has acute on chronic diastolic heart failure.  #Acute COPD exacerbation SoluMedrol and bronchodilator therapy  Leukocytosis secondary to prednisone. #Severe metabolic alkalosis due to chronic hypercarbic respiratory failure and loop diuretic use with history of acute on chronic diuresis  #acute on  on chronic diastolic heart failure   #Hypotension On Levophed, continue pressors to keep map 60.  #Thrombocytopenia No active bleeding or bruising Follow closely, Prognosis poor, high risk for cardiac arrest, CODE STATUS DNR.             Consults  intensivitst  DVT Prophylaxis  scd's  Lab Results  Component Value Date   PLT 97 (L) 06/25/2017     Time Spent in minutes 35 minutes greater than 50% of time spent in care coordination and counseling patient regarding the condition and plan of care.   Epifanio Lesches M.D on 06/25/2017 at 4:09 PM  Between 7am to 6pm - Pager - [phone]  After 6pm go to [email or web address] - password [email or web address] [facility]   Office  [phone]

## 2017-06-26 ENCOUNTER — Inpatient Hospital Stay: Payer: Medicare Other

## 2017-06-26 LAB — BASIC METABOLIC PANEL
Anion gap: 5 (ref 5–15)
BUN: 52 mg/dL — ABNORMAL HIGH (ref 6–20)
CALCIUM: 8.7 mg/dL — AB (ref 8.9–10.3)
CHLORIDE: 104 mmol/L (ref 101–111)
CO2: 33 mmol/L — ABNORMAL HIGH (ref 22–32)
CREATININE: 1.08 mg/dL — AB (ref 0.44–1.00)
GFR, EST AFRICAN AMERICAN: 54 mL/min — AB (ref >60)
GFR, EST NON AFRICAN AMERICAN: 46 mL/min — AB (ref >60)
Glucose, Bld: 114 mg/dL — ABNORMAL HIGH (ref 65–99)
Potassium: 3.6 mmol/L (ref 3.5–5.1)
SODIUM: 142 mmol/L (ref 135–145)

## 2017-06-26 LAB — CBC
HCT: 25.8 % — ABNORMAL LOW (ref 35.0–47.0)
Hemoglobin: 8.3 g/dL — ABNORMAL LOW (ref 12.0–16.0)
MCH: 28.9 pg (ref 26.0–34.0)
MCHC: 32.3 g/dL (ref 32.0–36.0)
MCV: 89.4 fL (ref 80.0–100.0)
PLATELETS: 72 10*3/uL — AB (ref 150–440)
RBC: 2.89 MIL/uL — AB (ref 3.80–5.20)
RDW: 16.9 % — AB (ref 11.5–14.5)
WBC: 15.2 10*3/uL — AB (ref 3.6–11.0)

## 2017-06-26 LAB — CULTURE, RESPIRATORY W GRAM STAIN

## 2017-06-26 LAB — GLUCOSE, CAPILLARY
GLUCOSE-CAPILLARY: 119 mg/dL — AB (ref 65–99)
GLUCOSE-CAPILLARY: 169 mg/dL — AB (ref 65–99)
Glucose-Capillary: 113 mg/dL — ABNORMAL HIGH (ref 65–99)
Glucose-Capillary: 122 mg/dL — ABNORMAL HIGH (ref 65–99)
Glucose-Capillary: 112 mg/dL — ABNORMAL HIGH (ref 65–99)
Glucose-Capillary: 145 mg/dL — ABNORMAL HIGH (ref 65–99)
Glucose-Capillary: 146 mg/dL — ABNORMAL HIGH (ref 65–99)

## 2017-06-26 LAB — CULTURE, RESPIRATORY

## 2017-06-26 MED ORDER — MIDAZOLAM HCL 2 MG/2ML IJ SOLN
2.0000 mg | INTRAMUSCULAR | Status: DC | PRN
Start: 2017-06-26 — End: 2017-07-01
  Administered 2017-07-01: 2 mg via INTRAVENOUS
  Filled 2017-06-26: qty 2

## 2017-06-26 MED ORDER — AMIODARONE HCL 200 MG PO TABS
200.0000 mg | ORAL_TABLET | Freq: Every day | ORAL | Status: DC
  Administered 2017-06-27 – 2017-06-30 (×4): 200 mg
  Filled 2017-06-26 (×4): qty 1

## 2017-06-26 MED ORDER — FENTANYL CITRATE (PF) 100 MCG/2ML IJ SOLN
50.0000 ug | INTRAMUSCULAR | Status: DC | PRN
Start: 2017-06-26 — End: 2017-07-01
  Administered 2017-06-28 – 2017-06-29 (×3): 50 ug via INTRAVENOUS
  Filled 2017-06-26 (×3): qty 2

## 2017-06-26 MED ORDER — POTASSIUM CHLORIDE 20 MEQ/15ML (10%) PO SOLN
40.0000 meq | Freq: Once | ORAL | Status: AC
  Administered 2017-06-26: 40 meq
  Filled 2017-06-26: qty 30

## 2017-06-26 MED ORDER — DEXTROSE 5 % IV SOLN
2.0000 g | Freq: Two times a day (BID) | INTRAVENOUS | Status: DC
  Administered 2017-06-26 – 2017-07-01 (×10): 2 g via INTRAVENOUS
  Filled 2017-06-26 (×11): qty 2

## 2017-06-26 NOTE — Progress Notes (Signed)
Pharmacy Antibiotic Note  Dana [name] is a 83 y.o. female admitted on 06/26/2017 with end-stage COPD.  Pharmacy has been consulted for Levaquin dosing. Patient previously on doxycycline with respiratory culture now growing Pseudomonas.   Plan: Will change cefepime dosing to 2 g iv q 12 hours.   Height: 4\' 9"  (144.8 cm) Weight: 160 lb 4.4 oz (72.7 kg) IBW/kg (Calculated) : 38.6  Temp (24hrs), Avg:100 F (37.8 C), Min:99.7 F (37.6 C), Max:100.6 F (38.1 C)  Recent Labs  Lab 06/04/2017 1215 06/23/17 0409 06/24/17 0645 06/25/17 0327 06/26/17 0457  WBC 7.7 14.4* 22.5* 25.5* 15.2*  CREATININE 1.31* 1.58* 1.36* 1.36* 1.08*    Estimated Creatinine Clearance: 33.1 mL/min (A) (by C-G formula based on SCr of 1.08 mg/dL (H)).    No Known Allergies  Antimicrobials this admission: doxycycline 12/24 >> 12/26 Levaqin 12/26 x 1 Cefepime 12/26 >>  Dose adjustments this admission: Cefepime 2 g iv q 24 h >> q 12 h  Microbiology results: 12/25 BCx: NGTD 12/25 UCx: NG 12/24 Sputum: Pseudomonas aeruginosa  12/25 MRSA PCR: negative  Thank you for allowing pharmacy to be a part of this patient's care.  Luisa Harthristy, Mariam Helbert D, Pharm.D., BCPS Clinical Pharmacist 06/26/2017 2:22 PM

## 2017-06-26 NOTE — Progress Notes (Signed)
Patient became very anxious and agitated once sedation weaned, RR increased to the 40's, and saturations decreased to 87%, resumed previous PRVC settings, RN Sherril CroonSandra Aaware.

## 2017-06-26 NOTE — Progress Notes (Addendum)
Daily Progress Note   Patient Name: Dana [name]       Date: 06/26/2017 DOB: 05-12-1933  Age: 83 y.o. MRN#: [id number] Attending Physician: Epifanio Lesches, MD Primary Care Physician: Jene Every, MD Admit Date: 06/18/2017  Reason for Consultation/Follow-up: Establishing goals of care  Subjective: Dana [name] is resting in bed intubated.  Daughter at bedside. Patient with sedation medication just being held at entry into room, RT at bedside. She is now relaxed with no agitation, unable to wean vent. She returned hello hand wave. Attempted to speak with patient about her wishes via requests for head nodding or hand squeezing but unreliable responses. Encouraged daughter to try communicating with hand squeezes and potentially writing or pointing to yes or no on a piece of paper to facilitate them talking about management.     MOST form with only code status for DNR checked as we are continuing full care at this time.   Length of Stay: 4  Current Medications: Scheduled Meds:  . amiodarone  400 mg Per Tube BID  . aspirin  81 mg Per Tube Daily  . budesonide (PULMICORT) nebulizer solution  0.25 mg Nebulization Q6H  . chlorhexidine gluconate (MEDLINE KIT)  15 mL Mouth Rinse BID  . famotidine  20 mg Per Tube Daily  . heparin injection (subcutaneous)  5,000 Units Subcutaneous Q8H  . insulin aspart  0-15 Units Subcutaneous Q4H  . insulin glargine  10 Units Subcutaneous Daily  . ipratropium-albuterol  3 mL Nebulization Q6H  . mouth rinse  15 mL Mouth Rinse QID  . multivitamin  15 mL Per Tube Daily  . predniSONE  20 mg Per Tube Daily  . sodium chloride flush  10-40 mL Intracatheter Q12H  . sodium chloride flush  3 mL Intravenous Q12H    Continuous Infusions: . sodium chloride    .  ceFEPime (MAXIPIME) IV Stopped (06/25/17 1709)  . feeding supplement (VITAL HIGH PROTEIN) 1,000 mL (06/26/17 0408)  . phenylephrine (NEO-SYNEPHRINE) Adult infusion 50 mcg/min (06/26/17 0819)  . propofol (DIPRIVAN) infusion 15 mcg/kg/min (06/26/17 0509)    PRN Meds: sodium chloride, acetaminophen, albuterol, fentaNYL (SUBLIMAZE) injection, ondansetron (ZOFRAN) IV, sodium chloride flush, sodium chloride flush  Physical Exam  Constitutional: No distress.  Pulmonary/Chest:  Intubated  Neurological: She is alert.  Skin: Skin is warm and dry.  Vital Signs: BP 111/73   Pulse (!) 118   Temp (!) 100.6 F (38.1 C)   Resp 16   Ht 4' 9"  (1.448 m)   Wt 72.7 kg (160 lb 4.4 oz)   SpO2 (!) 87%   BMI 34.68 kg/m  SpO2: SpO2: (!) 87 % O2 Device: O2 Device: Ventilator O2 Flow Rate: O2 Flow Rate (L/min): 6 L/min  Intake/output summary:   Intake/Output Summary (Last 24 hours) at 06/26/2017 0935 Last data filed at 06/26/2017 0745 Gross per 24 hour  Intake 2453.53 ml  Output 2075 ml  Net 378.53 ml   LBM: Last BM Date: 06/21/17 Baseline Weight: Weight: 68 kg (150 lb) Most recent weight: Weight: 72.7 kg (160 lb 4.4 oz)       Palliative Assessment/Data: 10%      Patient Active Problem List   Diagnosis Date Noted  . Acute respiratory failure with hypoxia ([place]) 06/04/2017  . DNR (do not resuscitate) discussion   . Palliative care by specialist   . COPD exacerbation ([place])   . Sepsis ([place]) 05/26/2017  . Acute respiratory failure with hypoxia and hypercarbia ([facility])   . Acute on chronic congestive heart failure ([place])   . Respiratory failure ([place]) 03/24/2016  . CAP (community acquired pneumonia)   . Demand ischemia [facility])     Palliative Care Assessment & Plan   Patient Profile:  48 F withend-stage COPD intubated in the ED for respiratory failure and hypersomnolence. Recently discharged from [facility] after hospitalization for the same. During prior hospitalization, did not  require intubation but did require noninvasive ventilation for a couple of days.     Assessment: Sedation discontinued currently.   Recommendations/Plan:  Family to discuss extubation, and hopefully now include patient.     Code Status:    Code Status Orders  (From admission, onward)        Start     Ordered   06/25/17 1151  Do not attempt resuscitation (DNR)  Continuous    Question Answer Comment  In the event of cardiac or respiratory ARREST Do not call a "code blue"   In the event of cardiac or respiratory ARREST Do not perform Intubation, CPR, defibrillation or ACLS   In the event of cardiac or respiratory ARREST Use medication by any route, position, wound care, and other measures to relive pain and suffering. May use oxygen, suction and manual treatment of airway obstruction as needed for comfort.      06/25/17 1150    Code Status History    Date Active Date Inactive Code Status Order ID Comments User Context   06/09/2017 16:11 06/25/2017 11:50 Full Code [id number]  Nicholes Mango, MD Inpatient   05/26/2017 20:34 06/01/2017 18:21 Full Code [id number]  Harrie Foreman, MD Inpatient   03/24/2016 16:47 04/01/2016 20:42 Full Code [id number]  de Dios, Mike Gip, MD Inpatient       Prognosis:   < 2 weeks Depending on how she does with discontinuation from ventilator.   Discharge Planning:  To Be Determined   Thank you for allowing the Palliative Medicine Team to assist in the care of this patient.   Total Time 35 min Prolonged Time Billed No  Per nursing, family running late for meeting at bedside.     Greater than 50%  of this time was spent counseling and coordinating care related to the above assessment and plan.  Asencion Gowda, NP  Please contact Palliative Medicine Team phone at [phone] for questions and concerns.

## 2017-06-26 NOTE — Progress Notes (Signed)
PULMONARY / CRITICAL CARE MEDICINE   Name: Dana [name] MRN: [id number] DOB: 10/11/1932    ADMISSION DATE:  06/20/2017 CONSULTATION DATE: 05/30/2017  REFERRING MD:  Amado CoeGouru  PT PROFILE: [age] F with end-stage COPD intubated in the ED for acute/chronic hypoxemic/hypercarbic respiratory failure and hypersomnolence.  [facility] for same.  During that hospitalization, did not require intubation but did require noninvasive ventilation for a couple of days.  MAJOR EVENTS/TEST RESULTS: 12/24 Admitted 12/25 Mild hypotension due to sedation. Low dose NE initiated. Pulmonary exam improved - weaning in PSV mode initiated 12/26 Failed SBT. CXR c/w PNA. Resp culture + for abundant pseudomonas. Abx adjusted 12/27 Palliative Care consultation: Made DNR 12/27 No progress weaning.  Developed AF with RVR.  Received amiodarone bolus and enteral amiodarone dose increased 12/28 no progress weaning.  Back in NSR.  Remains on low-dose phenylephrine  INDWELLING DEVICES:: ETT 12/24 >>   MICRO DATA: Urine 12/24  >> NEG Resp 12/24 >> abundant Pseudomonas (pansensitive) Blood 12/25 >>   ANTIMICROBIALS:  Doxycycline 12/24 >> 12/26 Levofloxacin 12/26 X 1 Cefepime 12/26 >>   SUBJECTIVE:  RASS -3 on propofol. Not F/C. Failed SBT  VITAL SIGNS: BP (!) 104/52   Pulse 70   Temp (!) 100.6 F (38.1 C)   Resp 16   Ht 4\' 9"  (1.448 m)   Wt 72.7 kg (160 lb 4.4 oz)   SpO2 95%   BMI 34.68 kg/m   HEMODYNAMICS:    VENTILATOR SETTINGS: Vent Mode: PRVC FiO2 (%):  [30 %-40 %] 35 % Set Rate:  [15 bmp] 15 bmp Vt Set:  [450 mL] 450 mL PEEP:  [5 cmH20] 5 cmH20 Pressure Support:  [12 cmH20-18 cmH20] 12 cmH20 Plateau Pressure:  [23 cmH20] 23 cmH20  INTAKE / OUTPUT: I/O last 3 completed shifts: In: 3526.5 [I.V.:1776.5; NG/GT:1650; IV Piggyback:100] Out: 3425 [Urine:3425]  PHYSICAL EXAMINATION: General: Intubated, sedated Neuro: CNs intact, moves all  extremities HEENT: NCAT, sclerae white Cardiovascular: Regular, no M noted Lungs: No wheezes.  Moderate mucopurulent secretions Abdomen: Obese, diminished bowel sounds, no palpable masses Extremities: Mild symmetric LE edema   LABS:  BMET Recent Labs  Lab 06/24/17 0645 06/25/17 0327 06/26/17 0457  NA 136 135 142  K 3.9 4.0 3.6  CL 95* 93* 104  CO2 33* 33* 33*  BUN 45* 52* 52*  CREATININE 1.36* 1.36* 1.08*  GLUCOSE 181* 243* 114*    Electrolytes Recent Labs  Lab 06/24/17 0645 06/25/17 0327 06/26/17 0457  CALCIUM 8.7* 8.9 8.7*    CBC Recent Labs  Lab 06/24/17 0645 06/25/17 0327 06/26/17 0457  WBC 22.5* 25.5* 15.2*  HGB 10.1* 9.6* 8.3*  HCT 31.3* 30.1* 25.8*  PLT 104* 97* 72*    Coag's No results for input(s): APTT, INR in the last 168 hours.  Sepsis Markers Recent Labs  Lab 06/23/17 0028 06/24/17 0645 06/25/17 0327  PROCALCITON <0.10 1.23 5.44    ABG Recent Labs  Lab 06/16/2017 1406  PHART 7.46*  PCO2ART 80*  PO2ART 68*    Liver Enzymes Recent Labs  Lab 06/24/17 0645  AST 23  ALT 15  ALKPHOS 58  BILITOT 0.6  ALBUMIN 2.6*    Cardiac Enzymes Recent Labs  Lab 06/08/2017 1626 06/24/2017 2232 06/23/17 0409  TROPONINI <0.03 0.04* 0.07*    Glucose Recent Labs  Lab 06/25/17 1644 06/25/17 1952 06/26/17 0053 06/26/17 0322 06/26/17 0746 06/26/17 1133  GLUCAP 150* 138* 119* 113* 122* 112*    CXR: NSC  ASSESSMENT / PLAN:  PULMONARY A: Acute/chronic hypercarbic/hypoxemic respiratory failure End-stage COPD with acute exacerbation Pseudomonas HCAP P:   Cont vent support - settings reviewed and/or adjusted PSV weaning as tolerated Cont vent bundle Daily SBT if/when meets criteria Cont nebulized steroids and bronchodilators Taper systemic steroids over next 3-5 days  CARDIOVASCULAR A:  History of diastolic heart failure Probable pulmonary hypertension Hypotension due to sedation Paroxysmal atrial fibrillation P:  Cont  amiodarone at increased dose (400 mg twice daily) Continue phenylephrine to maintain MAP >65 mmHg DNR in event of cardiac arrest  RENAL A:   Severe metabolic alkalosis, improved Hypervolemia P:   Monitor BMET intermittently Monitor I/Os Correct electrolytes as indicated   GASTROINTESTINAL A:   Obesity P:   SUP: IV famotidine Continue TF protocol - initiated 12/25  HEMATOLOGIC A:   Chronic anemia Thrombocytopenia - SQ heparin DC'd 12/28 P:  DVT px: SCDs Monitor CBC intermittently Transfuse per usual guidelines   INFECTIOUS A:   Pseudomonas HCAP Elevated PCT Severe sepsis P:   Monitor temp, WBC count Micro and abx as above   ENDOCRINE A:   Mild hyperglycemia without dx of DM P:   Cont SSI while on systemic steroids Lantus initiated 12/25  NEUROLOGIC A:   Acute encephalopathy ICU/ventilator associated discomfort Poor baseline functional status P:   RASS goal: -1, -2 PAD protocol -propofol, intermittent fentanyl Per goals of care discussion, continue support for now. Possible terminal extubation next week if not improving    FAMILY  - Updates: daughter updated @ bedside   CCM time: 35 mins The above time includes time spent in consultation with patient and/or family members and reviewing care plan on multidisciplinary rounds  Billy Fischeravid Levern Pitter, MD PCCM service Mobile [phone] Pager [phone] 06/26/2017, 1:36 PM

## 2017-06-26 NOTE — Progress Notes (Signed)
Titrated propofol off this am. Patient resting well throughout the day. Unable to tolerate spontaneous mode, placed back on rate. Patient able to follow simple commands. Tolerating tube feeds, adequate urine output. Titrating Neo as patient tolerates. Plan is to allow patient through Monday/ possible terminal extubation if not improving.

## 2017-06-26 NOTE — Progress Notes (Signed)
[place] at [facility]                                                                                                                                                                                  Patient Demographics   Javiana Anwar, is a 83 y.o. female, DOB - [birth year], IRJ:[id number]  Admit date - 06/10/2017   Admitting Physician Nicholes Mango, MD  Outpatient Primary MD for the patient is Jene Every, MD   LOS - 4  Subjective: On full vent support with pressors.   Review of Systems:   CONSTITUTIONAL: Intubated  Vitals:   Vitals:   06/26/17 1300 06/26/17 1330 06/26/17 1400 06/26/17 1505  BP: (!) 104/52 (!) 112/50 (!) 109/58   Pulse: 70 73 78   Resp: _0 Temp:      TempSrc:      SpO2: 95% 96% 96% 96%  Weight:      Height:        Wt Readings from Last 3 Encounters:  06/29/2017 72.7 kg (160 lb 4.4 oz)  05/31/17 65.3 kg (143 lb 14.4 oz)  04/01/16 51.7 kg (114 lb)     Intake/Output Summary (Last 24 hours) at 06/26/2017 1548 Last data filed at 06/26/2017 1300 Gross per 24 hour  Intake 1705.66 ml  Output 2125 ml  Net -419.34 ml  Review of systems unobtainable because of intubation and sedation.  Physical Exam:   GENERAL: Intubated, critically ill HEAD, EYES, EARS, NOSE AND THROAT: Atraumatic, normocephalic.  Pupils equal and reactive to light. Sclerae anicteric. No conjunctival injection. No oro-pharyngeal erythema.  Orally intubated. NECK: Supple. There is no jugular venous distention. No bruits, no lymphadenopathy, no thyromegaly.  HEART: Regular rate and rhythm,. No murmurs, no rubs, no clicks.  LUNGS: Decreased breath sounds on the ventilator ABDOMEN: Soft, flat, nontender, nondistended. Has good bowel sounds. No hepatosplenomegaly appreciated.  EXTREMITIES: No evidence of any cyanosis, clubbing, or peripheral edema.  +2 pedal and radial pulses bilaterally.  NEUROLOGIC:sedated SKIN: Moist and warm with no rashes  appreciated.  Psych: Sedated  LN: No inguinal LN enlargement    Antibiotics   Anti-infectives (From admission, onward)   Start     Dose/Rate Route Frequency Ordered Stop   06/26/17 1500  ceFEPIme (MAXIPIME) 2 g in dextrose 5 % 50 mL IVPB     2 g 100 mL/hr over 30 Minutes Intravenous Every 12 hours 06/26/17 1427     06/26/17 1000  levofloxacin (LEVAQUIN) IVPB 750 mg  Status:  Discontinued     750 mg 100 mL/hr over 90 Minutes Intravenous Every 48 hours 06/24/17 1142 06/24/17 1551   06/24/17 1600  ceFEPIme (MAXIPIME) 1 g in dextrose 5 % 50 mL IVPB  Status:  Discontinued     1 g 100 mL/hr over 30 Minutes Intravenous Every 24 hours 06/24/17 1555 06/24/17 1557   06/24/17 1600  ceFEPIme (MAXIPIME) 2 g in dextrose 5 % 50 mL IVPB  Status:  Discontinued     2 g 100 mL/hr over 30 Minutes Intravenous Every 24 hours 06/24/17 1557 06/26/17 1427   06/24/17 1300  levofloxacin (LEVAQUIN) IVPB 750 mg     750 mg 100 mL/hr over 90 Minutes Intravenous  Once 06/24/17 1142 06/24/17 1459   06/21/2017 1630  doxycycline (VIBRAMYCIN) 100 mg in dextrose 5 % 250 mL IVPB  Status:  Discontinued     100 mg 125 mL/hr over 120 Minutes Intravenous Every 12 hours 06/11/2017 1501 06/24/17 1029      Medications   Scheduled Meds: . [START ON 06/27/2017] amiodarone  200 mg Per Tube Daily  . aspirin  81 mg Per Tube Daily  . budesonide (PULMICORT) nebulizer solution  0.25 mg Nebulization Q6H  . chlorhexidine gluconate (MEDLINE KIT)  15 mL Mouth Rinse BID  . famotidine  20 mg Per Tube Daily  . insulin aspart  0-15 Units Subcutaneous Q4H  . insulin glargine  10 Units Subcutaneous Daily  . ipratropium-albuterol  3 mL Nebulization Q6H  . mouth rinse  15 mL Mouth Rinse QID  . multivitamin  15 mL Per Tube Daily  . predniSONE  20 mg Per Tube Daily  . sodium chloride flush  10-40 mL Intracatheter Q12H  . sodium chloride flush  3 mL Intravenous Q12H   Continuous Infusions: . sodium chloride    . ceFEPime (MAXIPIME) IV 2 g  (06/26/17 1545)  . feeding supplement (VITAL HIGH PROTEIN) 1,000 mL (06/26/17 0408)  . phenylephrine (NEO-SYNEPHRINE) Adult infusion 55 mcg/min (06/26/17 1038)   PRN Meds:.sodium chloride, acetaminophen, albuterol, fentaNYL (SUBLIMAZE) injection, midazolam, ondansetron (ZOFRAN) IV, sodium chloride flush, sodium chloride flush   Data Review:   Micro Results Recent Results (from the past 240 hour(s))  Culture, respiratory (NON-Expectorated)     Status: None   Collection Time: 06/17/2017  2:56 PM  Result Value Ref Range Status   Specimen Description   Final    TRACHEAL ASPIRATE Performed at [facility], [address] [place], [place] [address]    Special Requests   Final    NONE Performed at [facility], [address], [place], [place] [address]    Gram Stain   Final    ABUNDANT WBC PRESENT,BOTH PMN AND MONONUCLEAR RARE GRAM POSITIVE COCCI Performed at [facility], [place] [address] [place], [place] [address]    Culture ABUNDANT PSEUDOMONAS AERUGINOSA  Final   Report Status 06/26/2017 FINAL  Final   Organism ID, Bacteria PSEUDOMONAS AERUGINOSA  Final      Susceptibility   Pseudomonas aeruginosa - MIC*    CEFTAZIDIME 4 SENSITIVE Sensitive     CIPROFLOXACIN <=0.25 SENSITIVE Sensitive     GENTAMICIN <=1 SENSITIVE Sensitive     IMIPENEM <=0.25 SENSITIVE Sensitive     PIP/TAZO <=4 SENSITIVE Sensitive     CEFEPIME 2 SENSITIVE Sensitive     * ABUNDANT PSEUDOMONAS AERUGINOSA  CULTURE, BLOOD (ROUTINE X 2) w Reflex to ID Panel     Status: None (Preliminary result)   Collection Time: 06/23/17 12:28 AM  Result Value Ref Range Status   Specimen Description BLOOD LEFT FOREARM  Final   Special Requests   Final  BOTTLES DRAWN AEROBIC AND ANAEROBIC Blood Culture adequate volume   Culture   Final    NO GROWTH 3 DAYS Performed at [facility], [address] [place], [place] [address]    Report Status PENDING  Incomplete  CULTURE, BLOOD (ROUTINE X  2) w Reflex to ID Panel     Status: None (Preliminary result)   Collection Time: 06/23/17 12:36 AM  Result Value Ref Range Status   Specimen Description BLOOD LEFT HAND  Final   Special Requests   Final    BOTTLES DRAWN AEROBIC AND ANAEROBIC Blood Culture adequate volume   Culture   Final    NO GROWTH 3 DAYS Performed at [facility], [address], [place], [place] [address]    Report Status PENDING  Incomplete  Urine Culture     Status: None   Collection Time: 06/23/17 12:45 AM  Result Value Ref Range Status   Specimen Description   Final    URINE, RANDOM Performed at [facility], [address] [place], [place] [address]    Special Requests   Final    NONE Performed at [facility], [address], [place], [place] [address]    Culture   Final    NO GROWTH Performed at [facility], [place] [address] [place], [place] [address]    Report Status 06/24/2017 FINAL  Final  MRSA PCR Screening     Status: None   Collection Time: 06/23/17  7:38 PM  Result Value Ref Range Status   MRSA by PCR NEGATIVE NEGATIVE Final    Comment:        The GeneXpert MRSA Assay (FDA approved for NASAL specimens only), is one component of a comprehensive MRSA colonization surveillance program. It is not intended to diagnose MRSA infection nor to guide or monitor treatment for MRSA infections. Performed at [facility], [address] [place], [place] [address]     Radiology Reports Dg Chest [place] 1 View  Result Date: 06/26/2017 CLINICAL DATA:  Respiratory failure. EXAM: PORTABLE CHEST 1 VIEW COMPARISON:  One-view chest x-ray 06/25/2017 FINDINGS: Heart is exaggerated by low lung volumes. Endotracheal tube terminates 3.5 cm above the carina, advanced since the prior exam. NG tube courses off the inferior border of the film. A right-sided PICC line is in place. Asymmetric right-sided interstitial and airspace disease continues to progress. Bibasilar  airspace disease and effusions are unchanged. IMPRESSION: 1. Progressive right-sided interstitial and airspace disease representing a combination of edema and pneumonia. 2. Bilateral pleural effusions and basilar airspace disease is otherwise unchanged. 3. Stable cardiomegaly. 4. Endotracheal tube has been advanced, still 3.5 cm above the carina. Electronically Signed   By: San Morelle M.D.   On: 06/26/2017 07:05   Dg Chest Port 1 View  Result Date: 06/25/2017 CLINICAL DATA:  Respiratory failure EXAM: PORTABLE CHEST 1 VIEW COMPARISON:  06/24/2017 FINDINGS: Cardiac shadow is stable. Endotracheal tube, nasogastric catheter and right-sided PICC line are noted in satisfactory position. Extrinsic artifact is noted over the midline consistent with a bobby pin. Patchy infiltrative changes are again seen bilaterally but predominately within the right lung no pneumothorax is seen. Small bilateral pleural effusions are again noted. IMPRESSION: Overall stable appearance of the chest when compared with the prior exam. Electronically Signed   By: Inez Catalina M.D.   On: 06/25/2017 07:39   Dg Chest Port 1 View  Result Date: 06/24/2017 CLINICAL DATA:  PICC line placement EXAM: PORTABLE CHEST 1 VIEW  COMPARISON:  Earlier same day; 06/23/2017; 06/27/2017; 05/29/2017 FINDINGS: Grossly unchanged cardiac silhouette and mediastinal contours with atherosclerotic plaque within thoracic aorta. Interval placement of right upper extremity approach PICC line with tip projected over the superior cavoatrial junction. Otherwise, stable position of support apparatus. No pneumothorax. Worsening nodular airspace opacities scattered within the right lung. Interval increase in trace/ small bilateral effusions and worsening bibasilar heterogeneous / consolidative opacities, left greater than right. Unchanged bones. IMPRESSION: 1. Right upper extremity approach PICC line tip projects over the superior cavoatrial junction. 2.  Otherwise, stable positioning of support apparatus. No pneumothorax. 3. Worsening bilateral airspace opacities worrisome for the progression of multifocal infection, including atypical etiologies, though note, the nodular appearing airspace opacities within the right mid lung could be seen in the setting of septic emboli. Clinical correlation is advised. Electronically Signed   By: Sandi Mariscal M.D.   On: 06/24/2017 16:55   Dg Chest Port 1 View  Result Date: 06/24/2017 CLINICAL DATA:  Hypoxia EXAM: PORTABLE CHEST 1 VIEW COMPARISON:  June 23, 2017 FINDINGS: Endotracheal tube tip is 3.1 cm above the carina. Nasogastric tube tip and side port are below the diaphragm. No pneumothorax. There are pleural effusions bilaterally with patchy airspace opacity in both lower lobes. There is cardiomegaly with mild pulmonary venous hypertension. There is aortic atherosclerosis. No adenopathy. No bone lesions. IMPRESSION: Tubes as described without pneumothorax. Pleural effusions bilaterally with airspace opacity in both lower lobes, likely due primarily to atelectasis, although a degree of superimposed alveolar edema and/or pneumonia cannot be excluded in the lung bases. There is stable cardiomegaly with mild pulmonary venous hypertension. There is aortic atherosclerosis. Appearance is essentially stable compare to 1 day prior. Aortic Atherosclerosis (ICD10-I70.0). Electronically Signed   By: Lowella Grip III M.D.   On: 06/24/2017 07:07   Dg Chest Port 1 View  Result Date: 06/23/2017 CLINICAL DATA:  Respiratory failure EXAM: PORTABLE CHEST 1 VIEW COMPARISON:  Chest radiograph 06/18/2017 FINDINGS: Worsened aeration of the right lung base, with possible veiling pleural effusion. The endotracheal tube has been retracted and is now in satisfactory position at the level of the clavicular heads. Orogastric tube side port is in the stomach. There is calcific aortic arch atherosclerosis. Cardiomediastinal contours are  unchanged. Diffuse interstitial prominence remains. IMPRESSION: 1. Satisfactory position of endotracheal tube. 2. Worsening aeration of the right lung base with possible veiling pleural effusion. Electronically Signed   By: Ulyses Jarred M.D.   On: 06/23/2017 04:56   Dg Chest Portable 1 View  Result Date: 06/01/2017 CLINICAL DATA:  Intubation EXAM: PORTABLE CHEST 1 VIEW COMPARISON:  June 22, 2017 FINDINGS: The ETT terminates 17 mm above the carina. An OG tube terminates below today's film. Improving mild edema. Right greater than left pleural effusions with underlying opacities remain, unchanged. IMPRESSION: 1. The ETT terminates 17 mm above the carina. Recommend withdrawing 1 cm. 2. Improving edema. 3. Right greater than left pleural effusions with underlying opacities, similar in the interval. Electronically Signed   By: Dorise Bullion III M.D   On: 06/07/2017 13:34   Dg Chest Portable 1 View  Result Date: 05/31/2017 CLINICAL DATA:  Shortness of breath. EXAM: PORTABLE CHEST 1 VIEW COMPARISON:  May 29, 2017 FINDINGS: Cardiomegaly. Right greater than left pleural effusions with underlying opacities. Diffuse interstitial opacity. No other interval change. IMPRESSION: Findings are consistent with pulmonary edema. Bilateral pleural effusions with underlying opacities, right greater than left. The findings are similar in the interval. Electronically Signed   By: Shanon Brow  Mee Hives M.D   On: 06/21/2017 12:36   Dg Chest Port 1 View  Result Date: 05/29/2017 CLINICAL DATA:  Respiratory failure. EXAM: PORTABLE CHEST 1 VIEW COMPARISON:  05/28/2017. FINDINGS: Mediastinum hilar structures are normal. Cardiomegaly with bilateral from interstitial prominence and bilateral pleural effusions consistent CHF. No change from prior exam. IMPRESSION: Congestive heart failure pulmonary interstitial edema and bilateral pleural effusions. No significant change from prior exam. Electronically Signed   By: Marcello Moores   Register   On: 05/29/2017 09:26   Dg Chest Port 1 View  Result Date: 05/28/2017 CLINICAL DATA:  Respiratory failure EXAM: PORTABLE CHEST 1 VIEW COMPARISON:  05/26/2017 FINDINGS: Progression of heart failure with edema. Progression of bilateral effusions and bibasilar atelectasis. Cardiac enlargement. Left upper lobe linear density has improved. IMPRESSION: Progressive heart failure and edema. Progression of bilateral pleural effusions. Electronically Signed   By: Franchot Gallo M.D.   On: 05/28/2017 08:23   Dg Abd Portable 1 View  Result Date: 06/03/2017 CLINICAL DATA:  Orogastric tube placement EXAM: PORTABLE ABDOMEN - 1 VIEW COMPARISON:  February 16, 2015 FINDINGS: Orogastric tube tip and side port are in the stomach. There is moderate stool throughout the colon. There is no bowel dilatation or air-fluid level to suggest bowel obstruction. No free air. There is aortic atherosclerosis. There is a small left pleural effusion. IMPRESSION: Orogastric tube tip and side port in stomach. No bowel obstruction or free air. Moderate stool throughout colon. Small left pleural effusion. There is aortic atherosclerosis. Aortic Atherosclerosis (ICD10-I70.0). Electronically Signed   By: Lowella Grip III M.D.   On: 06/20/2017 13:33     CBC Recent Labs  Lab 06/04/2017 1215 06/23/17 0409 06/24/17 0645 06/25/17 0327 06/26/17 0457  WBC 7.7 14.4* 22.5* 25.5* 15.2*  HGB 10.9* 11.1* 10.1* 9.6* 8.3*  HCT 35.0 35.3 31.3* 30.1* 25.8*  PLT 101* 121* 104* 97* 72*  MCV 92.6 91.3 89.9 89.7 89.4  MCH 28.9 28.6 29.0 28.7 28.9  MCHC 31.2* 31.3* 32.3 32.0 32.3  RDW 16.5* 16.2* 16.1* 16.8* 16.9*  LYMPHSABS 0.7*  --   --   --   --   MONOABS 0.8  --   --   --   --   EOSABS 0.1  --   --   --   --   BASOSABS 0.0  --   --   --   --     Chemistries  Recent Labs  Lab 05/31/2017 1215 06/23/17 0409 06/24/17 0645 06/25/17 0327 06/26/17 0457  NA 142 140 136 135 142  K 3.9 3.7 3.9 4.0 3.6  CL 86* 85* 95* 93* 104   CO2 46* 44* 33* 33* 33*  GLUCOSE 136* 174* 181* 243* 114*  BUN 23* 24* 45* 52* 52*  CREATININE 1.31* 1.58* 1.36* 1.36* 1.08*  CALCIUM 9.2 9.1 8.7* 8.9 8.7*  AST  --   --  23  --   --   ALT  --   --  15  --   --   ALKPHOS  --   --  58  --   --   BILITOT  --   --  0.6  --   --    ------------------------------------------------------------------------------------------------------------------ estimated creatinine clearance is 33.1 mL/min (A) (by C-G formula based on SCr of 1.08 mg/dL (H)). ------------------------------------------------------------------------------------------------------------------ No results for input(s): HGBA1C in the last 72 hours. ------------------------------------------------------------------------------------------------------------------ Recent Labs    06/25/17 1541  TRIG 64   ------------------------------------------------------------------------------------------------------------------ No results for input(s): TSH, T4TOTAL, T3FREE, THYROIDAB in  the last 72 hours.  Invalid input(s): FREET3 ------------------------------------------------------------------------------------------------------------------ No results for input(s): VITAMINB12, FOLATE, FERRITIN, TIBC, IRON, RETICCTPCT in the last 72 hours.  Coagulation profile No results for input(s): INR, PROTIME in the last 168 hours.  No results for input(s): DDIMER in the last 72 hours.  Cardiac Enzymes Recent Labs  Lab 06/23/2017 1626 06/21/2017 2232 06/23/17 0409  TROPONINI <0.03 0.04* 0.07*   ------------------------------------------------------------------------------------------------------------------ Invalid input(s): POCBNP    Assessment & Plan   Loreley Schwall  is a 83 y.o. female with a known history of chronic COPD, hypertension, chronic congestive heart failure on loop diuretics is brought into the ED via EMS for severe shortness of breath. Patient was waiting to see her primary  care physician but as her shortness of breath has been gotten worse family decided to call EMS and patient is brought into the ED. Patient chronically lives on 3 L of oxygen but patient was extremity hypoxemic at 70% with a significant rales and wheezes. Patient Glasgow Coma Scale was at 9 by the time of arrival  #Acute encephalopathy secondary to severe hypercarbia Admitted to intensive care unit Patient is intubated Continue vent support and wean as tolerated.  #Acute on chronic hypercarbic hypoxic respiratory failure secondary to end-stage COPD with exacerbation, pseudomonas H CAP.'  Continue cefepime. Vent support, wean as tolerated, co wean down the steroids.,  management as per ICU attending, continue bronchodilators    #acute on  on chronic diastolic heart failure, proximal atrial fibrillation: Continue amiodarone, phenylephrine   #Hypotension   On Levophed, continue pressors to keep map 60.  #Thrombocytopenia, subcu heparin discontinued. No active bleeding or bruising Follow closely,  Diabetes mellitus type 2: Continue insulin sliding scale coverage, Lantus 10 units daily.   Prognosis poor, high risk for cardiac arrest, CODE STATUS DNR.  Possible terminal extubation next week if not improving.              Consults  intensivitst  DVT Prophylaxis  scd's  Lab Results  Component Value Date   PLT 72 (L) 06/26/2017     Time Spent in minutes 35 minutes greater than 50% of time spent in care coordination and counseling patient regarding the condition and plan of care.   Epifanio Lesches M.D on 06/26/2017 at 3:48 PM  Between 7am to 6pm - Pager - [phone]  After 6pm go to [email or web address] - password [email or web address] [facility]   Office  [phone]

## 2017-06-27 DIAGNOSIS — J9601 Acute respiratory failure with hypoxia: Secondary | ICD-10-CM

## 2017-06-27 LAB — BLOOD GAS, ARTERIAL
ACID-BASE EXCESS: 11 mmol/L — AB (ref 0.0–2.0)
Bicarbonate: 39.7 mmol/L — ABNORMAL HIGH (ref 20.0–28.0)
FIO2: 0.35
O2 SAT: 90.1 %
PEEP/CPAP: 5 cmH2O
PO2 ART: 62 mmHg — AB (ref 83.0–108.0)
PRESSURE SUPPORT: 5 cmH2O
Patient temperature: 37
pCO2 arterial: 72 mmHg (ref 32.0–48.0)
pH, Arterial: 7.35 (ref 7.350–7.450)

## 2017-06-27 LAB — BASIC METABOLIC PANEL
ANION GAP: 7 (ref 5–15)
BUN: 49 mg/dL — AB (ref 6–20)
CO2: 34 mmol/L — AB (ref 22–32)
Calcium: 9.5 mg/dL (ref 8.9–10.3)
Chloride: 104 mmol/L (ref 101–111)
Creatinine, Ser: 0.98 mg/dL (ref 0.44–1.00)
GFR calc Af Amer: >60 mL/min (ref >60)
GFR, EST NON AFRICAN AMERICAN: 52 mL/min — AB (ref >60)
Glucose, Bld: 168 mg/dL — ABNORMAL HIGH (ref 65–99)
Potassium: 4.4 mmol/L (ref 3.5–5.1)
Sodium: 145 mmol/L (ref 135–145)

## 2017-06-27 LAB — CBC
HEMATOCRIT: 25.6 % — AB (ref 35.0–47.0)
Hemoglobin: 8.1 g/dL — ABNORMAL LOW (ref 12.0–16.0)
MCH: 28.3 pg (ref 26.0–34.0)
MCHC: 31.5 g/dL — ABNORMAL LOW (ref 32.0–36.0)
MCV: 89.8 fL (ref 80.0–100.0)
PLATELETS: 84 10*3/uL — AB (ref 150–440)
RBC: 2.85 MIL/uL — AB (ref 3.80–5.20)
RDW: 16.4 % — AB (ref 11.5–14.5)
WBC: 15.4 10*3/uL — AB (ref 3.6–11.0)

## 2017-06-27 LAB — GLUCOSE, CAPILLARY
GLUCOSE-CAPILLARY: 150 mg/dL — AB (ref 65–99)
GLUCOSE-CAPILLARY: 147 mg/dL — AB (ref 65–99)
GLUCOSE-CAPILLARY: 160 mg/dL — AB (ref 65–99)
Glucose-Capillary: 139 mg/dL — ABNORMAL HIGH (ref 65–99)
Glucose-Capillary: 153 mg/dL — ABNORMAL HIGH (ref 65–99)
Glucose-Capillary: 182 mg/dL — ABNORMAL HIGH (ref 65–99)

## 2017-06-27 MED ORDER — ORAL CARE MOUTH RINSE
15.0000 mL | OROMUCOSAL | Status: DC
  Administered 2017-06-27 – 2017-06-30 (×30): 15 mL via OROMUCOSAL

## 2017-06-27 NOTE — Progress Notes (Signed)
PULMONARY / CRITICAL CARE MEDICINE   Name: Dana [name] MRN: [id number] DOB: 05/01/1933    ADMISSION DATE:  21-Dec-2016 CONSULTATION DATE: 2017-02-16  REFERRING MD:  Amado CoeGouru  PT PROFILE: [id number] F with end-stage COPD intubated in the ED for acute/chronic hypoxemic/hypercarbic respiratory failure and hypersomnolence.  [facility] same.  During that hospitalization, did not require intubation but did require noninvasive ventilation for a couple of days.  MAJOR EVENTS/TEST RESULTS: 12/24 Admitted 12/25 Mild hypotension due to sedation. Low dose NE initiated. Pulmonary exam improved - weaning in PSV mode initiated 12/26 Failed SBT. CXR c/w PNA. Resp culture + for abundant pseudomonas. Abx adjusted 12/27 Palliative Care consultation: Made DNR 12/27 No progress weaning.  Developed AF with RVR.  Received amiodarone bolus and enteral amiodarone dose increased 12/28 no progress weaning.  Back in NSR.  Remains on low-dose phenylephrine  INDWELLING DEVICES:: ETT 12/24 >>   MICRO DATA: Urine 12/24  >> NEG Resp 12/24 >> abundant Pseudomonas (pansensitive) Blood 12/25 >>   ANTIMICROBIALS:  Doxycycline 12/24 >> 12/26 Levofloxacin 12/26 X 1 Cefepime 12/26 >>   SUBJECTIVE:  Remains intubated, on full vent support Will wean sedation Assess neuro status   VITAL SIGNS: BP (!) 98/55   Pulse 72   Temp 99.5 F (37.5 C) (Oral)   Resp 16   Ht 4\' 9"  (1.448 m)   Wt 160 lb 4.4 oz (72.7 kg)   SpO2 96%   BMI 34.68 kg/m   HEMODYNAMICS:    VENTILATOR SETTINGS: Vent Mode: Spontaneous FiO2 (%):  [35 %-40 %] 40 % Set Rate:  [15 bmp] 15 bmp Vt Set:  [450 mL] 450 mL PEEP:  [5 cmH20] 5 cmH20 Pressure Support:  [12 cmH20] 12 cmH20 Plateau Pressure:  [9 cmH20-22 cmH20] 22 cmH20  INTAKE / OUTPUT: I/O last 3 completed shifts: In: 2569 [I.V.:669; NG/GT:1850; IV Piggyback:50] Out: 3525 [Urine:3525]  PHYSICAL EXAMINATION: General: Intubated,  sedated Neuro:GCS 10T HEENT: NCAT, sclerae white Cardiovascular: Regular, no M noted Lungs: No wheezes.  Moderate mucopurulent secretions Abdomen: Obese, diminished bowel sounds, no palpable masses Extremities: Mild symmetric LE edema   LABS:  BMET Recent Labs  Lab 06/25/17 0327 06/26/17 0457 06/27/17 0456  NA 135 142 145  K 4.0 3.6 4.4  CL 93* 104 104  CO2 33* 33* 34*  BUN 52* 52* 49*  CREATININE 1.36* 1.08* 0.98  GLUCOSE 243* 114* 168*    Electrolytes Recent Labs  Lab 06/25/17 0327 06/26/17 0457 06/27/17 0456  CALCIUM 8.9 8.7* 9.5    CBC Recent Labs  Lab 06/25/17 0327 06/26/17 0457 06/27/17 0456  WBC 25.5* 15.2* 15.4*  HGB 9.6* 8.3* 8.1*  HCT 30.1* 25.8* 25.6*  PLT 97* 72* 84*    Coag's No results for input(s): APTT, INR in the last 168 hours.  Sepsis Markers Recent Labs  Lab 06/23/17 0028 06/24/17 0645 06/25/17 0327  PROCALCITON <0.10 1.23 5.44    ABG Recent Labs  Lab 2017-02-16 1406  PHART 7.46*  PCO2ART 80*  PO2ART 68*    Liver Enzymes Recent Labs  Lab 06/24/17 0645  AST 23  ALT 15  ALKPHOS 58  BILITOT 0.6  ALBUMIN 2.6*    Cardiac Enzymes Recent Labs  Lab 2017-02-16 1626 2017-02-16 2232 06/23/17 0409  TROPONINI <0.03 0.04* 0.07*    Glucose Recent Labs  Lab 06/26/17 1535 06/26/17 1744 06/26/17 2000 06/27/17 0009 06/27/17 0417 06/27/17 0748  GLUCAP 169* 145* 146* 139* 150* 147*    CXR: NSC  ASSESSMENT / PLAN:  PULMONARY A: Acute/chronic hypercarbic/hypoxemic respiratory failure End-stage COPD with acute exacerbation Pseudomonas HCAP P:   Cont vent support - settings reviewed and/or adjusted PSV weaning as tolerated Cont vent bundle Daily SBT if/when meets criteria Cont nebulized steroids and bronchodilators Taper systemic steroids over next 3-5 days  CARDIOVASCULAR A:  History of diastolic heart failure Probable pulmonary hypertension Hypotension due to sedation Paroxysmal atrial fibrillation P:   Cont amiodarone at increased dose (400 mg twice daily) Continue phenylephrine to maintain MAP >65 mmHg DNR in event of cardiac arrest  RENAL A:   Severe metabolic alkalosis, improved Hypervolemia P:   Monitor BMET intermittently Monitor I/Os Correct electrolytes as indicated   GASTROINTESTINAL A:   Obesity P:   SUP: IV famotidine Continue TF protocol - initiated 12/25  HEMATOLOGIC A:   Chronic anemia Thrombocytopenia - SQ heparin DC'd 12/28 P:  DVT px: SCDs Monitor CBC intermittently Transfuse per usual guidelines   INFECTIOUS A:   Pseudomonas HCAP Elevated PCT Severe sepsis P:   Monitor temp, WBC count Micro and abx as above   ENDOCRINE A:   Mild hyperglycemia without dx of DM P:   Cont SSI while on systemic steroids Lantus initiated 12/25  NEUROLOGIC A:   Acute encephalopathy ICU/ventilator associated discomfort Poor baseline functional status P:   RASS goal: -1, -2 PAD protocol -propofol, intermittent fentanyl Per goals of care discussion, continue support for now.      Critical Care Time devoted to patient care services described in this note is 45 minutes.   Overall, patient is critically ill, prognosis is guarded.  Patient with Multiorgan failure and at high risk for cardiac arrest and death.   Plan for one way extubation after discussion with family  Lucie LeatherKurian David Raiven Belizaire, M.D.  [facility]  Medical Director [facility] Medical Director [facility] Cardio[facility] Department

## 2017-06-27 NOTE — Progress Notes (Signed)
No sedative or pain meds required today. Pt dozes at intervals. Arouses easily to follow commands. Family stays at bedside. Oral secretions are thick clear to whitish. SBT done this am and ABG was checked. Dr Belia HemanKasa decided to keep intubated another day but to exercise on 5p/20ps and rest on rate overnight. She had large loose brown stool today. TF at goal rate. UOP good. Neo gtt of at 1430.

## 2017-06-27 NOTE — Progress Notes (Signed)
[place] at [facility]                                                                                                                                                                                  Patient Demographics   Dana [name], is a 83 y.o. female, DOB - Jan 22, 1933, TWK:[id number]  Admit date - 06/12/2017   Admitting Physician Nicholes Mango, MD  Outpatient Primary MD for the patient is Jene Every, MD   LOS - 5  Subjective: Patient is on weaning trial with PS , now off the sedation, she is alert and denies any pain.   granddaughter at bedside.   Review of Systems:   CONSTITUTIONAL: Intubated  Vitals:   Vitals:   06/27/17 1000 06/27/17 1030 06/27/17 1130 06/27/17 1200  BP: (!) 96/50 (!) 103/59 (!) 96/52 (!) 100/59  Pulse: 80 79 79 77  Resp: 18 (!) 23 (!) 22 (!) 21  Temp:      TempSrc:      SpO2: 95% 94% 96% 94%  Weight:      Height:        Wt Readings from Last 3 Encounters:  06/17/2017 72.7 kg (160 lb 4.4 oz)  05/31/17 65.3 kg (143 lb 14.4 oz)  04/01/16 51.7 kg (114 lb)     Intake/Output Summary (Last 24 hours) at 06/27/2017 1309 Last data filed at 06/27/2017 1200 Gross per 24 hour  Intake 2150.92 ml  Output 2125 ml  Net 25.92 ml  Review of systems unobtainable because of intubation and sedation.  Physical Exam:   GENERAL: Intubated, critically ill HEAD, EYES, EARS, NOSE AND THROAT: Atraumatic, normocephalic.  Pupils equal and reactive to light. Sclerae anicteric. No conjunctival injection. No oro-pharyngeal erythema.  Orally intubated. NECK: Supple. There is no jugular venous distention. No bruits, no lymphadenopathy, no thyromegaly.  HEART: Regular rate and rhythm,. No murmurs, no rubs, no clicks.  LUNGS: Mostly clear to auscultation. ABDOMEN: Soft, flat, nontender, nondistended. Has good bowel sounds. No hepatosplenomegaly appreciated.  EXTREMITIES: No evidence of any cyanosis, clubbing, or peripheral edema.  +2 pedal and  radial pulses bilaterally.  NEUROLOGIC:sedated SKIN: Moist and warm with no rashes appreciated.  Psych: Sedated  LN: No inguinal LN enlargement    Antibiotics   Anti-infectives (From admission, onward)   Start     Dose/Rate Route Frequency Ordered Stop   06/26/17 1500  ceFEPIme (MAXIPIME) 2 g in dextrose 5 % 50 mL IVPB     2 g 100 mL/hr over 30 Minutes Intravenous Every 12 hours 06/26/17 1427     06/26/17 1000  levofloxacin (LEVAQUIN) IVPB 750 mg  Status:  Discontinued  750 mg 100 mL/hr over 90 Minutes Intravenous Every 48 hours 06/24/17 1142 06/24/17 1551   06/24/17 1600  ceFEPIme (MAXIPIME) 1 g in dextrose 5 % 50 mL IVPB  Status:  Discontinued     1 g 100 mL/hr over 30 Minutes Intravenous Every 24 hours 06/24/17 1555 06/24/17 1557   06/24/17 1600  ceFEPIme (MAXIPIME) 2 g in dextrose 5 % 50 mL IVPB  Status:  Discontinued     2 g 100 mL/hr over 30 Minutes Intravenous Every 24 hours 06/24/17 1557 06/26/17 1427   06/24/17 1300  levofloxacin (LEVAQUIN) IVPB 750 mg     750 mg 100 mL/hr over 90 Minutes Intravenous  Once 06/24/17 1142 06/24/17 1459   06/17/2017 1630  doxycycline (VIBRAMYCIN) 100 mg in dextrose 5 % 250 mL IVPB  Status:  Discontinued     100 mg 125 mL/hr over 120 Minutes Intravenous Every 12 hours 06/14/2017 1501 06/24/17 1029      Medications   Scheduled Meds: . amiodarone  200 mg Per Tube Daily  . aspirin  81 mg Per Tube Daily  . budesonide (PULMICORT) nebulizer solution  0.25 mg Nebulization Q6H  . chlorhexidine gluconate (MEDLINE KIT)  15 mL Mouth Rinse BID  . famotidine  20 mg Per Tube Daily  . insulin aspart  0-15 Units Subcutaneous Q4H  . insulin glargine  10 Units Subcutaneous Daily  . ipratropium-albuterol  3 mL Nebulization Q6H  . mouth rinse  15 mL Mouth Rinse QID  . multivitamin  15 mL Per Tube Daily  . predniSONE  20 mg Per Tube Daily  . sodium chloride flush  10-40 mL Intracatheter Q12H  . sodium chloride flush  3 mL Intravenous Q12H   Continuous  Infusions: . sodium chloride 250 mL (06/27/17 0700)  . ceFEPime (MAXIPIME) IV Stopped (06/27/17 0240)  . feeding supplement (VITAL HIGH PROTEIN) 1,000 mL (06/27/17 0700)  . phenylephrine (NEO-SYNEPHRINE) Adult infusion 5 mcg/min (06/27/17 0810)   PRN Meds:.sodium chloride, acetaminophen, albuterol, fentaNYL (SUBLIMAZE) injection, midazolam, ondansetron (ZOFRAN) IV, sodium chloride flush, sodium chloride flush   Data Review:   Micro Results Recent Results (from the past 240 hour(s))  Culture, respiratory (NON-Expectorated)     Status: None   Collection Time: 06/20/2017  2:56 PM  Result Value Ref Range Status   Specimen Description   Final    TRACHEAL ASPIRATE Performed at [facility], [address] [place], [place] [address]    Special Requests   Final    NONE Performed at [facility], [address] [place], [place] [address]    Gram Stain   Final    ABUNDANT WBC PRESENT,BOTH PMN AND MONONUCLEAR RARE GRAM POSITIVE COCCI Performed at [facility], [place] [address], [place], [place] [address]    Culture ABUNDANT PSEUDOMONAS AERUGINOSA  Final   Report Status 06/26/2017 FINAL  Final   Organism ID, Bacteria PSEUDOMONAS AERUGINOSA  Final      Susceptibility   Pseudomonas aeruginosa - MIC*    CEFTAZIDIME 4 SENSITIVE Sensitive     CIPROFLOXACIN <=0.25 SENSITIVE Sensitive     GENTAMICIN <=1 SENSITIVE Sensitive     IMIPENEM <=0.25 SENSITIVE Sensitive     PIP/TAZO <=4 SENSITIVE Sensitive     CEFEPIME 2 SENSITIVE Sensitive     * ABUNDANT PSEUDOMONAS AERUGINOSA  CULTURE, BLOOD (ROUTINE X 2) w Reflex to ID Panel     Status: None (Preliminary result)   Collection Time: 06/23/17 12:28 AM  Result Value Ref Range Status  Specimen Description BLOOD LEFT FOREARM  Final   Special Requests   Final    BOTTLES DRAWN AEROBIC AND ANAEROBIC Blood Culture adequate volume   Culture   Final    NO GROWTH 4 DAYS Performed at [facility], [address]  [place], [place] [address]    Report Status PENDING  Incomplete  CULTURE, BLOOD (ROUTINE X 2) w Reflex to ID Panel     Status: None (Preliminary result)   Collection Time: 06/23/17 12:36 AM  Result Value Ref Range Status   Specimen Description BLOOD LEFT HAND  Final   Special Requests   Final    BOTTLES DRAWN AEROBIC AND ANAEROBIC Blood Culture adequate volume   Culture   Final    NO GROWTH 4 DAYS Performed at [facility], [address] [place], [place] [address]    Report Status PENDING  Incomplete  Urine Culture     Status: None   Collection Time: 06/23/17 12:45 AM  Result Value Ref Range Status   Specimen Description   Final    URINE, RANDOM Performed at [facility], [address], [place], [place] [address]    Special Requests   Final    NONE Performed at [facility], [address] [place], [place] [address]    Culture   Final    NO GROWTH Performed at [facility], [place] [address] [place], [place] [address]    Report Status 06/24/2017 FINAL  Final  MRSA PCR Screening     Status: None   Collection Time: 06/23/17  7:38 PM  Result Value Ref Range Status   MRSA by PCR NEGATIVE NEGATIVE Final    Comment:        The GeneXpert MRSA Assay (FDA approved for NASAL specimens only), is one component of a comprehensive MRSA colonization surveillance program. It is not intended to diagnose MRSA infection nor to guide or monitor treatment for MRSA infections. Performed at [facility], [address], [place], [place] [address]     Radiology Reports Dg Chest [place] 1 View  Result Date: 06/26/2017 CLINICAL DATA:  Respiratory failure. EXAM: PORTABLE CHEST 1 VIEW COMPARISON:  One-view chest x-ray 06/25/2017 FINDINGS: Heart is exaggerated by low lung volumes. Endotracheal tube terminates 3.5 cm above the carina, advanced since the prior exam. NG tube courses off the inferior border of the film. A right-sided PICC line is in place.  Asymmetric right-sided interstitial and airspace disease continues to progress. Bibasilar airspace disease and effusions are unchanged. IMPRESSION: 1. Progressive right-sided interstitial and airspace disease representing a combination of edema and pneumonia. 2. Bilateral pleural effusions and basilar airspace disease is otherwise unchanged. 3. Stable cardiomegaly. 4. Endotracheal tube has been advanced, still 3.5 cm above the carina. Electronically Signed   By: San Morelle M.D.   On: 06/26/2017 07:05   Dg Chest Port 1 View  Result Date: 06/25/2017 CLINICAL DATA:  Respiratory failure EXAM: PORTABLE CHEST 1 VIEW COMPARISON:  06/24/2017 FINDINGS: Cardiac shadow is stable. Endotracheal tube, nasogastric catheter and right-sided PICC line are noted in satisfactory position. Extrinsic artifact is noted over the midline consistent with a bobby pin. Patchy infiltrative changes are again seen bilaterally but predominately within the right lung no pneumothorax is seen. Small bilateral pleural effusions are again noted. IMPRESSION: Overall stable appearance of the chest when compared with the prior exam. Electronically Signed   By: Inez Catalina M.D.   On: 06/25/2017 07:39   Dg Chest [facility]  1 View  Result Date: 06/24/2017 CLINICAL DATA:  PICC line placement EXAM: PORTABLE CHEST 1 VIEW COMPARISON:  Earlier same day; 06/23/2017; 06/21/2017; 05/29/2017 FINDINGS: Grossly unchanged cardiac silhouette and mediastinal contours with atherosclerotic plaque within thoracic aorta. Interval placement of right upper extremity approach PICC line with tip projected over the superior cavoatrial junction. Otherwise, stable position of support apparatus. No pneumothorax. Worsening nodular airspace opacities scattered within the right lung. Interval increase in trace/ small bilateral effusions and worsening bibasilar heterogeneous / consolidative opacities, left greater than right. Unchanged bones. IMPRESSION: 1. Right upper  extremity approach PICC line tip projects over the superior cavoatrial junction. 2. Otherwise, stable positioning of support apparatus. No pneumothorax. 3. Worsening bilateral airspace opacities worrisome for the progression of multifocal infection, including atypical etiologies, though note, the nodular appearing airspace opacities within the right mid lung could be seen in the setting of septic emboli. Clinical correlation is advised. Electronically Signed   By: Sandi Mariscal M.D.   On: 06/24/2017 16:55   Dg Chest Port 1 View  Result Date: 06/24/2017 CLINICAL DATA:  Hypoxia EXAM: PORTABLE CHEST 1 VIEW COMPARISON:  June 23, 2017 FINDINGS: Endotracheal tube tip is 3.1 cm above the carina. Nasogastric tube tip and side port are below the diaphragm. No pneumothorax. There are pleural effusions bilaterally with patchy airspace opacity in both lower lobes. There is cardiomegaly with mild pulmonary venous hypertension. There is aortic atherosclerosis. No adenopathy. No bone lesions. IMPRESSION: Tubes as described without pneumothorax. Pleural effusions bilaterally with airspace opacity in both lower lobes, likely due primarily to atelectasis, although a degree of superimposed alveolar edema and/or pneumonia cannot be excluded in the lung bases. There is stable cardiomegaly with mild pulmonary venous hypertension. There is aortic atherosclerosis. Appearance is essentially stable compare to 1 day prior. Aortic Atherosclerosis (ICD10-I70.0). Electronically Signed   By: Lowella Grip III M.D.   On: 06/24/2017 07:07   Dg Chest Port 1 View  Result Date: 06/23/2017 CLINICAL DATA:  Respiratory failure EXAM: PORTABLE CHEST 1 VIEW COMPARISON:  Chest radiograph 05/30/2017 FINDINGS: Worsened aeration of the right lung base, with possible veiling pleural effusion. The endotracheal tube has been retracted and is now in satisfactory position at the level of the clavicular heads. Orogastric tube side port is in the  stomach. There is calcific aortic arch atherosclerosis. Cardiomediastinal contours are unchanged. Diffuse interstitial prominence remains. IMPRESSION: 1. Satisfactory position of endotracheal tube. 2. Worsening aeration of the right lung base with possible veiling pleural effusion. Electronically Signed   By: Ulyses Jarred M.D.   On: 06/23/2017 04:56   Dg Chest Portable 1 View  Result Date: 05/31/2017 CLINICAL DATA:  Intubation EXAM: PORTABLE CHEST 1 VIEW COMPARISON:  June 22, 2017 FINDINGS: The ETT terminates 17 mm above the carina. An OG tube terminates below today's film. Improving mild edema. Right greater than left pleural effusions with underlying opacities remain, unchanged. IMPRESSION: 1. The ETT terminates 17 mm above the carina. Recommend withdrawing 1 cm. 2. Improving edema. 3. Right greater than left pleural effusions with underlying opacities, similar in the interval. Electronically Signed   By: Dorise Bullion III M.D   On: 06/25/2017 13:34   Dg Chest Portable 1 View  Result Date: 06/10/2017 CLINICAL DATA:  Shortness of breath. EXAM: PORTABLE CHEST 1 VIEW COMPARISON:  May 29, 2017 FINDINGS: Cardiomegaly. Right greater than left pleural effusions with underlying opacities. Diffuse interstitial opacity. No other interval change. IMPRESSION: Findings are consistent with pulmonary edema. Bilateral pleural effusions with underlying opacities,  right greater than left. The findings are similar in the interval. Electronically Signed   By: Dorise Bullion III M.D   On: 06/19/2017 12:36   Dg Chest Port 1 View  Result Date: 05/29/2017 CLINICAL DATA:  Respiratory failure. EXAM: PORTABLE CHEST 1 VIEW COMPARISON:  05/28/2017. FINDINGS: Mediastinum hilar structures are normal. Cardiomegaly with bilateral from interstitial prominence and bilateral pleural effusions consistent CHF. No change from prior exam. IMPRESSION: Congestive heart failure pulmonary interstitial edema and bilateral pleural  effusions. No significant change from prior exam. Electronically Signed   By: Marcello Moores  Register   On: 05/29/2017 09:26   Dg Abd Portable 1 View  Result Date: 06/26/2017 CLINICAL DATA:  Orogastric tube placement EXAM: PORTABLE ABDOMEN - 1 VIEW COMPARISON:  February 16, 2015 FINDINGS: Orogastric tube tip and side port are in the stomach. There is moderate stool throughout the colon. There is no bowel dilatation or air-fluid level to suggest bowel obstruction. No free air. There is aortic atherosclerosis. There is a small left pleural effusion. IMPRESSION: Orogastric tube tip and side port in stomach. No bowel obstruction or free air. Moderate stool throughout colon. Small left pleural effusion. There is aortic atherosclerosis. Aortic Atherosclerosis (ICD10-I70.0). Electronically Signed   By: Lowella Grip III M.D.   On: 06/27/2017 13:33     CBC Recent Labs  Lab 06/08/2017 1215 06/23/17 0409 06/24/17 0645 06/25/17 0327 06/26/17 0457 06/27/17 0456  WBC 7.7 14.4* 22.5* 25.5* 15.2* 15.4*  HGB 10.9* 11.1* 10.1* 9.6* 8.3* 8.1*  HCT 35.0 35.3 31.3* 30.1* 25.8* 25.6*  PLT 101* 121* 104* 97* 72* 84*  MCV 92.6 91.3 89.9 89.7 89.4 89.8  MCH 28.9 28.6 29.0 28.7 28.9 28.3  MCHC 31.2* 31.3* 32.3 32.0 32.3 31.5*  RDW 16.5* 16.2* 16.1* 16.8* 16.9* 16.4*  LYMPHSABS 0.7*  --   --   --   --   --   MONOABS 0.8  --   --   --   --   --   EOSABS 0.1  --   --   --   --   --   BASOSABS 0.0  --   --   --   --   --     Chemistries  Recent Labs  Lab 06/23/17 0409 06/24/17 0645 06/25/17 0327 06/26/17 0457 06/27/17 0456  NA 140 136 135 142 145  K 3.7 3.9 4.0 3.6 4.4  CL 85* 95* 93* 104 104  CO2 44* 33* 33* 33* 34*  GLUCOSE 174* 181* 243* 114* 168*  BUN 24* 45* 52* 52* 49*  CREATININE 1.58* 1.36* 1.36* 1.08* 0.98  CALCIUM 9.1 8.7* 8.9 8.7* 9.5  AST  --  23  --   --   --   ALT  --  15  --   --   --   ALKPHOS  --  58  --   --   --   BILITOT  --  0.6  --   --   --     ------------------------------------------------------------------------------------------------------------------ estimated creatinine clearance is 36.5 mL/min (by C-G formula based on SCr of 0.98 mg/dL). ------------------------------------------------------------------------------------------------------------------ No results for input(s): HGBA1C in the last 72 hours. ------------------------------------------------------------------------------------------------------------------ Recent Labs    06/25/17 1541  TRIG 64   ------------------------------------------------------------------------------------------------------------------ No results for input(s): TSH, T4TOTAL, T3FREE, THYROIDAB in the last 72 hours.  Invalid input(s): FREET3 ------------------------------------------------------------------------------------------------------------------ No results for input(s): VITAMINB12, FOLATE, FERRITIN, TIBC, IRON, RETICCTPCT in the last 72 hours.  Coagulation profile No results for input(s): INR, PROTIME in  the last 168 hours.  No results for input(s): DDIMER in the last 72 hours.  Cardiac Enzymes Recent Labs  Lab 06/14/2017 1626 06/12/2017 2232 06/23/17 0409  TROPONINI <0.03 0.04* 0.07*   ------------------------------------------------------------------------------------------------------------------ Invalid input(s): POCBNP    Assessment & Plan   Dana [name]  is a 83 y.o. female with a known history of chronic COPD, hypertension, chronic congestive heart failure on loop diuretics is brought into the ED via EMS for severe shortness of breath. Patient was waiting to see her primary care physician but as her shortness of breath has been gotten worse family decided to call EMS and patient is brought into the ED. Patient chronically lives on 3 L of oxygen but patient was extremity hypoxemic at 70% with a significant rales and wheezes. Patient Glasgow Coma Scale was at 9 by  the time of arrival  #Acute encephalopathy secondary to severe hypercarbia Admitted to intensive care unit Patient is intubated Continue vent support and wean as tolerated. Sedation being turned off, patient is alert.  #Acute on chronic hypercarbic hypoxic respiratory failure secondary to end-stage COPD with exacerbation, pseudomonas H CAP.'  Continue cefepime.  CBC is down from 25-15. Vent support, wean as tolerated, co wean down the steroids.,  management as per ICU attending, continue bronchodilators  #acute on  on chronic diastolic heart failure, proximal atrial fibrillation: Continue amiodarone, phenylephrine   #Hypotension; on small dose of Neo-Synephrine.   #Thrombocytopenia, subcu heparin discontinued.  Platelets stable around 70.  No evidence of bleeding.  Diabetes mellitus type 2: Continue insulin sliding scale coverage, Lantus 10 units daily.  Continue slow weaning trials.  Discussed with granddaughter.  . Prognosis poor, high risk for cardiac arrest, CODE STATUS DNR.      Possible terminal extubation next week if not improving.              Consults  intensivitst  DVT Prophylaxis  scd's  Lab Results  Component Value Date   PLT 84 (L) 06/27/2017     Time Spent in minutes 35 minutes greater than 50% of time spent in care coordination and counseling patient regarding the condition and plan of care.   Epifanio Lesches M.D on 06/27/2017 at 1:09 PM  Between 7am to 6pm - Pager - [phone]  After 6pm go to [email or web address] - password [email or web address] [facility]   Office  [phone]

## 2017-06-28 ENCOUNTER — Inpatient Hospital Stay: Payer: Medicare Other

## 2017-06-28 ENCOUNTER — Other Ambulatory Visit: Payer: Self-pay

## 2017-06-28 LAB — BASIC METABOLIC PANEL
Anion gap: 5 (ref 5–15)
BUN: 52 mg/dL — ABNORMAL HIGH (ref 6–20)
CHLORIDE: 110 mmol/L (ref 101–111)
CO2: 36 mmol/L — ABNORMAL HIGH (ref 22–32)
Calcium: 9.6 mg/dL (ref 8.9–10.3)
Creatinine, Ser: 0.92 mg/dL (ref 0.44–1.00)
GFR, EST NON AFRICAN AMERICAN: 56 mL/min — AB (ref >60)
Glucose, Bld: 199 mg/dL — ABNORMAL HIGH (ref 65–99)
POTASSIUM: 4.2 mmol/L (ref 3.5–5.1)
SODIUM: 151 mmol/L — AB (ref 135–145)

## 2017-06-28 LAB — CBC
HCT: 23.4 % — ABNORMAL LOW (ref 35.0–47.0)
Hemoglobin: 7.5 g/dL — ABNORMAL LOW (ref 12.0–16.0)
MCH: 29 pg (ref 26.0–34.0)
MCHC: 32.1 g/dL (ref 32.0–36.0)
MCV: 90.4 fL (ref 80.0–100.0)
Platelets: 82 10*3/uL — ABNORMAL LOW (ref 150–440)
RBC: 2.59 MIL/uL — ABNORMAL LOW (ref 3.80–5.20)
RDW: 16.7 % — ABNORMAL HIGH (ref 11.5–14.5)
WBC: 12.3 10*3/uL — ABNORMAL HIGH (ref 3.6–11.0)

## 2017-06-28 LAB — BLOOD GAS, ARTERIAL
ACID-BASE EXCESS: 14.2 mmol/L — AB (ref 0.0–2.0)
Bicarbonate: 41.8 mmol/L — ABNORMAL HIGH (ref 20.0–28.0)
FIO2: 0.35
MODE: POSITIVE
O2 Saturation: 91.2 %
PCO2 ART: 74 mmHg — AB (ref 32.0–48.0)
PEEP/CPAP: 5 cmH2O
Patient temperature: 37
Pressure support: 5 cmH2O
pH, Arterial: 7.36 (ref 7.350–7.450)
pO2, Arterial: 64 mmHg — ABNORMAL LOW (ref 83.0–108.0)

## 2017-06-28 LAB — PHOSPHORUS: PHOSPHORUS: 3.1 mg/dL (ref 2.5–4.6)

## 2017-06-28 LAB — GLUCOSE, CAPILLARY
GLUCOSE-CAPILLARY: 155 mg/dL — AB (ref 65–99)
GLUCOSE-CAPILLARY: 136 mg/dL — AB (ref 65–99)
Glucose-Capillary: 130 mg/dL — ABNORMAL HIGH (ref 65–99)
Glucose-Capillary: 133 mg/dL — ABNORMAL HIGH (ref 65–99)
Glucose-Capillary: 169 mg/dL — ABNORMAL HIGH (ref 65–99)
Glucose-Capillary: 171 mg/dL — ABNORMAL HIGH (ref 65–99)

## 2017-06-28 LAB — CULTURE, BLOOD (ROUTINE X 2)
Culture: NO GROWTH
Culture: NO GROWTH
SPECIAL REQUESTS: ADEQUATE
Special Requests: ADEQUATE

## 2017-06-28 LAB — MAGNESIUM: Magnesium: 2.4 mg/dL (ref 1.7–2.4)

## 2017-06-28 MED ORDER — ACETAZOLAMIDE SODIUM 500 MG IJ SOLR
500.0000 mg | Freq: Once | INTRAMUSCULAR | Status: AC
  Administered 2017-06-28: 500 mg via INTRAVENOUS
  Filled 2017-06-28: qty 500

## 2017-06-28 NOTE — Plan of Care (Signed)
VSS O/N, pt. Alert, follows commands, denies pain, rested well overnight. No care concerns at this time.

## 2017-06-28 NOTE — Progress Notes (Signed)
PULMONARY / CRITICAL CARE MEDICINE   Name: Dana [name] MRN: [id number] DOB: 11/02/1932    ADMISSION DATE:  12-29-2016 CONSULTATION DATE: 10-18-16  REFERRING MD:  Amado CoeGouru  PT PROFILE: [id number] F with end-stage COPD intubated in the ED for acute/chronic hypoxemic/hypercarbic respiratory failure and hypersomnolence.  [facility] after hospitalization for same.  During that hospitalization, did not require intubation but did require noninvasive ventilation for a couple of days.  MAJOR EVENTS/TEST RESULTS: 12/24 Admitted 12/25 Mild hypotension due to sedation. Low dose NE initiated. Pulmonary exam improved - weaning in PSV mode initiated 12/26 Failed SBT. CXR c/w PNA. Resp culture + for abundant pseudomonas. Abx adjusted 12/27 Palliative Care consultation: Made DNR 12/27 No progress weaning.  Developed AF with RVR.  Received amiodarone bolus and enteral amiodarone dose increased 12/28 no progress weaning.  Back in NSR.  Remains on low-dose phenylephrine  INDWELLING DEVICES:: ETT 12/24 >>   MICRO DATA: Urine 12/24  >> NEG Resp 12/24 >> abundant Pseudomonas (pansensitive) Blood 12/25 >>   ANTIMICROBIALS:  Doxycycline 12/24 >> 12/26 Levofloxacin 12/26 X 1 Cefepime 12/26 >>   SUBJECTIVE:  Remains intubated, on full vent support Patient failed SAT/SBT due to resp muscle weakness Will wean sedation Assess neuro status   VITAL SIGNS: BP (!) 101/55   Pulse 83   Temp 99.6 F (37.6 C) (Axillary)   Resp (!) 24   Ht 4\' 9"  (1.448 m)   Wt 160 lb 4.4 oz (72.7 kg)   SpO2 93%   BMI 34.68 kg/m   HEMODYNAMICS:    VENTILATOR SETTINGS: Vent Mode: Spontaneous FiO2 (%):  [35 %-40 %] 35 % Set Rate:  [15 bmp] 15 bmp Vt Set:  [450 mL] 450 mL PEEP:  [5 cmH20] 5 cmH20 Pressure Support:  [12 cmH20-20 cmH20] 12 cmH20 Plateau Pressure:  [10 cmH20-25 cmH20] 10 cmH20  INTAKE / OUTPUT: I/O last 3 completed shifts: In: 3380.7 [I.V.:730.7; NG/GT:2500; IV Piggyback:150] Out:  2535 [Urine:2535]  PHYSICAL EXAMINATION: General: Intubated, sedated Neuro:GCS 10T HEENT: NCAT, sclerae white Cardiovascular: Regular, no M noted Lungs: No wheezes.  Moderate mucopurulent secretions Abdomen: Obese, diminished bowel sounds, no palpable masses Extremities: Mild symmetric LE edema   LABS:  BMET Recent Labs  Lab 06/26/17 0457 06/27/17 0456 06/28/17 0519  NA 142 145 151*  K 3.6 4.4 4.2  CL 104 104 110  CO2 33* 34* 36*  BUN 52* 49* 52*  CREATININE 1.08* 0.98 0.92  GLUCOSE 114* 168* 199*    Electrolytes Recent Labs  Lab 06/26/17 0457 06/27/17 0456 06/28/17 0519  CALCIUM 8.7* 9.5 9.6  MG  --   --  2.4  PHOS  --   --  3.1    CBC Recent Labs  Lab 06/26/17 0457 06/27/17 0456 06/28/17 0519  WBC 15.2* 15.4* 12.3*  HGB 8.3* 8.1* 7.5*  HCT 25.8* 25.6* 23.4*  PLT 72* 84* 82*    Coag's No results for input(s): APTT, INR in the last 168 hours.  Sepsis Markers Recent Labs  Lab 06/23/17 0028 06/24/17 0645 06/25/17 0327  PROCALCITON <0.10 1.23 5.44    ABG Recent Labs  Lab 10-18-16 1406 06/27/17 0854  PHART 7.46* 7.35  PCO2ART 80* 72*  PO2ART 68* 62*    Liver Enzymes Recent Labs  Lab 06/24/17 0645  AST 23  ALT 15  ALKPHOS 58  BILITOT 0.6  ALBUMIN 2.6*    Cardiac Enzymes Recent Labs  Lab 10-18-16 1626 10-18-16 2232 06/23/17 0409  TROPONINI <0.03 0.04* 0.07*  Glucose Recent Labs  Lab 06/27/17 1134 06/27/17 1607 06/27/17 2002 06/28/17 0014 06/28/17 0338 06/28/17 0735  GLUCAP 153* 182* 160* 133* 155* 130*    CXR: NSC   ASSESSMENT / PLAN:  PULMONARY A: Acute/chronic hypercarbic/hypoxemic respiratory failure End-stage COPD with acute exacerbation Pseudomonas HCAP P:   Cont vent support - settings reviewed and/or adjusted PSV weaning as tolerated Cont vent bundle Daily SBT if/when meets criteria Cont nebulized steroids and bronchodilators Failed SAT/SBT yesterday due to resp muscle weakness Plan again for  SAT/SBT  CARDIOVASCULAR A:  History of diastolic heart failure Probable pulmonary hypertension Hypotension due to sedation Paroxysmal atrial fibrillation P:  Cont amiodarone at increased dose (400 mg twice daily) Continue phenylephrine to maintain MAP >65 mmHg DNR in event of cardiac arrest  RENAL A:   Severe metabolic alkalosis, improved Hypervolemia P:   Monitor BMET intermittently Monitor I/Os Correct electrolytes as indicated   GASTROINTESTINAL A:   Obesity P:   SUP: IV famotidine Continue TF protocol - initiated 12/25  HEMATOLOGIC A:   Chronic anemia Thrombocytopenia - SQ heparin DC'd 12/28 P:  DVT px: SCDs Monitor CBC intermittently Transfuse per usual guidelines   INFECTIOUS A:   Pseudomonas HCAP Elevated PCT Severe sepsis P:   Monitor temp, WBC count Micro and abx as above   ENDOCRINE A:   Mild hyperglycemia without dx of DM P:   Cont SSI while on systemic steroids Lantus initiated 12/25  NEUROLOGIC A:   Acute encephalopathy ICU/ventilator associated discomfort Poor baseline functional status P:   RASS goal: -1, -2 PAD protocol -propofol, intermittent fentanyl Per goals of care discussion, continue support for now.      Critical Care Time devoted to patient care services described in this note is 37 minutes.   Overall, patient is critically ill, prognosis is guarded.  Patient with Multiorgan failure and at high risk for cardiac arrest and death.   Plan for one way extubation after discussion with family  Lucie LeatherKurian David Keelynn Furgerson, M.D.  [facility]  Medical Director [facility] Medical Director [facility]

## 2017-06-28 NOTE — Progress Notes (Signed)
[place] at [facility]                                                                                                                                                                                  Patient Demographics   Dana [name], is a 83 y.o. female, DOB - April 25, 1933, VZC:[id number]  Admit date - 06/14/2017   Admitting Physician Nicholes Mango, MD  Outpatient Primary MD for the patient is Jene Every, MD   LOS - 6  Subjective: The alert, awake.  Patient's family at bedside, getting weaning trial with pressure support.  Review of Systems:   CONSTITUTIONAL: Intubated  Vitals:   Vitals:   06/28/17 0900 06/28/17 1000 06/28/17 1125 06/28/17 1200  BP: (!) 101/55 95/67 123/70 124/89  Pulse: 83 84 96 (!) 119  Resp: (!) 24 (!) 24 (!) 30 (!) 28  Temp:    99.7 F (37.6 C)  TempSrc:    Axillary  SpO2: 93% 93% 92% 91%  Weight:      Height:        Wt Readings from Last 3 Encounters:  06/09/2017 72.7 kg (160 lb 4.4 oz)  05/31/17 65.3 kg (143 lb 14.4 oz)  04/01/16 51.7 kg (114 lb)     Intake/Output Summary (Last 24 hours) at 06/28/2017 1258 Last data filed at 06/28/2017 1200 Gross per 24 hour  Intake 1589.8 ml  Output 1010 ml  Net 579.8 ml  Review of systems unobtainable because of intubation and sedation.  Physical Exam:   GENERAL: Intubated, critically ill HEAD, EYES, EARS, NOSE AND THROAT: Atraumatic, normocephalic.  Pupils equal and reactive to light. Sclerae anicteric. No conjunctival injection. No oro-pharyngeal erythema.  Orally intubated. NECK: Supple. There is no jugular venous distention. No bruits, no lymphadenopathy, no thyromegaly.  HEART: Regular rate and rhythm,. No murmurs, no rubs, no clicks.  LUNGS: Mostly clear to auscultation. ABDOMEN: Soft, flat, nontender, nondistended. Has good bowel sounds. No hepatosplenomegaly appreciated.  EXTREMITIES: No evidence of any cyanosis, clubbing, or peripheral edema.  +2 pedal and radial  pulses bilaterally.  NEUROLOGIC:sedated SKIN: Moist and warm with no rashes appreciated.  Psych: Sedated  LN: No inguinal LN enlargement    Antibiotics   Anti-infectives (From admission, onward)   Start     Dose/Rate Route Frequency Ordered Stop   06/26/17 1500  ceFEPIme (MAXIPIME) 2 g in dextrose 5 % 50 mL IVPB     2 g 100 mL/hr over 30 Minutes Intravenous Every 12 hours 06/26/17 1427     06/26/17 1000  levofloxacin (LEVAQUIN) IVPB 750 mg  Status:  Discontinued     750 mg 100 mL/hr over 90 Minutes Intravenous Every  48 hours 06/24/17 1142 06/24/17 1551   06/24/17 1600  ceFEPIme (MAXIPIME) 1 g in dextrose 5 % 50 mL IVPB  Status:  Discontinued     1 g 100 mL/hr over 30 Minutes Intravenous Every 24 hours 06/24/17 1555 06/24/17 1557   06/24/17 1600  ceFEPIme (MAXIPIME) 2 g in dextrose 5 % 50 mL IVPB  Status:  Discontinued     2 g 100 mL/hr over 30 Minutes Intravenous Every 24 hours 06/24/17 1557 06/26/17 1427   06/24/17 1300  levofloxacin (LEVAQUIN) IVPB 750 mg     750 mg 100 mL/hr over 90 Minutes Intravenous  Once 06/24/17 1142 06/24/17 1459   06/27/2017 1630  doxycycline (VIBRAMYCIN) 100 mg in dextrose 5 % 250 mL IVPB  Status:  Discontinued     100 mg 125 mL/hr over 120 Minutes Intravenous Every 12 hours 06/09/2017 1501 06/24/17 1029      Medications   Scheduled Meds: . amiodarone  200 mg Per Tube Daily  . aspirin  81 mg Per Tube Daily  . budesonide (PULMICORT) nebulizer solution  0.25 mg Nebulization Q6H  . chlorhexidine gluconate (MEDLINE KIT)  15 mL Mouth Rinse BID  . famotidine  20 mg Per Tube Daily  . insulin aspart  0-15 Units Subcutaneous Q4H  . insulin glargine  10 Units Subcutaneous Daily  . ipratropium-albuterol  3 mL Nebulization Q6H  . mouth rinse  15 mL Mouth Rinse 10 times per day  . multivitamin  15 mL Per Tube Daily  . predniSONE  20 mg Per Tube Daily  . sodium chloride flush  10-40 mL Intracatheter Q12H  . sodium chloride flush  3 mL Intravenous Q12H    Continuous Infusions: . sodium chloride Stopped (06/28/17 0700)  . ceFEPime (MAXIPIME) IV Stopped (06/28/17 0500)  . feeding supplement (VITAL HIGH PROTEIN) 1,000 mL (06/28/17 1200)  . phenylephrine (NEO-SYNEPHRINE) Adult infusion Stopped (06/27/17 1430)   PRN Meds:.sodium chloride, acetaminophen, albuterol, fentaNYL (SUBLIMAZE) injection, midazolam, ondansetron (ZOFRAN) IV, sodium chloride flush, sodium chloride flush   Data Review:   Micro Results Recent Results (from the past 240 hour(s))  Culture, respiratory (NON-Expectorated)     Status: None   Collection Time: 06/01/2017  2:56 PM  Result Value Ref Range Status   Specimen Description   Final    TRACHEAL ASPIRATE Performed at [facility], [address], [place], [place] [address]    Special Requests   Final    NONE Performed at [facility], [address], [place], [place] [address]    Gram Stain   Final    ABUNDANT WBC PRESENT,BOTH PMN AND MONONUCLEAR RARE GRAM POSITIVE COCCI Performed at [facility], [place] [address] [place], [place] [address]    Culture ABUNDANT PSEUDOMONAS AERUGINOSA  Final   Report Status 06/26/2017 FINAL  Final   Organism ID, Bacteria PSEUDOMONAS AERUGINOSA  Final      Susceptibility   Pseudomonas aeruginosa - MIC*    CEFTAZIDIME 4 SENSITIVE Sensitive     CIPROFLOXACIN <=0.25 SENSITIVE Sensitive     GENTAMICIN <=1 SENSITIVE Sensitive     IMIPENEM <=0.25 SENSITIVE Sensitive     PIP/TAZO <=4 SENSITIVE Sensitive     CEFEPIME 2 SENSITIVE Sensitive     * ABUNDANT PSEUDOMONAS AERUGINOSA  CULTURE, BLOOD (ROUTINE X 2) w Reflex to ID Panel     Status: None   Collection Time: 06/23/17 12:28 AM  Result Value Ref Range Status   Specimen Description BLOOD LEFT FOREARM  Final  Special Requests   Final    BOTTLES DRAWN AEROBIC AND ANAEROBIC Blood Culture adequate volume   Culture   Final    NO GROWTH 5 DAYS Performed at [facility], [address]  [place], [place] [address]    Report Status 06/28/2017 FINAL  Final  CULTURE, BLOOD (ROUTINE X 2) w Reflex to ID Panel     Status: None   Collection Time: 06/23/17 12:36 AM  Result Value Ref Range Status   Specimen Description BLOOD LEFT HAND  Final   Special Requests   Final    BOTTLES DRAWN AEROBIC AND ANAEROBIC Blood Culture adequate volume   Culture   Final    NO GROWTH 5 DAYS Performed at [facility], [address], [place], [place] [address]    Report Status 06/28/2017 FINAL  Final  Urine Culture     Status: None   Collection Time: 06/23/17 12:45 AM  Result Value Ref Range Status   Specimen Description   Final    URINE, RANDOM Performed at [facility], [address] [place], [place] [address]    Special Requests   Final    NONE Performed at [facility], [address], [place], [place] [address]    Culture   Final    NO GROWTH Performed at [facility], [place] [address] [place], [place] [address]    Report Status 06/24/2017 FINAL  Final  MRSA PCR Screening     Status: None   Collection Time: 06/23/17  7:38 PM  Result Value Ref Range Status   MRSA by PCR NEGATIVE NEGATIVE Final    Comment:        The GeneXpert MRSA Assay (FDA approved for NASAL specimens only), is one component of a comprehensive MRSA colonization surveillance program. It is not intended to diagnose MRSA infection nor to guide or monitor treatment for MRSA infections. Performed at [facility], [address], [place],  [address]     Radiology Reports Dg Chest [place] 1 View  Result Date: 06/26/2017 CLINICAL DATA:  Respiratory failure. EXAM: PORTABLE CHEST 1 VIEW COMPARISON:  One-view chest x-ray 06/25/2017 FINDINGS: Heart is exaggerated by low lung volumes. Endotracheal tube terminates 3.5 cm above the carina, advanced since the prior exam. NG tube courses off the inferior border of the film. A right-sided PICC line is in place. Asymmetric  right-sided interstitial and airspace disease continues to progress. Bibasilar airspace disease and effusions are unchanged. IMPRESSION: 1. Progressive right-sided interstitial and airspace disease representing a combination of edema and pneumonia. 2. Bilateral pleural effusions and basilar airspace disease is otherwise unchanged. 3. Stable cardiomegaly. 4. Endotracheal tube has been advanced, still 3.5 cm above the carina. Electronically Signed   By: San Morelle M.D.   On: 06/26/2017 07:05   Dg Chest Port 1 View  Result Date: 06/25/2017 CLINICAL DATA:  Respiratory failure EXAM: PORTABLE CHEST 1 VIEW COMPARISON:  06/24/2017 FINDINGS: Cardiac shadow is stable. Endotracheal tube, nasogastric catheter and right-sided PICC line are noted in satisfactory position. Extrinsic artifact is noted over the midline consistent with a bobby pin. Patchy infiltrative changes are again seen bilaterally but predominately within the right lung no pneumothorax is seen. Small bilateral pleural effusions are again noted. IMPRESSION: Overall stable appearance of the chest when compared with the prior exam. Electronically Signed   By: Inez Catalina M.D.   On: 06/25/2017 07:39   Dg Chest Port 1 View  Result Date: 06/24/2017 CLINICAL DATA:  PICC line placement EXAM: PORTABLE CHEST 1 VIEW COMPARISON:  Earlier same day; 06/23/2017; 06/27/2017; 05/29/2017 FINDINGS: Grossly unchanged cardiac silhouette and mediastinal contours with atherosclerotic plaque within thoracic aorta. Interval placement of right upper extremity approach PICC line with tip projected over the superior cavoatrial junction. Otherwise, stable position of support apparatus. No pneumothorax. Worsening nodular airspace opacities scattered within the right lung. Interval increase in trace/ small bilateral effusions and worsening bibasilar heterogeneous / consolidative opacities, left greater than right. Unchanged bones. IMPRESSION: 1. Right upper extremity  approach PICC line tip projects over the superior cavoatrial junction. 2. Otherwise, stable positioning of support apparatus. No pneumothorax. 3. Worsening bilateral airspace opacities worrisome for the progression of multifocal infection, including atypical etiologies, though note, the nodular appearing airspace opacities within the right mid lung could be seen in the setting of septic emboli. Clinical correlation is advised. Electronically Signed   By: Sandi Mariscal M.D.   On: 06/24/2017 16:55   Dg Chest Port 1 View  Result Date: 06/24/2017 CLINICAL DATA:  Hypoxia EXAM: PORTABLE CHEST 1 VIEW COMPARISON:  June 23, 2017 FINDINGS: Endotracheal tube tip is 3.1 cm above the carina. Nasogastric tube tip and side port are below the diaphragm. No pneumothorax. There are pleural effusions bilaterally with patchy airspace opacity in both lower lobes. There is cardiomegaly with mild pulmonary venous hypertension. There is aortic atherosclerosis. No adenopathy. No bone lesions. IMPRESSION: Tubes as described without pneumothorax. Pleural effusions bilaterally with airspace opacity in both lower lobes, likely due primarily to atelectasis, although a degree of superimposed alveolar edema and/or pneumonia cannot be excluded in the lung bases. There is stable cardiomegaly with mild pulmonary venous hypertension. There is aortic atherosclerosis. Appearance is essentially stable compare to 1 day prior. Aortic Atherosclerosis (ICD10-I70.0). Electronically Signed   By: Lowella Grip III M.D.   On: 06/24/2017 07:07   Dg Chest Port 1 View  Result Date: 06/23/2017 CLINICAL DATA:  Respiratory failure EXAM: PORTABLE CHEST 1 VIEW COMPARISON:  Chest radiograph 06/20/2017 FINDINGS: Worsened aeration of the right lung base, with possible veiling pleural effusion. The endotracheal tube has been retracted and is now in satisfactory position at the level of the clavicular heads. Orogastric tube side port is in the stomach. There  is calcific aortic arch atherosclerosis. Cardiomediastinal contours are unchanged. Diffuse interstitial prominence remains. IMPRESSION: 1. Satisfactory position of endotracheal tube. 2. Worsening aeration of the right lung base with possible veiling pleural effusion. Electronically Signed   By: Ulyses Jarred M.D.   On: 06/23/2017 04:56   Dg Chest Portable 1 View  Result Date: 06/25/2017 CLINICAL DATA:  Intubation EXAM: PORTABLE CHEST 1 VIEW COMPARISON:  June 22, 2017 FINDINGS: The ETT terminates 17 mm above the carina. An OG tube terminates below today's film. Improving mild edema. Right greater than left pleural effusions with underlying opacities remain, unchanged. IMPRESSION: 1. The ETT terminates 17 mm above the carina. Recommend withdrawing 1 cm. 2. Improving edema. 3. Right greater than left pleural effusions with underlying opacities, similar in the interval. Electronically Signed   By: Dorise Bullion III M.D   On: 06/21/2017 13:34   Dg Chest Portable 1 View  Result Date: 06/13/2017 CLINICAL DATA:  Shortness of breath. EXAM: PORTABLE CHEST 1 VIEW COMPARISON:  May 29, 2017 FINDINGS: Cardiomegaly. Right greater than left pleural effusions with underlying opacities. Diffuse interstitial opacity. No other interval change. IMPRESSION: Findings are consistent with pulmonary edema. Bilateral pleural effusions with underlying opacities, right greater than left. The findings are similar in  the interval. Electronically Signed   By: Dorise Bullion III M.D   On: 06/23/2017 12:36   Dg Abd Portable 1 View  Result Date: 06/23/2017 CLINICAL DATA:  Orogastric tube placement EXAM: PORTABLE ABDOMEN - 1 VIEW COMPARISON:  February 16, 2015 FINDINGS: Orogastric tube tip and side port are in the stomach. There is moderate stool throughout the colon. There is no bowel dilatation or air-fluid level to suggest bowel obstruction. No free air. There is aortic atherosclerosis. There is a small left pleural  effusion. IMPRESSION: Orogastric tube tip and side port in stomach. No bowel obstruction or free air. Moderate stool throughout colon. Small left pleural effusion. There is aortic atherosclerosis. Aortic Atherosclerosis (ICD10-I70.0). Electronically Signed   By: Lowella Grip III M.D.   On: 06/16/2017 13:33     CBC Recent Labs  Lab 06/26/2017 1215  06/24/17 0645 06/25/17 0327 06/26/17 0457 06/27/17 0456 06/28/17 0519  WBC 7.7   < > 22.5* 25.5* 15.2* 15.4* 12.3*  HGB 10.9*   < > 10.1* 9.6* 8.3* 8.1* 7.5*  HCT 35.0   < > 31.3* 30.1* 25.8* 25.6* 23.4*  PLT 101*   < > 104* 97* 72* 84* 82*  MCV 92.6   < > 89.9 89.7 89.4 89.8 90.4  MCH 28.9   < > 29.0 28.7 28.9 28.3 29.0  MCHC 31.2*   < > 32.3 32.0 32.3 31.5* 32.1  RDW 16.5*   < > 16.1* 16.8* 16.9* 16.4* 16.7*  LYMPHSABS 0.7*  --   --   --   --   --   --   MONOABS 0.8  --   --   --   --   --   --   EOSABS 0.1  --   --   --   --   --   --   BASOSABS 0.0  --   --   --   --   --   --    < > = values in this interval not displayed.    Chemistries  Recent Labs  Lab 06/24/17 0645 06/25/17 0327 06/26/17 0457 06/27/17 0456 06/28/17 0519  NA 136 135 142 145 151*  K 3.9 4.0 3.6 4.4 4.2  CL 95* 93* 104 104 110  CO2 33* 33* 33* 34* 36*  GLUCOSE 181* 243* 114* 168* 199*  BUN 45* 52* 52* 49* 52*  CREATININE 1.36* 1.36* 1.08* 0.98 0.92  CALCIUM 8.7* 8.9 8.7* 9.5 9.6  MG  --   --   --   --  2.4  AST 23  --   --   --   --   ALT 15  --   --   --   --   ALKPHOS 58  --   --   --   --   BILITOT 0.6  --   --   --   --    ------------------------------------------------------------------------------------------------------------------ estimated creatinine clearance is 38.9 mL/min (by C-G formula based on SCr of 0.92 mg/dL). ------------------------------------------------------------------------------------------------------------------ No results for input(s): HGBA1C in the last 72  hours. ------------------------------------------------------------------------------------------------------------------ Recent Labs    06/25/17 1541  TRIG 64   ------------------------------------------------------------------------------------------------------------------ No results for input(s): TSH, T4TOTAL, T3FREE, THYROIDAB in the last 72 hours.  Invalid input(s): FREET3 ------------------------------------------------------------------------------------------------------------------ No results for input(s): VITAMINB12, FOLATE, FERRITIN, TIBC, IRON, RETICCTPCT in the last 72 hours.  Coagulation profile No results for input(s): INR, PROTIME in the last 168 hours.  No results for input(s): DDIMER in the last 72 hours.  Cardiac Enzymes Recent Labs  Lab 06/12/2017 1626 06/17/2017 2232 06/23/17 0409  TROPONINI <0.03 0.04* 0.07*   ------------------------------------------------------------------------------------------------------------------ Invalid input(s): POCBNP    Assessment & Plan   Dana [name]  is a 83 y.o. female with a known history of chronic COPD, hypertension, chronic congestive heart failure on loop diuretics is brought into the ED via EMS for severe shortness of breath. Patient was waiting to see her primary care physician but as her shortness of breath has been gotten worse family decided to call EMS and patient is brought into the ED. Patient chronically lives on 3 L of oxygen but patient was extremity hypoxemic at 70% with a significant rales and wheezes. Patient Glasgow Coma Scale was at 9 by the time of arrival  #Acute encephalopathy secondary to severe hypercarbia Admitted to intensive care unit Patient is intubated Continue vent support and wean as tolerated. Sedation being turned off, patient is alert. Failed weaning yesterday                                                                           #Acute on chronic hypercarbic hypoxic respiratory  failure secondary to end-stage COPD with exacerbation, pseudomonas H CAP.'  Continue cefepime.  CBC is down from 25-15. Vent support, wean as tolerated, co wean down the steroids.,  management as per ICU attending, continue bronchodilators  #acute on  on chronic diastolic heart failure, proximal atrial fibrillation: Continue amiodarone, phenylephrine    #Hypotension; on small dose of Neo-Synephrine.   #Thrombocytopenia, subcu heparin discontinued.  Platelets stable around 70.  No evidence of bleeding.  Diabetes mellitus type 2: Continue insulin sliding scale coverage, Lantus 10 units daily.  Continue slow weaning trials.  Discussed with granddaughter.  . Prognosis poor, high risk for cardiac arrest, CODE STATUS DNR.      Possible terminal extubation next week if not improving.              Consults  intensivitst  DVT Prophylaxis  scd's  Lab Results  Component Value Date   PLT 82 (L) 06/28/2017     Time Spent in minutes 35 minutes greater than 50% of time spent in care coordination and counseling patient regarding the condition and plan of care.   Epifanio Lesches M.D on 06/28/2017 at 12:58 PM  Between 7am to 6pm - Pager - [phone]  After 6pm go to [email or web address] - password [email or web address] [facility]   Office  [phone]

## 2017-06-29 LAB — BASIC METABOLIC PANEL
ANION GAP: 7 (ref 5–15)
BUN: 63 mg/dL — ABNORMAL HIGH (ref 6–20)
CHLORIDE: 112 mmol/L — AB (ref 101–111)
CO2: 34 mmol/L — AB (ref 22–32)
Calcium: 9.4 mg/dL (ref 8.9–10.3)
Creatinine, Ser: 1.13 mg/dL — ABNORMAL HIGH (ref 0.44–1.00)
GFR calc non Af Amer: 44 mL/min — ABNORMAL LOW (ref >60)
GFR, EST AFRICAN AMERICAN: 51 mL/min — AB (ref >60)
Glucose, Bld: 209 mg/dL — ABNORMAL HIGH (ref 65–99)
Potassium: 4.5 mmol/L (ref 3.5–5.1)
SODIUM: 153 mmol/L — AB (ref 135–145)

## 2017-06-29 LAB — CBC
HEMATOCRIT: 24.6 % — AB (ref 35.0–47.0)
HEMOGLOBIN: 7.6 g/dL — AB (ref 12.0–16.0)
MCH: 28.1 pg (ref 26.0–34.0)
MCHC: 30.9 g/dL — AB (ref 32.0–36.0)
MCV: 91.1 fL (ref 80.0–100.0)
Platelets: 108 10*3/uL — ABNORMAL LOW (ref 150–440)
RBC: 2.7 MIL/uL — AB (ref 3.80–5.20)
RDW: 16.5 % — ABNORMAL HIGH (ref 11.5–14.5)
WBC: 12.9 10*3/uL — ABNORMAL HIGH (ref 3.6–11.0)

## 2017-06-29 LAB — GLUCOSE, CAPILLARY
GLUCOSE-CAPILLARY: 123 mg/dL — AB (ref 65–99)
GLUCOSE-CAPILLARY: 134 mg/dL — AB (ref 65–99)
GLUCOSE-CAPILLARY: 198 mg/dL — AB (ref 65–99)
Glucose-Capillary: 107 mg/dL — ABNORMAL HIGH (ref 65–99)
Glucose-Capillary: 155 mg/dL — ABNORMAL HIGH (ref 65–99)
Glucose-Capillary: 126 mg/dL — ABNORMAL HIGH (ref 65–99)

## 2017-06-29 MED ORDER — INSULIN ASPART 100 UNIT/ML ~~LOC~~ SOLN
2.0000 [IU] | SUBCUTANEOUS | Status: DC
  Administered 2017-06-29: 4 [IU] via SUBCUTANEOUS
  Administered 2017-06-29 – 2017-06-30 (×5): 2 [IU] via SUBCUTANEOUS
  Filled 2017-06-29 (×6): qty 1

## 2017-06-29 MED ORDER — ACETAZOLAMIDE SODIUM 500 MG IJ SOLR
500.0000 mg | Freq: Once | INTRAMUSCULAR | Status: AC
  Administered 2017-06-29: 500 mg via INTRAVENOUS
  Filled 2017-06-29: qty 500

## 2017-06-29 MED ORDER — VITAL HIGH PROTEIN PO LIQD
1000.0000 mL | ORAL | Status: DC
  Administered 2017-06-29: 1000 mL

## 2017-06-29 MED ORDER — FREE WATER
400.0000 mL | Freq: Four times a day (QID) | Status: DC
  Administered 2017-06-29 – 2017-06-30 (×4): 400 mL

## 2017-06-29 MED ORDER — SENNOSIDES-DOCUSATE SODIUM 8.6-50 MG PO TABS
2.0000 | ORAL_TABLET | Freq: Two times a day (BID) | ORAL | Status: DC
  Administered 2017-06-30 (×2): 2
  Filled 2017-06-29 (×2): qty 2

## 2017-06-29 MED ORDER — TAMSULOSIN HCL 0.4 MG PO CAPS
0.4000 mg | ORAL_CAPSULE | Freq: Every day | ORAL | Status: DC
  Administered 2017-06-29: 0.4 mg via ORAL
  Filled 2017-06-29: qty 1

## 2017-06-29 NOTE — Plan of Care (Signed)
VSS O/N, follows commands, RASS: 0, nods appropriately- but very hard of hearing. Foley put in due to acute urinary retention. Ventilator settings remain the same. No other care concerns at this time.

## 2017-06-29 NOTE — Progress Notes (Signed)
MEDICATION RELATED CONSULT NOTE   Pharmacy consulted for electrolyte/glucose/constipation management for 83 yo female ICU patient requiring mechanical intubation. Patients sedation is currently off. Goal potassium ~4, goal magnesium ~ 2.   Plan:  1. Initiate free water flushes - 400mL Q6hr. Patient received acetazolamide on 12/30 &12/31 - recheck electrolytes with am labs.   2. Transition patient to hyperglycemia protocol phase I.   3. No bowel movement documented. Patient receiving tube feeds, will initiate senna/docusate 2 tabs BID.   No Known Allergies  Patient Measurements: Height: 4\' 9"  (144.8 cm) Weight: 160 lb 4.4 oz (72.7 kg) IBW/kg (Calculated) : 38.6  Vital Signs: Temp: 98.2 F (36.8 C) (12/31 2000) Temp Source: Oral (12/31 2000) BP: 89/59 (12/31 2100) Pulse Rate: 101 (12/31 2100) Intake/Output from previous day: 12/30 0701 - 12/31 0700 In: 1440 [NG/GT:1340; IV Piggyback:100] Out: 2060 [Urine:2060] Intake/Output from this shift: Total I/O In: -  Out: 175 [Urine:175]  Labs: Recent Labs    06/27/17 0456 06/28/17 0519 06/29/17 0541 06/29/17 1418  WBC 15.4* 12.3* 12.9*  --   HGB 8.1* 7.5* 7.6*  --   HCT 25.6* 23.4* 24.6*  --   PLT 84* 82* 108*  --   CREATININE 0.98 0.92  --  1.13*  MG  --  2.4  --   --   PHOS  --  3.1  --   --    Estimated Creatinine Clearance: 31.6 mL/min (A) (by C-G formula based on SCr of 1.13 mg/dL (H)).    Medical History: Past Medical History:  Diagnosis Date  . CHF (congestive heart failure) (HCC)   . COPD (chronic obstructive pulmonary disease) [facility](HCC)     Pharmacy will continue to monitor and adjust per consult.   Jerrik Housholder L 06/29/2017,10:58 PM

## 2017-06-29 NOTE — Progress Notes (Signed)
PULMONARY / CRITICAL CARE MEDICINE   Name: Dana [name] MRN: [id number] DOB: 04/06/1933    ADMISSION DATE:  07-16-16 CONSULTATION DATE: 18-Jul-2016  REFERRING MD:  Amado CoeGouru  PT PROFILE: [age] F with end-stage COPD intubated in the ED for acute/chronic hypoxemic/hypercarbic respiratory failure and hypersomnolence.  [facility] for same.  During that hospitalization, did not require intubation but did require noninvasive ventilation for a couple of days.  MAJOR EVENTS/TEST RESULTS: 12/24 Admitted 12/25 Mild hypotension due to sedation. Low dose NE initiated. Pulmonary exam improved - weaning in PSV mode initiated 12/26 Failed SBT. CXR c/w PNA. Resp culture + for abundant pseudomonas. Abx adjusted 12/27 Palliative Care consultation: Made DNR 12/27 No progress weaning.  Developed AF with RVR.  Received amiodarone bolus and enteral amiodarone dose increased 12/28 no progress weaning.  Back in NSR.  Remains on low-dose phenylephrine 12/20 family wanted to wait and extubate later  INDWELLING DEVICES:: ETT 12/24 >>   MICRO DATA: Urine 12/24  >> NEG Resp 12/24 >> abundant Pseudomonas (pansensitive) Blood 12/25 >>   ANTIMICROBIALS:  Doxycycline 12/24 >> 12/26 Levofloxacin 12/26 X 1 Cefepime 12/26 >>   SUBJECTIVE:  Remains intubated, on full vent support Patient failed SAT/SBT due to resp muscle weakness Responded well to Diamox, will give another dose again today Off sedation Plan for extubation today when family arrives  VITAL SIGNS: BP 90/67   Pulse (!) 103   Temp 98.3 F (36.8 C) (Axillary)   Resp 15   Ht 4\' 9"  (1.448 m)   Wt 160 lb 4.4 oz (72.7 kg)   SpO2 98%   BMI 34.68 kg/m   HEMODYNAMICS:    VENTILATOR SETTINGS: Vent Mode: PRVC FiO2 (%):  [35 %-40 %] 40 % Set Rate:  [15 bmp] 15 bmp Vt Set:  [450 mL] 450 mL PEEP:  [5 cmH20] 5 cmH20 Pressure Support:  [5 cmH20] 5 cmH20 Plateau Pressure:  [19 cmH20-23 cmH20] 23  cmH20  INTAKE / OUTPUT: I/O last 3 completed shifts: In: 2176 [I.V.:136; NG/GT:1890; IV Piggyback:150] Out: 2495 [Urine:2495]    PHYSICAL EXAMINATION: General: Intubated Neuro:GCS 10T HEENT: NCAT, sclerae white Cardiovascular: Regular, no M noted Lungs: No wheezes.  Moderate mucopurulent secretions Abdomen: Obese, diminished bowel sounds, no palpable masses Extremities: Mild symmetric LE edema     LABS:  BMET Recent Labs  Lab 06/26/17 0457 06/27/17 0456 06/28/17 0519  NA 142 145 151*  K 3.6 4.4 4.2  CL 104 104 110  CO2 33* 34* 36*  BUN 52* 49* 52*  CREATININE 1.08* 0.98 0.92  GLUCOSE 114* 168* 199*    Electrolytes Recent Labs  Lab 06/26/17 0457 06/27/17 0456 06/28/17 0519  CALCIUM 8.7* 9.5 9.6  MG  --   --  2.4  PHOS  --   --  3.1    CBC Recent Labs  Lab 06/27/17 0456 06/28/17 0519 06/29/17 0541  WBC 15.4* 12.3* 12.9*  HGB 8.1* 7.5* 7.6*  HCT 25.6* 23.4* 24.6*  PLT 84* 82* 108*    Coag's No results for input(s): APTT, INR in the last 168 hours.  Sepsis Markers Recent Labs  Lab 06/23/17 0028 06/24/17 0645 06/25/17 0327  PROCALCITON <0.10 1.23 5.44    ABG Recent Labs  Lab 18-Jul-2016 1406 06/27/17 0854 06/28/17 1133  PHART 7.46* 7.35 7.36  PCO2ART 80* 72* 74*  PO2ART 68* 62* 64*    Liver Enzymes Recent Labs  Lab 06/24/17 0645  AST 23  ALT 15  ALKPHOS 58  BILITOT 0.6  ALBUMIN 2.6*    Cardiac Enzymes Recent Labs  Lab 06/11/2017 1626 06/20/2017 2232 06/23/17 0409  TROPONINI <0.03 0.04* 0.07*    Glucose Recent Labs  Lab 06/28/17 1149 06/28/17 1606 06/28/17 2006 06/29/17 0023 06/29/17 0359 06/29/17 0723  GLUCAP 136* 171* 169* 134* 107* 155*    CXR: NSC   ASSESSMENT / PLAN:  PULMONARY A: Severe resp failure with Acute/chronic hypercarbic/hypoxemic respiratory failure End-stage COPD with acute exacerbation Pseudomonas HCAP P:   Cont vent support - settings reviewed and/or adjusted PSV weaning as  tolerated Cont vent bundle Daily SBT if/when meets criteria Cont nebulized steroids and bronchodilators Failed SAT/SBT yesterday due to resp muscle weakness Plan again for SAT/SBT and trial of extubation today when family arrives Responded well to diamox therapy  CARDIOVASCULAR A:  History of diastolic heart failure Probable pulmonary hypertension Hypotension due to sedation Paroxysmal atrial fibrillation P:  Cont amiodarone at increased dose (400 mg twice daily) Continue phenylephrine to maintain MAP >65 mmHg DNR in event of cardiac arrest  RENAL A:   Severe metabolic alkalosis, improved Hypervolemia P:   Monitor BMET intermittently Monitor I/Os Correct electrolytes as indicated   GASTROINTESTINAL A:   Obesity P:   SUP: IV famotidine Continue TF protocol - initiated 12/25  HEMATOLOGIC A:   Chronic anemia Thrombocytopenia - SQ heparin DC'd 12/28 P:  DVT px: SCDs Monitor CBC intermittently Transfuse per usual guidelines   INFECTIOUS A:   Pseudomonas HCAP Elevated PCT Severe sepsis P:   Monitor temp, WBC count Micro and abx as above   ENDOCRINE A:   Mild hyperglycemia without dx of DM P:   Cont SSI while on systemic steroids Lantus initiated 12/25  NEUROLOGIC A:   Acute encephalopathy ICU/ventilator associated discomfort Poor baseline functional status P:   RASS goal: -1, -2 PAD protocol -propofol, intermittent fentanyl Per goals of care discussion, continue support for now.      Critical Care Time devoted to patient care services described in this note is 38 minutes.   Overall, patient is critically ill, prognosis is guarded.  Patient with Multiorgan failure and at high risk for cardiac arrest and death.   Plan for one way extubation after discussion with family  Lucie LeatherKurian David Celedonio Sortino, M.D.  [facility]  Medical Director [facility] Medical Director [facility] Card[facility] Department

## 2017-06-29 NOTE — Progress Notes (Signed)
[place] [facility]                                                                                                                                                                                  Patient Demographics   Dana [name], is a 83 y.o. female, DOB - July 30, 1932, SEG:[id number]  Admit date - 06/11/2017   Admitting Physician Nicholes Mango, MD  Outpatient Primary MD for the patient is Jene Every, MD   LOS - 7  Subjective:  alert, awake, patient is on full vent support, failed weaning trial yesterday because of respiratory muscle fatigue., Review of Systems:   CONSTITUTIONAL: Intubated  Vitals:   Vitals:   06/29/17 1300 06/29/17 1400 06/29/17 1500 06/29/17 1539  BP: (!) 83/49 (!) 99/55 (!) 88/52   Pulse: 87  94   Resp: 15  17   Temp:      TempSrc:      SpO2: 98%  97% 97%  Weight:      Height:        Wt Readings from Last 3 Encounters:  06/21/2017 72.7 kg (160 lb 4.4 oz)  05/31/17 65.3 kg (143 lb 14.4 oz)  04/01/16 51.7 kg (114 lb)     Intake/Output Summary (Last 24 hours) at 06/29/2017 1656 Last data filed at 06/29/2017 1500 Gross per 24 hour  Intake 1091.71 ml  Output 2510 ml  Net -1418.29 ml  Review of systems unobtainable because of intubation and sedation.  Physical Exam:   GENERAL: Intubated, critically ill HEAD, EYES, EARS, NOSE AND THROAT: Atraumatic, normocephalic.  Pupils equal and reactive to light. Sclerae anicteric. No conjunctival injection. No oro-pharyngeal erythema.  Orally intubated. NECK: Supple. There is no jugular venous distention. No bruits, no lymphadenopathy, no thyromegaly.  HEART: Regular rate and rhythm,. No murmurs, no rubs, no clicks.  LUNGS: Mostly clear to auscultation. ABDOMEN: Soft, flat, nontender, nondistended. Has good bowel sounds. No hepatosplenomegaly appreciated.  EXTREMITIES: No evidence of any cyanosis, clubbing, or peripheral edema.  +2 pedal and radial pulses bilaterally.   NEUROLOGIC:sedated SKIN: Moist and warm with no rashes appreciated.  Psych: Sedated  LN: No inguinal LN enlargement    Antibiotics   Anti-infectives (From admission, onward)   Start     Dose/Rate Route Frequency Ordered Stop   06/26/17 1500  ceFEPIme (MAXIPIME) 2 g in dextrose 5 % 50 mL IVPB     2 g 100 mL/hr over 30 Minutes Intravenous Every 12 hours 06/26/17 1427     06/26/17 1000  levofloxacin (LEVAQUIN) IVPB 750 mg  Status:  Discontinued     750 mg 100 mL/hr over 90 Minutes Intravenous Every 48 hours 06/24/17  1142 06/24/17 1551   06/24/17 1600  ceFEPIme (MAXIPIME) 1 g in dextrose 5 % 50 mL IVPB  Status:  Discontinued     1 g 100 mL/hr over 30 Minutes Intravenous Every 24 hours 06/24/17 1555 06/24/17 1557   06/24/17 1600  ceFEPIme (MAXIPIME) 2 g in dextrose 5 % 50 mL IVPB  Status:  Discontinued     2 g 100 mL/hr over 30 Minutes Intravenous Every 24 hours 06/24/17 1557 06/26/17 1427   06/24/17 1300  levofloxacin (LEVAQUIN) IVPB 750 mg     750 mg 100 mL/hr over 90 Minutes Intravenous  Once 06/24/17 1142 06/24/17 1459   06/29/2017 1630  doxycycline (VIBRAMYCIN) 100 mg in dextrose 5 % 250 mL IVPB  Status:  Discontinued     100 mg 125 mL/hr over 120 Minutes Intravenous Every 12 hours 06/19/2017 1501 06/24/17 1029      Medications   Scheduled Meds: . amiodarone  200 mg Per Tube Daily  . aspirin  81 mg Per Tube Daily  . budesonide (PULMICORT) nebulizer solution  0.25 mg Nebulization Q6H  . chlorhexidine gluconate (MEDLINE KIT)  15 mL Mouth Rinse BID  . famotidine  20 mg Per Tube Daily  . free water  400 mL Per Tube Q6H  . insulin aspart  2-6 Units Subcutaneous Q4H  . insulin glargine  10 Units Subcutaneous Daily  . ipratropium-albuterol  3 mL Nebulization Q6H  . mouth rinse  15 mL Mouth Rinse 10 times per day  . multivitamin  15 mL Per Tube Daily  . predniSONE  20 mg Per Tube Daily  . sodium chloride flush  10-40 mL Intracatheter Q12H  . sodium chloride flush  3 mL  Intravenous Q12H  . tamsulosin  0.4 mg Oral QPC supper   Continuous Infusions: . sodium chloride Stopped (06/28/17 0700)  . ceFEPime (MAXIPIME) IV Stopped (06/29/17 1451)  . feeding supplement (VITAL HIGH PROTEIN) 1,000 mL (06/29/17 1624)  . phenylephrine (NEO-SYNEPHRINE) Adult infusion 20 mcg/min (06/29/17 1500)   PRN Meds:.sodium chloride, acetaminophen, albuterol, fentaNYL (SUBLIMAZE) injection, midazolam, ondansetron (ZOFRAN) IV, sodium chloride flush, sodium chloride flush   Data Review:   Micro Results Recent Results (from the past 240 hour(s))  Culture, respiratory (NON-Expectorated)     Status: None   Collection Time: 06/12/2017  2:56 PM  Result Value Ref Range Status   Specimen Description   Final    TRACHEAL ASPIRATE Performed at [facility], [address] [place], [place] [address]    Special Requests   Final    NONE Performed at [facility], [address] [place], [place] [address]    Gram Stain   Final    ABUNDANT WBC PRESENT,BOTH PMN AND MONONUCLEAR RARE GRAM POSITIVE COCCI Performed at [facility], [place] [address], [place], [place] [address]    Culture ABUNDANT PSEUDOMONAS AERUGINOSA  Final   Report Status 06/26/2017 FINAL  Final   Organism ID, Bacteria PSEUDOMONAS AERUGINOSA  Final      Susceptibility   Pseudomonas aeruginosa - MIC*    CEFTAZIDIME 4 SENSITIVE Sensitive     CIPROFLOXACIN <=0.25 SENSITIVE Sensitive     GENTAMICIN <=1 SENSITIVE Sensitive     IMIPENEM <=0.25 SENSITIVE Sensitive     PIP/TAZO <=4 SENSITIVE Sensitive     CEFEPIME 2 SENSITIVE Sensitive     * ABUNDANT PSEUDOMONAS AERUGINOSA  CULTURE, BLOOD (ROUTINE X 2) w Reflex to ID Panel     Status: None   Collection Time: 06/23/17 12:28  AM  Result Value Ref Range Status   Specimen Description BLOOD LEFT FOREARM  Final   Special Requests   Final    BOTTLES DRAWN AEROBIC AND ANAEROBIC Blood Culture adequate volume   Culture   Final    NO GROWTH 5  DAYS Performed at [facility], [address] [place], [place] [address]    Report Status 06/28/2017 FINAL  Final  CULTURE, BLOOD (ROUTINE X 2) w Reflex to ID Panel     Status: None   Collection Time: 06/23/17 12:36 AM  Result Value Ref Range Status   Specimen Description BLOOD LEFT HAND  Final   Special Requests   Final    BOTTLES DRAWN AEROBIC AND ANAEROBIC Blood Culture adequate volume   Culture   Final    NO GROWTH 5 DAYS Performed at [facility], [address] [place], [place] [address]    Report Status 06/28/2017 FINAL  Final  Urine Culture     Status: None   Collection Time: 06/23/17 12:45 AM  Result Value Ref Range Status   Specimen Description   Final    URINE, RANDOM Performed at [facility], [address] [place], [place] [address]    Special Requests   Final    NONE Performed at [facility], [address] [place], [place] [address]    Culture   Final    NO GROWTH Performed at [facility], [place] [address], [place], [place] [address]    Report Status 06/24/2017 FINAL  Final  MRSA PCR Screening     Status: None   Collection Time: 06/23/17  7:38 PM  Result Value Ref Range Status   MRSA by PCR NEGATIVE NEGATIVE Final    Comment:        The GeneXpert MRSA Assay (FDA approved for NASAL specimens only), is one component of a comprehensive MRSA colonization surveillance program. It is not intended to diagnose MRSA infection nor to guide or monitor treatment for MRSA infections. Performed at [facility], [address] [place], [place] [address]     Radiology Reports Dg Chest [place] 1 View  Result Date: 06/28/2017 CLINICAL DATA:  Ventilator dependence. EXAM: PORTABLE CHEST 1 VIEW COMPARISON:  06/26/2017 FINDINGS: 1422 hours. Endotracheal tube tip is 4.9 cm above the base the carina. The NG tube passes into the stomach although the distal tip position is not included on the film. Right PICC line  tip overlies the mid to distal SVC level. Cardiopericardial silhouette is at upper limits of normal for size. Persistent bibasilar collapse/consolidation with patchy airspace disease in the right lung, similar to prior study. Similar appearance small bilateral effusions. The visualized bony structures of the thorax are intact. Telemetry leads overlie the chest. IMPRESSION: 1. Endotracheal tube tip 4.9 cm above the base of the carina. 2. Similar appearance of asymmetric bilateral airspace disease, right greater than left and small bilateral pleural effusions. Electronically Signed   By: Misty Stanley M.D.   On: 06/28/2017 14:38   Dg Chest Port 1 View  Result Date: 06/26/2017 CLINICAL DATA:  Respiratory failure. EXAM: PORTABLE CHEST 1 VIEW COMPARISON:  One-view chest x-ray 06/25/2017 FINDINGS: Heart is exaggerated by low lung volumes. Endotracheal tube terminates 3.5 cm above the carina, advanced since the prior exam. NG tube courses off the inferior border of the film. A right-sided PICC line is in place. Asymmetric right-sided interstitial and airspace disease continues to progress. Bibasilar airspace disease and effusions are unchanged. IMPRESSION:  1. Progressive right-sided interstitial and airspace disease representing a combination of edema and pneumonia. 2. Bilateral pleural effusions and basilar airspace disease is otherwise unchanged. 3. Stable cardiomegaly. 4. Endotracheal tube has been advanced, still 3.5 cm above the carina. Electronically Signed   By: San Morelle M.D.   On: 06/26/2017 07:05   Dg Chest Port 1 View  Result Date: 06/25/2017 CLINICAL DATA:  Respiratory failure EXAM: PORTABLE CHEST 1 VIEW COMPARISON:  06/24/2017 FINDINGS: Cardiac shadow is stable. Endotracheal tube, nasogastric catheter and right-sided PICC line are noted in satisfactory position. Extrinsic artifact is noted over the midline consistent with a bobby pin. Patchy infiltrative changes are again seen bilaterally  but predominately within the right lung no pneumothorax is seen. Small bilateral pleural effusions are again noted. IMPRESSION: Overall stable appearance of the chest when compared with the prior exam. Electronically Signed   By: Inez Catalina M.D.   On: 06/25/2017 07:39   Dg Chest Port 1 View  Result Date: 06/24/2017 CLINICAL DATA:  PICC line placement EXAM: PORTABLE CHEST 1 VIEW COMPARISON:  Earlier same day; 06/23/2017; 06/18/2017; 05/29/2017 FINDINGS: Grossly unchanged cardiac silhouette and mediastinal contours with atherosclerotic plaque within thoracic aorta. Interval placement of right upper extremity approach PICC line with tip projected over the superior cavoatrial junction. Otherwise, stable position of support apparatus. No pneumothorax. Worsening nodular airspace opacities scattered within the right lung. Interval increase in trace/ small bilateral effusions and worsening bibasilar heterogeneous / consolidative opacities, left greater than right. Unchanged bones. IMPRESSION: 1. Right upper extremity approach PICC line tip projects over the superior cavoatrial junction. 2. Otherwise, stable positioning of support apparatus. No pneumothorax. 3. Worsening bilateral airspace opacities worrisome for the progression of multifocal infection, including atypical etiologies, though note, the nodular appearing airspace opacities within the right mid lung could be seen in the setting of septic emboli. Clinical correlation is advised. Electronically Signed   By: Sandi Mariscal M.D.   On: 06/24/2017 16:55   Dg Chest Port 1 View  Result Date: 06/24/2017 CLINICAL DATA:  Hypoxia EXAM: PORTABLE CHEST 1 VIEW COMPARISON:  June 23, 2017 FINDINGS: Endotracheal tube tip is 3.1 cm above the carina. Nasogastric tube tip and side port are below the diaphragm. No pneumothorax. There are pleural effusions bilaterally with patchy airspace opacity in both lower lobes. There is cardiomegaly with mild pulmonary venous  hypertension. There is aortic atherosclerosis. No adenopathy. No bone lesions. IMPRESSION: Tubes as described without pneumothorax. Pleural effusions bilaterally with airspace opacity in both lower lobes, likely due primarily to atelectasis, although a degree of superimposed alveolar edema and/or pneumonia cannot be excluded in the lung bases. There is stable cardiomegaly with mild pulmonary venous hypertension. There is aortic atherosclerosis. Appearance is essentially stable compare to 1 day prior. Aortic Atherosclerosis (ICD10-I70.0). Electronically Signed   By: Lowella Grip III M.D.   On: 06/24/2017 07:07   Dg Chest Port 1 View  Result Date: 06/23/2017 CLINICAL DATA:  Respiratory failure EXAM: PORTABLE CHEST 1 VIEW COMPARISON:  Chest radiograph 05/30/2017 FINDINGS: Worsened aeration of the right lung base, with possible veiling pleural effusion. The endotracheal tube has been retracted and is now in satisfactory position at the level of the clavicular heads. Orogastric tube side port is in the stomach. There is calcific aortic arch atherosclerosis. Cardiomediastinal contours are unchanged. Diffuse interstitial prominence remains. IMPRESSION: 1. Satisfactory position of endotracheal tube. 2. Worsening aeration of the right lung base with possible veiling pleural effusion. Electronically Signed   By: Cletus Gash.D.  On: 06/23/2017 04:56   Dg Chest Portable 1 View  Result Date: 06/29/2017 CLINICAL DATA:  Intubation EXAM: PORTABLE CHEST 1 VIEW COMPARISON:  June 22, 2017 FINDINGS: The ETT terminates 17 mm above the carina. An OG tube terminates below today's film. Improving mild edema. Right greater than left pleural effusions with underlying opacities remain, unchanged. IMPRESSION: 1. The ETT terminates 17 mm above the carina. Recommend withdrawing 1 cm. 2. Improving edema. 3. Right greater than left pleural effusions with underlying opacities, similar in the interval. Electronically Signed    By: Dorise Bullion III M.D   On: 06/09/2017 13:34   Dg Chest Portable 1 View  Result Date: 06/18/2017 CLINICAL DATA:  Shortness of breath. EXAM: PORTABLE CHEST 1 VIEW COMPARISON:  May 29, 2017 FINDINGS: Cardiomegaly. Right greater than left pleural effusions with underlying opacities. Diffuse interstitial opacity. No other interval change. IMPRESSION: Findings are consistent with pulmonary edema. Bilateral pleural effusions with underlying opacities, right greater than left. The findings are similar in the interval. Electronically Signed   By: Dorise Bullion III M.D   On: 06/23/2017 12:36   Dg Abd Portable 1 View  Result Date: 06/01/2017 CLINICAL DATA:  Orogastric tube placement EXAM: PORTABLE ABDOMEN - 1 VIEW COMPARISON:  February 16, 2015 FINDINGS: Orogastric tube tip and side port are in the stomach. There is moderate stool throughout the colon. There is no bowel dilatation or air-fluid level to suggest bowel obstruction. No free air. There is aortic atherosclerosis. There is a small left pleural effusion. IMPRESSION: Orogastric tube tip and side port in stomach. No bowel obstruction or free air. Moderate stool throughout colon. Small left pleural effusion. There is aortic atherosclerosis. Aortic Atherosclerosis (ICD10-I70.0). Electronically Signed   By: Lowella Grip III M.D.   On: 06/13/2017 13:33     CBC Recent Labs  Lab 06/25/17 0327 06/26/17 0457 06/27/17 0456 06/28/17 0519 06/29/17 0541  WBC 25.5* 15.2* 15.4* 12.3* 12.9*  HGB 9.6* 8.3* 8.1* 7.5* 7.6*  HCT 30.1* 25.8* 25.6* 23.4* 24.6*  PLT 97* 72* 84* 82* 108*  MCV 89.7 89.4 89.8 90.4 91.1  MCH 28.7 28.9 28.3 29.0 28.1  MCHC 32.0 32.3 31.5* 32.1 30.9*  RDW 16.8* 16.9* 16.4* 16.7* 16.5*    Chemistries  Recent Labs  Lab 06/24/17 0645 06/25/17 0327 06/26/17 0457 06/27/17 0456 06/28/17 0519 06/29/17 1418  NA 136 135 142 145 151* 153*  K 3.9 4.0 3.6 4.4 4.2 4.5  CL 95* 93* 104 104 110 112*  CO2 33* 33* 33* 34*  36* 34*  GLUCOSE 181* 243* 114* 168* 199* 209*  BUN 45* 52* 52* 49* 52* 63*  CREATININE 1.36* 1.36* 1.08* 0.98 0.92 1.13*  CALCIUM 8.7* 8.9 8.7* 9.5 9.6 9.4  MG  --   --   --   --  2.4  --   AST 23  --   --   --   --   --   ALT 15  --   --   --   --   --   ALKPHOS 58  --   --   --   --   --   BILITOT 0.6  --   --   --   --   --    ------------------------------------------------------------------------------------------------------------------ estimated creatinine clearance is 31.6 mL/min (A) (by C-G formula based on SCr of 1.13 mg/dL (H)). ------------------------------------------------------------------------------------------------------------------ No results for input(s): HGBA1C in the last 72 hours. ------------------------------------------------------------------------------------------------------------------ No results for input(s): CHOL, HDL, LDLCALC, TRIG, CHOLHDL, LDLDIRECT in  the last 72 hours. ------------------------------------------------------------------------------------------------------------------ No results for input(s): TSH, T4TOTAL, T3FREE, THYROIDAB in the last 72 hours.  Invalid input(s): FREET3 ------------------------------------------------------------------------------------------------------------------ No results for input(s): VITAMINB12, FOLATE, FERRITIN, TIBC, IRON, RETICCTPCT in the last 72 hours.  Coagulation profile No results for input(s): INR, PROTIME in the last 168 hours.  No results for input(s): DDIMER in the last 72 hours.  Cardiac Enzymes Recent Labs  Lab 06/02/2017 2232 06/23/17 0409  TROPONINI 0.04* 0.07*   ------------------------------------------------------------------------------------------------------------------ Invalid input(s): POCBNP    Assessment & Plan   Dana [name]  is a 83 y.o. female with a known history of chronic COPD, hypertension, chronic congestive heart failure on loop diuretics is brought into the ED  via EMS for severe shortness of breath. Patient was waiting to see her primary care physician but as her shortness of breath has been gotten worse family decided to call EMS and patient is brought into the ED. Patient chronically lives on 3 L of oxygen but patient was extremity hypoxemic at 70% with a significant rales and wheezes. Patient Glasgow Coma Scale was at 9 by the time of arrival  #Acute encephalopathy secondary to severe hypercarbia Admitted to intensive care unit Patient is intubated Continue vent support and wean as tolerated. Sedation being turned off, patient is alert. Failed weaning yesterday                                                                           #Acute on chronic hypercarbic hypoxic respiratory failure secondary to end-stage COPD with exacerbation, pseudomonas H CAP.'  Continue cefepime.  CBC is down from 25-15. Vent support, wean as tolerated, co wean down the steroids.,  management as per ICU attending, continue bronchodilators Pseudomonas pneumonia: Patient is on Diamox for fluids, wean trial and extubation as per critical care physician note.   #acute on  on chronic diastolic heart failure, proximal atrial fibrillation: Continue amiodarone, phenylephrine    #Hypotension; on small dose of Neo-Synephrine.   #Thrombocytopenia, subcu heparin discontinued.  Platelets stable around 70.  No evidence of bleeding.  Diabetes mellitus type 2: Continue insulin sliding scale coverage, Lantus 10 units daily. Receiving Diamox.  Continue slow weaning trials.  Discussed with granddaughter.  . Prognosis poor, high risk for cardiac arrest, CODE STATUS DNR.      Possible terminal extubation next week if not improving.              Consults  intensivitst  DVT Prophylaxis  scd's  Lab Results  Component Value Date   PLT 108 (L) 06/29/2017     Time Spent in minutes 35 minutes greater than 50% of time spent in care coordination and counseling  patient regarding the condition and plan of care.   Epifanio Lesches M.D on 06/29/2017 at 4:56 PM  Between 7am to 6pm - Pager - [phone]  After 6pm go to [email or web address] - password [email or web address] [facility]   Office  [phone]

## 2017-06-29 NOTE — Progress Notes (Signed)
SBT from 0800-1200. Family thought they would extubate with plans not to reintubate today. They spoke with palliative care at length earlier. But once the final decision to extubate was at hand, they asked to wait another day. Dr Belia HemanKasa also spoke with them at length. Pt has CAM+ delerium score. When writes notes, they are random letters without any words formed. She follows commands. But she is unable to knowingly participate in discussion of goals for her care.

## 2017-06-29 NOTE — Progress Notes (Addendum)
Daily Progress Note   Patient Name: Dana [name]       Date: 06/29/2017 DOB: [birth year]  Age: 83 y.o. MRN#: [id number] Attending Physician: Epifanio Lesches, MD Primary Care Physician: Jene Every, MD Admit Date: 06/21/2017  Reason for Consultation/Follow-up: Establishing goals of care  Subjective: Ms. Ingman is resting in bed intubated.  Granddaughter at bedside. Plans for 1 way extubation today when family arrives. Per conversation with CCM, potential for BIPAP depending on how she does with extubation.     Length of Stay: 7  Current Medications: Scheduled Meds:  . amiodarone  200 mg Per Tube Daily  . aspirin  81 mg Per Tube Daily  . budesonide (PULMICORT) nebulizer solution  0.25 mg Nebulization Q6H  . chlorhexidine gluconate (MEDLINE KIT)  15 mL Mouth Rinse BID  . famotidine  20 mg Per Tube Daily  . insulin aspart  0-15 Units Subcutaneous Q4H  . insulin glargine  10 Units Subcutaneous Daily  . ipratropium-albuterol  3 mL Nebulization Q6H  . mouth rinse  15 mL Mouth Rinse 10 times per day  . multivitamin  15 mL Per Tube Daily  . predniSONE  20 mg Per Tube Daily  . sodium chloride flush  10-40 mL Intracatheter Q12H  . sodium chloride flush  3 mL Intravenous Q12H  . tamsulosin  0.4 mg Oral QPC supper    Continuous Infusions: . sodium chloride Stopped (06/28/17 0700)  . ceFEPime (MAXIPIME) IV Stopped (06/29/17 0232)  . feeding supplement (VITAL HIGH PROTEIN) Stopped (06/29/17 0800)  . phenylephrine (NEO-SYNEPHRINE) Adult infusion Stopped (06/27/17 1430)    PRN Meds: sodium chloride, acetaminophen, albuterol, fentaNYL (SUBLIMAZE) injection, midazolam, ondansetron (ZOFRAN) IV, sodium chloride flush, sodium chloride flush  Physical Exam  Constitutional: No  distress.  Pulmonary/Chest:  Intubated            Vital Signs: BP (!) 75/42   Pulse 91   Temp 98.3 F (36.8 C) (Axillary)   Resp 18   Ht 4' 9"  (1.448 m)   Wt 72.7 kg (160 lb 4.4 oz)   SpO2 98%   BMI 34.68 kg/m  SpO2: SpO2: 98 % O2 Device: O2 Device: Ventilator O2 Flow Rate: O2 Flow Rate (L/min): 6 L/min  Intake/output summary:   Intake/Output Summary (Last 24 hours) at 06/29/2017 1226 Last data filed at 06/29/2017  0700 Gross per 24 hour  Intake 1080 ml  Output 2060 ml  Net -980 ml   LBM: Last BM Date: 06/28/17 Baseline Weight: Weight: 68 kg (150 lb) Most recent weight: Weight: 72.7 kg (160 lb 4.4 oz)       Palliative Assessment/Data: 10%    Flowsheet Rows     Most Recent Value  Intake Tab  Referral Department  Hospitalist  Unit at Time of Referral  Med/Surg Unit  Palliative Care Primary Diagnosis  Pulmonary  Date Notified  06/23/17  Palliative Care Type  Return patient Palliative Care  Reason for referral  Clarify Goals of Care, End of Life Care Assistance  Date of Admission  06/09/2017  Date first seen by Palliative Care  06/24/17  # of days Palliative referral response time  1 Day(s)  # of days IP prior to Palliative referral  1  Clinical Assessment  Psychosocial & Spiritual Assessment  Palliative Care Outcomes      Patient Active Problem List   Diagnosis Date Noted  . Acute respiratory failure with hypoxia ([place]) 06/08/2017  . DNR (do not resuscitate) discussion   . Palliative care by specialist   . COPD exacerbation ([place])   . Sepsis ([place]) 05/26/2017  . Acute respiratory failure with hypoxia and hypercarbia ([facility])   . Acute on chronic congestive heart failure ([place])   . Respiratory failure ([place]) 03/24/2016  . CAP (community acquired pneumonia)   . Demand ischemia [facility])     Palliative Care Assessment & Plan   Patient Profile:  89 F withend-stage COPD intubated in the ED for respiratory failure and hypersomnolence. Recently discharged from [facility]  after hospitalization for the same. During prior hospitalization, did not require intubation but did require noninvasive ventilation for a couple of days.     Assessment: Patient remains intubated.  Recommendations/Plan:  Plans for 1 way extubation today after family arrives. May involve BIPAP depending on how she does.     Code Status:    Code Status Orders  (From admission, onward)        Start     Ordered   06/25/17 1151  Do not attempt resuscitation (DNR)  Continuous    Question Answer Comment  In the event of cardiac or respiratory ARREST Do not call a "code blue"   In the event of cardiac or respiratory ARREST Do not perform Intubation, CPR, defibrillation or ACLS   In the event of cardiac or respiratory ARREST Use medication by any route, position, wound care, and other measures to relive pain and suffering. May use oxygen, suction and manual treatment of airway obstruction as needed for comfort.      06/25/17 1150    Code Status History    Date Active Date Inactive Code Status Order ID Comments User Context   06/05/2017 16:11 06/25/2017 11:50 Full Code [id number]  Nicholes Mango, MD Inpatient   05/26/2017 20:34 06/01/2017 18:21 Full Code [id number]  Harrie Foreman, MD Inpatient   03/24/2016 16:47 04/01/2016 20:42 Full Code [id number]  de Dios, Mike Gip, MD Inpatient       Prognosis:   < 2 weeks Depending on how she does with discontinuation from ventilator.   Discharge Planning:  To Be Determined   Thank you for allowing the Palliative Medicine Team to assist in the care of this patient.   Time In: 9:00 Time Out: 9:35 Total Time 35 min Prolonged Time Billed No  Per nursing, family running late for meeting at bedside.  Greater than 50%  of this time was spent counseling and coordinating care related to the above assessment and plan.  Asencion Gowda, NP  Please contact Palliative Medicine Team phone at [phone] for questions and concerns.

## 2017-06-30 LAB — BASIC METABOLIC PANEL
Anion gap: 7 (ref 5–15)
BUN: 54 mg/dL — ABNORMAL HIGH (ref 6–20)
CHLORIDE: 113 mmol/L — AB (ref 101–111)
CO2: 33 mmol/L — AB (ref 22–32)
CREATININE: 1.08 mg/dL — AB (ref 0.44–1.00)
Calcium: 9.2 mg/dL (ref 8.9–10.3)
GFR calc non Af Amer: 46 mL/min — ABNORMAL LOW (ref >60)
GFR, EST AFRICAN AMERICAN: 54 mL/min — AB (ref >60)
Glucose, Bld: 140 mg/dL — ABNORMAL HIGH (ref 65–99)
Potassium: 3.6 mmol/L (ref 3.5–5.1)
Sodium: 153 mmol/L — ABNORMAL HIGH (ref 135–145)

## 2017-06-30 LAB — GLUCOSE, CAPILLARY
GLUCOSE-CAPILLARY: 75 mg/dL (ref 65–99)
GLUCOSE-CAPILLARY: 94 mg/dL (ref 65–99)
Glucose-Capillary: 117 mg/dL — ABNORMAL HIGH (ref 65–99)
Glucose-Capillary: 122 mg/dL — ABNORMAL HIGH (ref 65–99)
Glucose-Capillary: 137 mg/dL — ABNORMAL HIGH (ref 65–99)
Glucose-Capillary: 140 mg/dL — ABNORMAL HIGH (ref 65–99)
Glucose-Capillary: 147 mg/dL — ABNORMAL HIGH (ref 65–99)

## 2017-06-30 LAB — MAGNESIUM: Magnesium: 2.6 mg/dL — ABNORMAL HIGH (ref 1.7–2.4)

## 2017-06-30 MED ORDER — POTASSIUM CHLORIDE 20 MEQ/15ML (10%) PO SOLN
40.0000 meq | Freq: Once | ORAL | Status: AC
  Administered 2017-06-30: 40 meq
  Filled 2017-06-30 (×2): qty 30

## 2017-06-30 NOTE — Progress Notes (Signed)
Patient on PSV mode, family at bedside. Alert,awake- patient nods yes and would like her ETT out and removed.  Family witnessed patients response and are in agreement. Plan for extubation and assess resp status, patient is DNR/DNI  This will be one way extubation-if patient decompensates-will proceed with comfort care measures   Family are satisfied with Plan of action and management. All questions answered  Lucie LeatherKurian David Gearl Kimbrough, M.D.  [facility]  Medical Director [facility] Medical Director [facility]

## 2017-06-30 NOTE — Progress Notes (Signed)
[place] at [facility]                                                                                                                                                                                  Patient Demographics   Dana [name], is a 84 y.o. female, DOB - 1933/02/18, FGH:[id number]  Admit date - 06/09/2017   Admitting Physician Nicholes Mango, MD  Outpatient Primary MD for the patient is Jene Every, MD   LOS - 8  Subjective: Extubated today.  Alert.  Family at bedside. Review of Systems:   CONSTITUTIONAL: Intubated  Vitals:   Vitals:   06/30/17 1155 06/30/17 1156 06/30/17 1200 06/30/17 1300  BP:   (!) 106/59 105/74  Pulse: (!) 108  (!) 113 (!) 108  Resp: (!) 26  (!) 24 (!) 25  Temp:   99.4 F (37.4 C)   TempSrc:   Axillary   SpO2: (!) 86% (!) 80% 98% 97%  Weight:      Height:        Wt Readings from Last 3 Encounters:  06/21/2017 72.7 kg (160 lb 4.4 oz)  05/31/17 65.3 kg (143 lb 14.4 oz)  04/01/16 51.7 kg (114 lb)     Intake/Output Summary (Last 24 hours) at 06/30/2017 1306 Last data filed at 06/30/2017 0900 Gross per 24 hour  Intake 1341.76 ml  Output 1175 ml  Net 166.76 ml  Extubated, off sedation but he appears comfortable and denies any complaints..  Physical Exam:   GENERAL: Intubated, critically ill HEAD, EYES, EARS, NOSE AND THROAT: Atraumatic, normocephalic.  Pupils equal and reactive to light. Sclerae anicteric. No conjunctival injection. No oro-pharyngeal erythema.  Extubated, patient is on Ventimask now.   NECK: Supple. There is no jugular venous distention. No bruits, no lymphadenopathy, no thyromegaly.  HEART: Regular rate and rhythm,. No murmurs, no rubs, no clicks.  LUNGS: Mostly clear to auscultation. ABDOMEN: Soft, flat, nontender, nondistended. Has good bowel sounds. No hepatosplenomegaly appreciated.  EXTREMITIES: No evidence of any cyanosis, clubbing, or peripheral edema.  +2 pedal and radial pulses  bilaterally.  NEUROLOGIC:sedated SKIN: Moist and warm with no rashes appreciated.  Psych: Sedated  LN: No inguinal LN enlargement    Antibiotics   Anti-infectives (From admission, onward)   Start     Dose/Rate Route Frequency Ordered Stop   06/26/17 1500  ceFEPIme (MAXIPIME) 2 g in dextrose 5 % 50 mL IVPB     2 g 100 mL/hr over 30 Minutes Intravenous Every 12 hours 06/26/17 1427     06/26/17 1000  levofloxacin (LEVAQUIN) IVPB 750 mg  Status:  Discontinued     750 mg 100 mL/hr over  90 Minutes Intravenous Every 48 hours 06/24/17 1142 06/24/17 1551   06/24/17 1600  ceFEPIme (MAXIPIME) 1 g in dextrose 5 % 50 mL IVPB  Status:  Discontinued     1 g 100 mL/hr over 30 Minutes Intravenous Every 24 hours 06/24/17 1555 06/24/17 1557   06/24/17 1600  ceFEPIme (MAXIPIME) 2 g in dextrose 5 % 50 mL IVPB  Status:  Discontinued     2 g 100 mL/hr over 30 Minutes Intravenous Every 24 hours 06/24/17 1557 06/26/17 1427   06/24/17 1300  levofloxacin (LEVAQUIN) IVPB 750 mg     750 mg 100 mL/hr over 90 Minutes Intravenous  Once 06/24/17 1142 06/24/17 1459   06/25/2017 1630  doxycycline (VIBRAMYCIN) 100 mg in dextrose 5 % 250 mL IVPB  Status:  Discontinued     100 mg 125 mL/hr over 120 Minutes Intravenous Every 12 hours 05/30/2017 1501 06/24/17 1029      Medications   Scheduled Meds: . amiodarone  200 mg Per Tube Daily  . aspirin  81 mg Per Tube Daily  . budesonide (PULMICORT) nebulizer solution  0.25 mg Nebulization Q6H  . chlorhexidine gluconate (MEDLINE KIT)  15 mL Mouth Rinse BID  . famotidine  20 mg Per Tube Daily  . free water  400 mL Per Tube Q6H  . insulin aspart  2-6 Units Subcutaneous Q4H  . insulin glargine  10 Units Subcutaneous Daily  . ipratropium-albuterol  3 mL Nebulization Q6H  . mouth rinse  15 mL Mouth Rinse 10 times per day  . multivitamin  15 mL Per Tube Daily  . predniSONE  20 mg Per Tube Daily  . senna-docusate  2 tablet Per Tube BID  . sodium chloride flush  10-40 mL  Intracatheter Q12H  . sodium chloride flush  3 mL Intravenous Q12H  . tamsulosin  0.4 mg Oral QPC supper   Continuous Infusions: . sodium chloride Stopped (06/28/17 0700)  . ceFEPime (MAXIPIME) IV Stopped (06/30/17 0400)  . feeding supplement (VITAL HIGH PROTEIN) 1,000 mL (06/30/17 0900)  . phenylephrine (NEO-SYNEPHRINE) Adult infusion 25.067 mcg/min (06/30/17 0900)   PRN Meds:.sodium chloride, acetaminophen, albuterol, fentaNYL (SUBLIMAZE) injection, midazolam, ondansetron (ZOFRAN) IV, sodium chloride flush, sodium chloride flush   Data Review:   Micro Results Recent Results (from the past 240 hour(s))  Culture, respiratory (NON-Expectorated)     Status: None   Collection Time: 06/08/2017  2:56 PM  Result Value Ref Range Status   Specimen Description   Final    TRACHEAL ASPIRATE Performed at [facility], [address] [place], [place] [address]    Special Requests   Final    NONE Performed at [facility], [address], [place], [place] [address]    Gram Stain   Final    ABUNDANT WBC PRESENT,BOTH PMN AND MONONUCLEAR RARE GRAM POSITIVE COCCI Performed at [facility], [place] [address] [place], [place] [address]    Culture ABUNDANT PSEUDOMONAS AERUGINOSA  Final   Report Status 06/26/2017 FINAL  Final   Organism ID, Bacteria PSEUDOMONAS AERUGINOSA  Final      Susceptibility   Pseudomonas aeruginosa - MIC*    CEFTAZIDIME 4 SENSITIVE Sensitive     CIPROFLOXACIN <=0.25 SENSITIVE Sensitive     GENTAMICIN <=1 SENSITIVE Sensitive     IMIPENEM <=0.25 SENSITIVE Sensitive     PIP/TAZO <=4 SENSITIVE Sensitive     CEFEPIME 2 SENSITIVE Sensitive     * ABUNDANT PSEUDOMONAS AERUGINOSA  CULTURE, BLOOD (ROUTINE X 2) w  Reflex to ID Panel     Status: None   Collection Time: 06/23/17 12:28 AM  Result Value Ref Range Status   Specimen Description BLOOD LEFT FOREARM  Final   Special Requests   Final    BOTTLES DRAWN AEROBIC AND ANAEROBIC Blood Culture adequate  volume   Culture   Final    NO GROWTH 5 DAYS Performed at [facility], [address] [place], [place] [address]    Report Status 06/28/2017 FINAL  Final  CULTURE, BLOOD (ROUTINE X 2) w Reflex to ID Panel     Status: None   Collection Time: 06/23/17 12:36 AM  Result Value Ref Range Status   Specimen Description BLOOD LEFT HAND  Final   Special Requests   Final    BOTTLES DRAWN AEROBIC AND ANAEROBIC Blood Culture adequate volume   Culture   Final    NO GROWTH 5 DAYS Performed at [id number], [address] [place], [place] [address]    Report Status 06/28/2017 FINAL  Final  Urine Culture     Status: None   Collection Time: 06/23/17 12:45 AM  Result Value Ref Range Status   Specimen Description   Final    URINE, RANDOM Performed at [facility], [address] [place], [place] [address]    Special Requests   Final    NONE Performed at [facility], [address], [place], [place] [address]    Culture   Final    NO GROWTH Performed at [facility], [place] [address] [place], [place] [address]    Report Status 06/24/2017 FINAL  Final  MRSA PCR Screening     Status: None   Collection Time: 06/23/17  7:38 PM  Result Value Ref Range Status   MRSA by PCR NEGATIVE NEGATIVE Final    Comment:        The GeneXpert MRSA Assay (FDA approved for NASAL specimens only), is one component of a comprehensive MRSA colonization surveillance program. It is not intended to diagnose MRSA infection nor to guide or monitor treatment for MRSA infections. Performed at [facility], [address] [place], [place] [address]     Radiology Reports Dg Chest [place] 1 View  Result Date: 06/28/2017 CLINICAL DATA:  Ventilator dependence. EXAM: PORTABLE CHEST 1 VIEW COMPARISON:  06/26/2017 FINDINGS: 1422 hours. Endotracheal tube tip is 4.9 cm above the base the carina. The NG tube passes into the stomach although the distal tip position is  not included on the film. Right PICC line tip overlies the mid to distal SVC level. Cardiopericardial silhouette is at upper limits of normal for size. Persistent bibasilar collapse/consolidation with patchy airspace disease in the right lung, similar to prior study. Similar appearance small bilateral effusions. The visualized bony structures of the thorax are intact. Telemetry leads overlie the chest. IMPRESSION: 1. Endotracheal tube tip 4.9 cm above the base of the carina. 2. Similar appearance of asymmetric bilateral airspace disease, right greater than left and small bilateral pleural effusions. Electronically Signed   By: Misty Stanley M.D.   On: 06/28/2017 14:38   Dg Chest Port 1 View  Result Date: 06/26/2017 CLINICAL DATA:  Respiratory failure. EXAM: PORTABLE CHEST 1 VIEW COMPARISON:  One-view chest x-ray 06/25/2017 FINDINGS: Heart is exaggerated by low lung volumes. Endotracheal tube terminates 3.5 cm above the carina, advanced since the prior exam. NG tube courses off the inferior border of the film. A right-sided PICC line is in place. Asymmetric  right-sided interstitial and airspace disease continues to progress. Bibasilar airspace disease and effusions are unchanged. IMPRESSION: 1. Progressive right-sided interstitial and airspace disease representing a combination of edema and pneumonia. 2. Bilateral pleural effusions and basilar airspace disease is otherwise unchanged. 3. Stable cardiomegaly. 4. Endotracheal tube has been advanced, still 3.5 cm above the carina. Electronically Signed   By: San Morelle M.D.   On: 06/26/2017 07:05   Dg Chest Port 1 View  Result Date: 06/25/2017 CLINICAL DATA:  Respiratory failure EXAM: PORTABLE CHEST 1 VIEW COMPARISON:  06/24/2017 FINDINGS: Cardiac shadow is stable. Endotracheal tube, nasogastric catheter and right-sided PICC line are noted in satisfactory position. Extrinsic artifact is noted over the midline consistent with a bobby pin. Patchy  infiltrative changes are again seen bilaterally but predominately within the right lung no pneumothorax is seen. Small bilateral pleural effusions are again noted. IMPRESSION: Overall stable appearance of the chest when compared with the prior exam. Electronically Signed   By: Inez Catalina M.D.   On: 06/25/2017 07:39   Dg Chest Port 1 View  Result Date: 06/24/2017 CLINICAL DATA:  PICC line placement EXAM: PORTABLE CHEST 1 VIEW COMPARISON:  Earlier same day; 06/23/2017; 06/20/2017; 05/29/2017 FINDINGS: Grossly unchanged cardiac silhouette and mediastinal contours with atherosclerotic plaque within thoracic aorta. Interval placement of right upper extremity approach PICC line with tip projected over the superior cavoatrial junction. Otherwise, stable position of support apparatus. No pneumothorax. Worsening nodular airspace opacities scattered within the right lung. Interval increase in trace/ small bilateral effusions and worsening bibasilar heterogeneous / consolidative opacities, left greater than right. Unchanged bones. IMPRESSION: 1. Right upper extremity approach PICC line tip projects over the superior cavoatrial junction. 2. Otherwise, stable positioning of support apparatus. No pneumothorax. 3. Worsening bilateral airspace opacities worrisome for the progression of multifocal infection, including atypical etiologies, though note, the nodular appearing airspace opacities within the right mid lung could be seen in the setting of septic emboli. Clinical correlation is advised. Electronically Signed   By: Sandi Mariscal M.D.   On: 06/24/2017 16:55   Dg Chest Port 1 View  Result Date: 06/24/2017 CLINICAL DATA:  Hypoxia EXAM: PORTABLE CHEST 1 VIEW COMPARISON:  June 23, 2017 FINDINGS: Endotracheal tube tip is 3.1 cm above the carina. Nasogastric tube tip and side port are below the diaphragm. No pneumothorax. There are pleural effusions bilaterally with patchy airspace opacity in both lower lobes. There  is cardiomegaly with mild pulmonary venous hypertension. There is aortic atherosclerosis. No adenopathy. No bone lesions. IMPRESSION: Tubes as described without pneumothorax. Pleural effusions bilaterally with airspace opacity in both lower lobes, likely due primarily to atelectasis, although a degree of superimposed alveolar edema and/or pneumonia cannot be excluded in the lung bases. There is stable cardiomegaly with mild pulmonary venous hypertension. There is aortic atherosclerosis. Appearance is essentially stable compare to 1 day prior. Aortic Atherosclerosis (ICD10-I70.0). Electronically Signed   By: Lowella Grip III M.D.   On: 06/24/2017 07:07   Dg Chest Port 1 View  Result Date: 06/23/2017 CLINICAL DATA:  Respiratory failure EXAM: PORTABLE CHEST 1 VIEW COMPARISON:  Chest radiograph 05/30/2017 FINDINGS: Worsened aeration of the right lung base, with possible veiling pleural effusion. The endotracheal tube has been retracted and is now in satisfactory position at the level of the clavicular heads. Orogastric tube side port is in the stomach. There is calcific aortic arch atherosclerosis. Cardiomediastinal contours are unchanged. Diffuse interstitial prominence remains. IMPRESSION: 1. Satisfactory position of endotracheal tube. 2. Worsening aeration of the right  lung base with possible veiling pleural effusion. Electronically Signed   By: Ulyses Jarred M.D.   On: 06/23/2017 04:56   Dg Chest Portable 1 View  Result Date: 06/21/2017 CLINICAL DATA:  Intubation EXAM: PORTABLE CHEST 1 VIEW COMPARISON:  June 22, 2017 FINDINGS: The ETT terminates 17 mm above the carina. An OG tube terminates below today's film. Improving mild edema. Right greater than left pleural effusions with underlying opacities remain, unchanged. IMPRESSION: 1. The ETT terminates 17 mm above the carina. Recommend withdrawing 1 cm. 2. Improving edema. 3. Right greater than left pleural effusions with underlying opacities,  similar in the interval. Electronically Signed   By: Dorise Bullion III M.D   On: 06/07/2017 13:34   Dg Chest Portable 1 View  Result Date: 06/21/2017 CLINICAL DATA:  Shortness of breath. EXAM: PORTABLE CHEST 1 VIEW COMPARISON:  May 29, 2017 FINDINGS: Cardiomegaly. Right greater than left pleural effusions with underlying opacities. Diffuse interstitial opacity. No other interval change. IMPRESSION: Findings are consistent with pulmonary edema. Bilateral pleural effusions with underlying opacities, right greater than left. The findings are similar in the interval. Electronically Signed   By: Dorise Bullion III M.D   On: 06/08/2017 12:36   Dg Abd Portable 1 View  Result Date: 06/21/2017 CLINICAL DATA:  Orogastric tube placement EXAM: PORTABLE ABDOMEN - 1 VIEW COMPARISON:  February 16, 2015 FINDINGS: Orogastric tube tip and side port are in the stomach. There is moderate stool throughout the colon. There is no bowel dilatation or air-fluid level to suggest bowel obstruction. No free air. There is aortic atherosclerosis. There is a small left pleural effusion. IMPRESSION: Orogastric tube tip and side port in stomach. No bowel obstruction or free air. Moderate stool throughout colon. Small left pleural effusion. There is aortic atherosclerosis. Aortic Atherosclerosis (ICD10-I70.0). Electronically Signed   By: Lowella Grip III M.D.   On: 05/31/2017 13:33     CBC Recent Labs  Lab 06/25/17 0327 06/26/17 0457 06/27/17 0456 06/28/17 0519 06/29/17 0541  WBC 25.5* 15.2* 15.4* 12.3* 12.9*  HGB 9.6* 8.3* 8.1* 7.5* 7.6*  HCT 30.1* 25.8* 25.6* 23.4* 24.6*  PLT 97* 72* 84* 82* 108*  MCV 89.7 89.4 89.8 90.4 91.1  MCH 28.7 28.9 28.3 29.0 28.1  MCHC 32.0 32.3 31.5* 32.1 30.9*  RDW 16.8* 16.9* 16.4* 16.7* 16.5*    Chemistries  Recent Labs  Lab 06/24/17 0645  06/26/17 0457 06/27/17 0456 06/28/17 0519 06/29/17 1418 06/30/17 0425  NA 136   < > 142 145 151* 153* 153*  K 3.9   < > 3.6 4.4  4.2 4.5 3.6  CL 95*   < > 104 104 110 112* 113*  CO2 33*   < > 33* 34* 36* 34* 33*  GLUCOSE 181*   < > 114* 168* 199* 209* 140*  BUN 45*   < > 52* 49* 52* 63* 54*  CREATININE 1.36*   < > 1.08* 0.98 0.92 1.13* 1.08*  CALCIUM 8.7*   < > 8.7* 9.5 9.6 9.4 9.2  MG  --   --   --   --  2.4  --  2.6*  AST 23  --   --   --   --   --   --   ALT 15  --   --   --   --   --   --   ALKPHOS 58  --   --   --   --   --   --  BILITOT 0.6  --   --   --   --   --   --    < > = values in this interval not displayed.   ------------------------------------------------------------------------------------------------------------------ estimated creatinine clearance is 33.1 mL/min (A) (by C-G formula based on SCr of 1.08 mg/dL (H)). ------------------------------------------------------------------------------------------------------------------ No results for input(s): HGBA1C in the last 72 hours. ------------------------------------------------------------------------------------------------------------------ No results for input(s): CHOL, HDL, LDLCALC, TRIG, CHOLHDL, LDLDIRECT in the last 72 hours. ------------------------------------------------------------------------------------------------------------------ No results for input(s): TSH, T4TOTAL, T3FREE, THYROIDAB in the last 72 hours.  Invalid input(s): FREET3 ------------------------------------------------------------------------------------------------------------------ No results for input(s): VITAMINB12, FOLATE, FERRITIN, TIBC, IRON, RETICCTPCT in the last 72 hours.  Coagulation profile No results for input(s): INR, PROTIME in the last 168 hours.  No results for input(s): DDIMER in the last 72 hours.  Cardiac Enzymes No results for input(s): CKMB, TROPONINI, MYOGLOBIN in the last 168 hours.  Invalid input(s): CK ------------------------------------------------------------------------------------------------------------------ Invalid input(s):  Fertile   Dana [name]  is a 84 y.o. female with a known history of chronic COPD, hypertension, chronic congestive heart failure on loop diuretics is brought into the ED via EMS for severe shortness of breath. Patient was waiting to see her primary care physician but as her shortness of breath has been gotten worse family decided to call EMS and patient is brought into the ED. Patient chronically lives on 3 L of oxygen but patient was extremity hypoxemic at 70% with a significant rales and wheezes. Patient Glasgow Coma Scale was at 9 by the time of arrival  #Acute encephalopathy secondary to severe hypercarbia Admitted to intensive care unit Extubated, she is alert, awake. Continue to monitor closely,                                                                            #Acute on chronic hypercarbic hypoxic respiratory failure secondary to end-stage COPD with exacerbation, pseudomonas H CAP.'  Continue cefepime.  CBC is down from 25-15.  Pseudomonas pneumonia: Continue cefepime. #acute on  on chronic diastolic heart failure, proximal atrial fibrillation: Continue amiodarone, phenylephrine   Hyper natremia: Continue free water flushes.  Monitor sodium   .#Thrombocytopenia, subcu heparin discontinued.  Platelets stable around 70.  No evidence of bleeding.  Diabetes mellitus type 2: Continue insulin sliding scale coverage, Lantus 10 units daily.     .              Consults  intensivitst  DVT Prophylaxis  scd's  Lab Results  Component Value Date   PLT 108 (L) 06/29/2017     Time Spent in minutes 35 minutes greater than 50% of time spent in care coordination and counseling patient regarding the condition and plan of care.   Epifanio Lesches M.D on 06/30/2017 at 1:06 PM  Between 7am to 6pm - Pager - [phone]  After 6pm go to [email or web address] - password [email or web address] [facility]   Office  [phone]

## 2017-06-30 NOTE — Progress Notes (Signed)
I spoke with the patient's daughter Liborio NixonJanice, who lives with the patient, to get a better understanding of the patient's baseline mental status, motor function, and speech.  The daughter states that her and her family noticed a change with the patient's speech and right arm strength on approximately December 17th while at home. Liborio NixonJanice states that the patient struggled to hold items in her right hand and she had trouble articulating her speech as well as she used to. At the time, the family researched side effects of the new medication she was taking (believed to be amiodarone) and noticed that muscle weakness was a side effect. Therefore, the family did not seek medical attention for the new symptoms. Over the course of the next week the patient became weak and lethargic and they brought her to the hospital on the 24th where she was treated for shortness of breath.

## 2017-06-30 NOTE — Progress Notes (Signed)
Patient is placed on high fowlers position, cuff deflated, suctioned orally and endotracheally and then extubated to 40% Fio2 venti mask

## 2017-06-30 NOTE — Progress Notes (Signed)
PULMONARY / CRITICAL CARE MEDICINE   Name: Dana [name] MRN: [id number] DOB: 11/29/1932    ADMISSION DATE:  23-Jul-2016 CONSULTATION DATE: 02/13/17  REFERRING MD:  Amado CoeGouru  PT PROFILE: [age] F with end-stage COPD intubated in the ED for acute/chronic hypoxemic/hypercarbic respiratory failure and hypersomnolence.  [facility].  During that hospitalization, did not require intubation but did require noninvasive ventilation for a couple of days.  MAJOR EVENTS/TEST RESULTS: 12/24 Admitted 12/25 Mild hypotension due to sedation. Low dose NE initiated. Pulmonary exam improved - weaning in PSV mode initiated 12/26 Failed SBT. CXR c/w PNA. Resp culture + for abundant pseudomonas. Abx adjusted 12/27 Palliative Care consultation: Made DNR 12/27 No progress weaning.  Developed AF with RVR.  Received amiodarone bolus and enteral amiodarone dose increased 12/28 no progress weaning.  Back in NSR.  Remains on low-dose phenylephrine 12/30 family wanted to wait and extubate later  INDWELLING DEVICES:: ETT 12/24 >>   MICRO DATA: Urine 12/24  >> NEG Resp 12/24 >> abundant Pseudomonas (pansensitive) Blood 12/25 >>   ANTIMICROBIALS:  Doxycycline 12/24 >> 12/26 Levofloxacin 12/26 X 1 Cefepime 12/26 >>   SUBJECTIVE:  Remains intubated, on full vent support Patient failed SAT/SBT due to resp muscle weakness Off sedation Plan for extubation today when family arrives  VITAL SIGNS: BP 123/74   Pulse 99   Temp 99.3 F (37.4 C) (Oral)   Resp 15   Ht 4\' 9"  (1.448 m)   Wt 160 lb 4.4 oz (72.7 kg)   SpO2 99%   BMI 34.68 kg/m   HEMODYNAMICS:    VENTILATOR SETTINGS: Vent Mode: PRVC FiO2 (%):  [40 %] 40 % Set Rate:  [15 bmp] 15 bmp Vt Set:  [450 mL] 450 mL PEEP:  [5 cmH20] 5 cmH20  INTAKE / OUTPUT: I/O last 3 completed shifts: In: 1818.1 [I.V.:146.1; NG/GT:1572; IV Piggyback:100] Out: 3410 [Urine:3410]   PHYSICAL EXAMINATION: General:  Intubated Neuro:GCS 10T HEENT: NCAT, sclerae white Cardiovascular: Regular, no M noted Lungs: No wheezes.  Moderate mucopurulent secretions Abdomen: Obese, diminished bowel sounds, no palpable masses Extremities: Mild symmetric LE edema      LABS:  BMET Recent Labs  Lab 06/28/17 0519 06/29/17 1418 06/30/17 0425  NA 151* 153* 153*  K 4.2 4.5 3.6  CL 110 112* 113*  CO2 36* 34* 33*  BUN 52* 63* 54*  CREATININE 0.92 1.13* 1.08*  GLUCOSE 199* 209* 140*    Electrolytes Recent Labs  Lab 06/28/17 0519 06/29/17 1418 06/30/17 0425  CALCIUM 9.6 9.4 9.2  MG 2.4  --  2.6*  PHOS 3.1  --   --     CBC Recent Labs  Lab 06/27/17 0456 06/28/17 0519 06/29/17 0541  WBC 15.4* 12.3* 12.9*  HGB 8.1* 7.5* 7.6*  HCT 25.6* 23.4* 24.6*  PLT 84* 82* 108*    Coag's No results for input(s): APTT, INR in the last 168 hours.  Sepsis Markers Recent Labs  Lab 06/24/17 0645 06/25/17 0327  PROCALCITON 1.23 5.44    ABG Recent Labs  Lab 06/27/17 0854 06/28/17 1133  PHART 7.35 7.36  PCO2ART 72* 74*  PO2ART 62* 64*    Liver Enzymes Recent Labs  Lab 06/24/17 0645  AST 23  ALT 15  ALKPHOS 58  BILITOT 0.6  ALBUMIN 2.6*    Cardiac Enzymes No results for input(s): TROPONINI, PROBNP in the last 168 hours.  Glucose Recent Labs  Lab 06/29/17 0723 06/29/17 1118 06/29/17 1623 06/29/17 1952 06/30/17 0005  06/30/17 0307  GLUCAP 155* 126* 198* 123* 122* 117*    CXR: NSC   ASSESSMENT / PLAN:  PULMONARY A: Severe resp failure with Acute/chronic hypercarbic/hypoxemic respiratory failure End-stage COPD with acute exacerbation Pseudomonas HCAP P:   Cont vent support - settings reviewed and/or adjusted PSV weaning as tolerated Cont vent bundle Daily SBT if/when meets criteria Cont nebulized steroids and bronchodilators Failed SAT/SBT yesterday due to resp muscle weakness Plan again for SAT/SBT and trial of extubation today when family  arrives   CARDIOVASCULAR A:  History of diastolic heart failure Probable pulmonary hypertension Hypotension due to sedation Paroxysmal atrial fibrillation P:  Cont amiodarone at increased dose (400 mg twice daily) Continue phenylephrine to maintain MAP >65 mmHg DNR in event of cardiac arrest  RENAL A:   Severe metabolic alkalosis, improved Hypervolemia P:   Monitor BMET intermittently Monitor I/Os Correct electrolytes as indicated   GASTROINTESTINAL A:   Obesity P:   SUP: IV famotidine Continue TF protocol - initiated 12/25  HEMATOLOGIC A:   Chronic anemia Thrombocytopenia - SQ heparin DC'd 12/28 P:  DVT px: SCDs Monitor CBC intermittently Transfuse per usual guidelines   INFECTIOUS A:   Pseudomonas HCAP Elevated PCT Severe sepsis P:   Monitor temp, WBC count Micro and abx as above   ENDOCRINE A:   Mild hyperglycemia without dx of DM P:   Cont SSI while on systemic steroids Lantus initiated 12/25  NEUROLOGIC A:   Acute encephalopathy ICU/ventilator associated discomfort Poor baseline functional status P:   RASS goal: -1, -2 PAD protocol -propofol, intermittent fentanyl Per goals of care discussion, continue support for now.      Critical Care Time devoted to patient care services described in this note is 34 minutes.   Overall, patient is critically ill, prognosis is guarded.  Patient with Multiorgan failure and at high risk for cardiac arrest and death.   Plan for one way extubation after discussion with family  Lucie Leather, M.D.  [facility]  Medical Director [facility] Medical Director [facility] Cardio-Pulmonary Department

## 2017-06-30 NOTE — Progress Notes (Signed)
MEDICATION RELATED CONSULT NOTE   Pharmacy consulted for electrolyte/glucose/constipation management for 84 yo female ICU patient requiring mechanical intubation. Patients sedation is currently off. Goal potassium ~4, goal magnesium ~ 2.   Plan:  1. KCl 40 meq per tube x 1 and f/u AM labs.   2. Continue hyperglycemia protocol phase I.   3. BM 12/30. Patient receiving tube feeds, will continue senna/docusate 2 tabs BID.   No Known Allergies  Patient Measurements: Height: 4\' 9"  (144.8 cm) Weight: 160 lb 4.4 oz (72.7 kg) IBW/kg (Calculated) : 38.6  Vital Signs: Temp: 99.2 F (37.3 C) (01/01 0800) Temp Source: Axillary (01/01 0800) BP: 103/54 (01/01 0800) Pulse Rate: 96 (01/01 0800) Intake/Output from previous day: 12/31 0701 - 01/01 0700 In: 1138.1 [I.V.:146.1; NG/GT:942; IV Piggyback:50] Out: 1500 [Urine:1500] Intake/Output from this shift: Total I/O In: 251 [I.V.:161; NG/GT:90] Out: -   Labs: Recent Labs    06/28/17 0519 06/29/17 0541 06/29/17 1418 06/30/17 0425  WBC 12.3* 12.9*  --   --   HGB 7.5* 7.6*  --   --   HCT 23.4* 24.6*  --   --   PLT 82* 108*  --   --   CREATININE 0.92  --  1.13* 1.08*  MG 2.4  --   --  2.6*  PHOS 3.1  --   --   --    Estimated Creatinine Clearance: 33.1 mL/min (A) (by C-G formula based on SCr of 1.08 mg/dL (H)).    Medical History: Past Medical History:  Diagnosis Date  . CHF (congestive heart failure) (HCC)   . COPD (chronic obstructive pulmonary disease) [facility](HCC)     Pharmacy will continue to monitor and adjust per consult.   Luisa Harthristy, Shalissa Easterwood D 06/30/2017,8:53 AM

## 2017-06-30 NOTE — Progress Notes (Signed)
FSBS, while not critically low, reveals blood sugars trending down.  Gave 120 ml Orange Juice.  Will continue to monitor.

## 2017-06-30 DEATH — deceased

## 2017-07-01 LAB — BASIC METABOLIC PANEL
Anion gap: 6 (ref 5–15)
BUN: 51 mg/dL — AB (ref 6–20)
CALCIUM: 9.5 mg/dL (ref 8.9–10.3)
CO2: 31 mmol/L (ref 22–32)
CREATININE: 1 mg/dL (ref 0.44–1.00)
Chloride: 116 mmol/L — ABNORMAL HIGH (ref 101–111)
GFR calc Af Amer: 59 mL/min — ABNORMAL LOW (ref >60)
GFR, EST NON AFRICAN AMERICAN: 51 mL/min — AB (ref >60)
GLUCOSE: 101 mg/dL — AB (ref 65–99)
POTASSIUM: 4.5 mmol/L (ref 3.5–5.1)
SODIUM: 153 mmol/L — AB (ref 135–145)

## 2017-07-01 LAB — GLUCOSE, CAPILLARY
GLUCOSE-CAPILLARY: 99 mg/dL (ref 65–99)
Glucose-Capillary: 95 mg/dL (ref 65–99)

## 2017-07-01 MED ORDER — MORPHINE SULFATE (PF) 2 MG/ML IV SOLN
INTRAVENOUS | Status: AC
  Administered 2017-07-01: 2 mg via INTRAVENOUS
  Filled 2017-07-01: qty 1

## 2017-07-01 MED ORDER — SODIUM CHLORIDE 0.9 % IV SOLN
10.0000 mg/h | INTRAVENOUS | Status: DC
  Administered 2017-07-01: 10 mg/h via INTRAVENOUS
  Filled 2017-07-01: qty 10

## 2017-07-01 MED ORDER — MORPHINE SULFATE (PF) 2 MG/ML IV SOLN: 2.0000 mg | Freq: Once | INTRAVENOUS | Status: DC

## 2017-07-01 MED ORDER — MORPHINE BOLUS VIA INFUSION
5.0000 mg | INTRAVENOUS | Status: DC | PRN
  Filled 2017-07-01: qty 20

## 2017-07-07 ENCOUNTER — Telehealth: Payer: Self-pay | Admitting: Internal Medicine

## 2017-07-07 NOTE — Telephone Encounter (Signed)
Given to DK to sign.

## 2017-07-07 NOTE — Telephone Encounter (Signed)
Recieved Death Certificate from [employer] Delivered to [name]

## 2017-07-08 NOTE — Telephone Encounter (Signed)
 Funeral informed death cert is ready for pick up.

## 2017-07-31 NOTE — Progress Notes (Signed)
After further assessment, patient is suffocating and struggling to breathe, she is in dying process. Family at bedside updated and notified of grave prognosis  They have all consented and agreed to comfort care measures   Family are satisfied with Plan of action and management. All questions answered  Lucie LeatherKurian David Nieko Clarin, M.D.  [facility]  Medical Director [facility] Medical Director [facility] Cardio-Pulmonary Department

## 2017-07-31 NOTE — Progress Notes (Signed)
Daily Progress Note   Patient Name: Dana [name]       Date: July 13, 2017 DOB: October 08, 1932  Age: 84 y.o. MRN#: [id number] Attending Physician: Shaune Pollack, MD Primary Care Physician: Barron Alvine, MD Admit Date: 06/08/2017  Reason for Consultation/Follow-up: Establishing goals of care  Subjective: [name] is resting in bed extubated, she is dyspnic. Her daughter is at bedside and states she was doing better than she is now. Neosynephrine drip in place. Just finished a neb treatment, attempt at [facility] by RT and patient quickly desaturated to 78%. Discussed comfort care measures with daughter, she states she will not make any decisions without her daughter present and does not want to shift to full comfort care until her granddaughter arrives, she states she will call granddaughter to come. Non-rebreather replaced and O2 sat to the high 90's. Dr. Belia Heman aware.     Length of Stay: 9  Current Medications: Scheduled Meds:  . amiodarone  200 mg Per Tube Daily  . aspirin  81 mg Per Tube Daily  . budesonide (PULMICORT) nebulizer solution  0.25 mg Nebulization Q6H  . famotidine  20 mg Per Tube Daily  . insulin aspart  2-6 Units Subcutaneous Q4H  . insulin glargine  10 Units Subcutaneous Daily  . ipratropium-albuterol  3 mL Nebulization Q6H  .  morphine injection  2 mg Intravenous Once  . multivitamin  15 mL Per Tube Daily  . predniSONE  20 mg Per Tube Daily  . senna-docusate  2 tablet Per Tube BID  . sodium chloride flush  10-40 mL Intracatheter Q12H  . sodium chloride flush  3 mL Intravenous Q12H  . tamsulosin  0.4 mg Oral QPC supper    Continuous Infusions: . sodium chloride Stopped (06/28/17 0700)  . ceFEPime (MAXIPIME) IV Stopped (2017/07/13 0403)  . phenylephrine (NEO-SYNEPHRINE) Adult  infusion 10 mcg/min (2017-07-13 0800)    PRN Meds: sodium chloride, acetaminophen, albuterol, fentaNYL (SUBLIMAZE) injection, midazolam, ondansetron (ZOFRAN) IV, sodium chloride flush, sodium chloride flush  Physical Exam  Constitutional:  Resting in bed.   Pulmonary/Chest: Accessory muscle usage present. She is in respiratory distress.            Vital Signs: BP (!) 86/59 (BP Location: Left Arm)   Pulse (!) 116   Temp 97.8 F (36.6 C) (Axillary)   Resp Marland Kitchen)  48   Ht 4\' 9"  (1.448 m)   Wt 72.7 kg (160 lb 4.4 oz)   SpO2 (!) 85%   BMI 34.68 kg/m  SpO2: SpO2: (!) 85 % O2 Device: O2 Device: NRB O2 Flow Rate: O2 Flow Rate (L/min): 3 L/min  Intake/output summary:   Intake/Output Summary (Last 24 hours) at 07/23/2017 0855 Last data filed at 07/07/2017 0800 Gross per 24 hour  Intake 407.09 ml  Output 1225 ml  Net -817.91 ml   LBM: Last BM Date: 06/29/17 Baseline Weight: Weight: 68 kg (150 lb) Most recent weight: Weight: 72.7 kg (160 lb 4.4 oz)       Palliative Assessment/Data: 10%    Flowsheet Rows     Most Recent Value  Intake Tab  Referral Department  Hospitalist  Unit at Time of Referral  Med/Surg Unit  Palliative Care Primary Diagnosis  Pulmonary  Date Notified  06/23/17  Palliative Care Type  Return patient Palliative Care  Reason for referral  Clarify Goals of Care, End of Life Care Assistance  Date of Admission  2017-07-05  Date first seen by Palliative Care  06/24/17  # of days Palliative referral response time  1 Day(s)  # of days IP prior to Palliative referral  1  Clinical Assessment  Psychosocial & Spiritual Assessment  Palliative Care Outcomes      Patient Active Problem List   Diagnosis Date Noted  . Acute respiratory failure with hypoxia (HCC) 07-05-2017  . DNR (do not resuscitate) discussion   . Palliative care by specialist   . COPD exacerbation (HCC)   . Sepsis (HCC) 05/26/2017  . Acute respiratory failure with hypoxia and hypercarbia (HCC)   .  Acute on chronic congestive heart failure (HCC)   . Respiratory failure (HCC) 03/24/2016  . CAP (community acquired pneumonia)   . Demand ischemia [facility])     Palliative Care Assessment & Plan   Patient Profile:  30 F withend-stage COPD intubated in the ED for respiratory failure and hypersomnolence. Recently discharged from [facility] after hospitalization for the same. During prior hospitalization, did not require intubation but did require noninvasive ventilation for a couple of days.     Assessment: Patient extubated yesterday.   Recommendations/Plan:  Plans for comfort care when granddaughter arrives.     Code Status:    Code Status Orders  (From admission, onward)        Start     Ordered   06/25/17 1151  Do not attempt resuscitation (DNR)  Continuous    Question Answer Comment  In the event of cardiac or respiratory ARREST Do not call a "code blue"   In the event of cardiac or respiratory ARREST Do not perform Intubation, CPR, defibrillation or ACLS   In the event of cardiac or respiratory ARREST Use medication by any route, position, wound care, and other measures to relive pain and suffering. May use oxygen, suction and manual treatment of airway obstruction as needed for comfort.      06/25/17 1150    Code Status History    Date Active Date Inactive Code Status Order ID Comments User Context   07-05-2017 16:11 06/25/2017 11:50 Full Code [id number]  Ramonita Lab, MD Inpatient   05/26/2017 20:34 06/01/2017 18:21 Full Code [id number]  Arnaldo Natal, MD Inpatient   03/24/2016 16:47 04/01/2016 20:42 Full Code [id number]  de Dios, Hilton Cork, MD Inpatient       Prognosis:   Hours-days.   Discharge Planning:  Anticipated hospital death.  Thank you for allowing the Palliative Medicine Team to assist in the care of this patient.   Time In: 8:20 Time Out: 9:05 Total Time 45 min Prolonged Time Billed No  Per nursing, family running late for meeting at  bedside.     Greater than 50%  of this time was spent counseling and coordinating care related to the above assessment and plan.  Morton Stallrystal Caldwell Kronenberger, NP  Please contact Palliative Medicine Team phone at [phone] for questions and concerns.

## 2017-07-31 NOTE — Progress Notes (Signed)
Patient transitioned to comfort care at 11:00am per family request and MD order. Patient expired at 13:16 with family at bedside. CDS notified, ref# [id number]. Pt is not a suitable donor.

## 2017-07-31 NOTE — Progress Notes (Signed)
CH responded to an OR for End of Life. Family is at bedside and did not need spiritual support at this time. CH is available for follow up as needed.    07/06/2017 1300  Clinical Encounter Type  Visited With Patient;Patient and family together  Visit Type Initial;Spiritual support;Critical Care;Patient actively dying  Referral From Nurse  Consult/Referral To Chaplain  Spiritual Encounters  Spiritual Needs Emotional;Grief support

## 2017-07-31 NOTE — Progress Notes (Signed)
SLP Cancellation Note  Patient Details Name: [name] Koenig MRN: [id number] DOB: 11/08/1932   Cancelled treatment:       Reason Eval/Treat Not Completed: Medical issues which prohibited therapy - Pt is now comfort care. Orders for speech evaluation have been cancelled. Signing off.   Celia B. Murvin NatalBueche, [facility] PaMSP, CCC-SLP Speech Language Pathologist [phone] AuroraBueche, Celia Brown 07/21/2017, 10:54 AM

## 2017-07-31 NOTE — Progress Notes (Signed)
PULMONARY / CRITICAL CARE MEDICINE   Name: Dana [name] MRN: [id number] DOB: [birth year]    ADMISSION DATE:  07/19/2017 CONSULTATION DATE: 2017/07/19  REFERRING MD:  Amado Coe  PT PROFILE: 24 F with end-stage COPD intubated in the ED for acute/chronic hypoxemic/hypercarbic respiratory failure and hypersomnolence.  [facility] from [facility] after hospitalization for same.  During that hospitalization, did not require intubation but did require noninvasive ventilation for a couple of days.  MAJOR EVENTS/TEST RESULTS: 12/24 Admitted 12/25 Mild hypotension due to sedation. Low dose NE initiated. Pulmonary exam improved - weaning in PSV mode initiated 12/26 Failed SBT. CXR c/w PNA. Resp culture + for abundant pseudomonas. Abx adjusted 12/27 Palliative Care consultation: Made DNR 12/27 No progress weaning.  Developed AF with RVR.  Received amiodarone bolus and enteral amiodarone dose increased 12/28 no progress weaning.  Back in NSR.  Remains on low-dose phenylephrine 12/30 family wanted to wait and extubate later 1/1 extubated with family at bedside  INDWELLING DEVICES:: ETT 12/24 >>   MICRO DATA: Urine 12/24  >> NEG Resp 12/24 >> abundant Pseudomonas (pansensitive) Blood 12/25 >>   ANTIMICROBIALS:  Doxycycline 12/24 >> 12/26 Levofloxacin 12/26 X 1 Cefepime 12/26 >>   SUBJECTIVE:  Patient with increased WOB SOB using accessory muscles Severe hypoxia Patient is decompensating Patient is suffering Critically ill   VITAL SIGNS: BP 132/84   Pulse (!) 109   Temp 97.8 F (36.6 C) (Axillary)   Resp (!) 32   Ht 4\' 9"  (1.448 m)   Wt 160 lb 4.4 oz (72.7 kg)   SpO2 91%   BMI 34.68 kg/m   HEMODYNAMICS:    VENTILATOR SETTINGS: Vent Mode: PRVC FiO2 (%):  [40 %-55 %] 45 % Set Rate:  [15 bmp] 15 bmp Vt Set:  [450 mL] 450 mL PEEP:  [5 cmH20] 5 cmH20  INTAKE / OUTPUT: I/O last 3 completed shifts: In: 1369.9 [I.V.:407.4; NG/GT:862.5; IV Piggyback:100] Out: 1525  [Urine:1525]   PHYSICAL EXAMINATION: General:+resp distress Neuro:GCS 10T HEENT: NCAT, sclerae white Cardiovascular: Regular, no M noted Lungs: No wheezes.  Moderate mucopurulent secretions Abdomen: Obese, diminished bowel sounds, no palpable masses Extremities: Mild symmetric LE edema      LABS:  BMET Recent Labs  Lab 06/29/17 1418 06/30/17 0425 07/13/2017 0510  NA 153* 153* 153*  K 4.5 3.6 4.5  CL 112* 113* 116*  CO2 34* 33* 31  BUN 63* 54* 51*  CREATININE 1.13* 1.08* 1.00  GLUCOSE 209* 140* 101*    Electrolytes Recent Labs  Lab 06/28/17 0519 06/29/17 1418 06/30/17 0425 07/20/2017 0510  CALCIUM 9.6 9.4 9.2 9.5  MG 2.4  --  2.6*  --   PHOS 3.1  --   --   --     CBC Recent Labs  Lab 06/27/17 0456 06/28/17 0519 06/29/17 0541  WBC 15.4* 12.3* 12.9*  HGB 8.1* 7.5* 7.6*  HCT 25.6* 23.4* 24.6*  PLT 84* 82* 108*    Coag's No results for input(s): APTT, INR in the last 168 hours.  Sepsis Markers Recent Labs  Lab 06/25/17 0327  PROCALCITON 5.44    ABG Recent Labs  Lab 06/27/17 0854 06/28/17 1133  PHART 7.35 7.36  PCO2ART 72* 74*  PO2ART 62* 64*    Liver Enzymes No results for input(s): AST, ALT, ALKPHOS, BILITOT, ALBUMIN in the last 168 hours.  Cardiac Enzymes No results for input(s): TROPONINI, PROBNP in the last 168 hours.  Glucose Recent Labs  Lab 06/30/17 1146 06/30/17 1618 06/30/17 1954 06/30/17 2338  04-20-18 0353 04-20-18 0726  GLUCAP 147* 137* 94 75 99 95    CXR: NSC   ASSESSMENT / PLAN:  PULMONARY A: Severe resp failure with Acute/chronic hypercarbic/hypoxemic respiratory failure End-stage COPD with acute exacerbation Pseudomonas HCAP Failing extubation Suffering and struggling to breathe P:   Cont nebulized steroids and bronchodilators Failed extubation Oxygen as needed   CARDIOVASCULAR A:  History of diastolic heart failure Probable pulmonary hypertension Hypotension due to sedation Paroxysmal atrial  fibrillation P:  Cont amiodarone at increased dose (400 mg twice daily) Continue phenylephrine to maintain MAP >65 mmHg DNR/DNI in event of cardiac arrest  RENAL A:   Severe metabolic alkalosis, improved Hypervolemia P:   Monitor BMET intermittently Monitor I/Os Correct electrolytes as indicated   GASTROINTESTINAL Hold TF's  HEMATOLOGIC A:   Chronic anemia Thrombocytopenia - SQ heparin DC'd 12/28 P:  DVT px: SCDs Monitor CBC intermittently Transfuse per usual guidelines   INFECTIOUS A:   Pseudomonas HCAP Elevated PCT Severe sepsis P:   Monitor temp, WBC count Micro and abx as above   ENDOCRINE A:   Mild hyperglycemia without dx of DM P:   Cont SSI while on systemic steroids Lantus initiated 12/25  NEUROLOGIC Lethargic/anxious Will give morphine as needed    Critical Care Time devoted to patient care services described in this note is 34 minutes.   Overall, patient is critically ill, prognosis is guarded.  Patient with Multiorgan failure and at high risk for cardiac arrest and death.    Will need to dicussed palliative and comfort care measures with family  Lucie LeatherKurian David Vershawn Westrup, M.D.  [facility]  Medical Director [facility] Medical Director [facility] Cardio-Pulmonary Department

## 2017-07-31 NOTE — Death Summary Note (Signed)
DEATH SUMMARY   Patient Details  Name: Dana [name] MRN: [id number] DOB: 11/05/1932  Admission/Discharge Information   Admit Date:  06/27/2017  Date of Death: Date of Death: 2018-06-10  Time of Death: Time of Death: 1316  Length of Stay: 9  Referring Physician: Barron AlvineGibson, David, MD   Reason(s) for Hospitalization  COPD exacerbation PNEUMONIA  Diagnoses  Preliminary cause of death: pneumonia, COPD exacerbation,CHF Secondary Diagnoses (including complications and co-morbidities):  Active Problems:   Acute respiratory failure with hypoxia [facility](HCC)   Brief Hospital Course (including significant findings, care, treatment, and services provided and events leading to death)  VENT support ICU support IV abx IV steroids  Patient was made DNR by family Patient was extubated-(one way extubation) Patient decompensated-patient was then made comfort care by family   Pertinent Labs and Studies  Significant Diagnostic Studies  Microbiology Recent Results (from the past 240 hour(s))  Culture, respiratory (NON-Expectorated)     Status: None   Collection Time: 06/03/2017  2:56 PM  Result Value Ref Range Status   Specimen Description   Final    TRACHEAL ASPIRATE Performed at [facility], [address] [place], [place] [address]    Special Requests   Final    NONE Performed at [facility], [address] [place], [place] [address]    Gram Stain   Final    ABUNDANT WBC PRESENT,BOTH PMN AND MONONUCLEAR RARE GRAM POSITIVE COCCI Performed at [facility], [address] [place], [place] [address]    Culture ABUNDANT PSEUDOMONAS AERUGINOSA  Final   Report Status 06/26/2017 FINAL  Final   Organism ID, Bacteria PSEUDOMONAS AERUGINOSA  Final      Susceptibility   Pseudomonas aeruginosa - MIC*    CEFTAZIDIME 4 SENSITIVE Sensitive     CIPROFLOXACIN <=0.25 SENSITIVE Sensitive     GENTAMICIN <=1 SENSITIVE Sensitive     IMIPENEM <=0.25 SENSITIVE Sensitive    PIP/TAZO <=4 SENSITIVE Sensitive     CEFEPIME 2 SENSITIVE Sensitive     * ABUNDANT PSEUDOMONAS AERUGINOSA  CULTURE, BLOOD (ROUTINE X 2) w Reflex to ID Panel     Status: None   Collection Time: 06/23/17 12:28 AM  Result Value Ref Range Status   Specimen Description BLOOD LEFT FOREARM  Final   Special Requests   Final    BOTTLES DRAWN AEROBIC AND ANAEROBIC Blood Culture adequate volume   Culture   Final    NO GROWTH 5 DAYS Performed at [facility], [address] [place], [place] [address]    Report Status 06/28/2017 FINAL  Final  CULTURE, BLOOD (ROUTINE X 2) w Reflex to ID Panel     Status: None   Collection Time: 06/23/17 12:36 AM  Result Value Ref Range Status   Specimen Description BLOOD LEFT HAND  Final   Special Requests   Final    BOTTLES DRAWN AEROBIC AND ANAEROBIC Blood Culture adequate volume   Culture   Final    NO GROWTH 5 DAYS Performed at [facility], [address] [place], [place] [address]    Report Status 06/28/2017 FINAL  Final  Urine Culture     Status: None   Collection Time: 06/23/17 12:45 AM  Result Value Ref Range Status   Specimen Description   Final    URINE, RANDOM Performed at [facility], [address] [place], [place] [address]    Special Requests   Final    NONE Performed at [facility], [phone]  [address], [place], [place] [address]    Culture   Final    NO GROWTH Performed at [facility], [address] [place], [place] [address]    Report Status 06/24/2017 FINAL  Final  MRSA PCR Screening     Status: None   Collection Time: 06/23/17  7:38 PM  Result Value Ref Range Status   MRSA by PCR NEGATIVE NEGATIVE Final    Comment:        The GeneXpert MRSA Assay (FDA approved for NASAL specimens only), is one component of a comprehensive MRSA colonization surveillance program. It is not intended to diagnose MRSA infection nor to guide or monitor treatment for MRSA infections. Performed at  [facility], [address] [place], [place] [address]     Lab Basic Metabolic Panel: Recent Labs  Lab 06/27/17 [phone] 06/28/17 0519 06/29/17 1418 06/30/17 0425 07/11/2017 0510  NA 145 151* 153* 153* 153*  K 4.4 4.2 4.5 3.6 4.5  CL 104 110 112* 113* 116*  CO2 34* 36* 34* 33* 31  GLUCOSE 168* 199* 209* 140* 101*  BUN 49* 52* 63* 54* 51*  CREATININE 0.98 0.92 1.13* 1.08* 1.00  CALCIUM 9.5 9.6 9.4 9.2 9.5  MG  --  2.4  --  2.6*  --   PHOS  --  3.1  --   --   --    Liver Function Tests: No results for input(s): AST, ALT, ALKPHOS, BILITOT, PROT, ALBUMIN in the last 168 hours. No results for input(s): LIPASE, AMYLASE in the last 168 hours. No results for input(s): AMMONIA in the last 168 hours. CBC: Recent Labs  Lab 06/25/17 0327 06/26/17 0457 06/27/17 0456 06/28/17 0519 06/29/17 0541  WBC 25.5* 15.2* 15.4* 12.3* 12.9*  HGB 9.6* 8.3* 8.1* 7.5* 7.6*  HCT 30.1* 25.8* 25.6* 23.4* 24.6*  MCV 89.7 89.4 89.8 90.4 91.1  PLT 97* 72* 84* 82* 108*   Cardiac Enzymes: No results for input(s): CKTOTAL, CKMB, CKMBINDEX, TROPONINI in the last 168 hours. Sepsis Labs: Recent Labs  Lab 06/25/17 0327 06/26/17 0457 06/27/17 0456 06/28/17 0519 06/29/17 0541  PROCALCITON 5.44  --   --   --   --   WBC 25.5* 15.2* 15.4* 12.3* 12.9*     Joyceline Maiorino 07/03/2017, 2:18 PM

## 2017-07-31 DEATH — deceased

## 2018-01-07 IMAGING — DX DG CHEST 1V PORT
1 series · 1 of 1 positions shown · non-contrast
Comparison: 03/24/2016

CLINICAL DATA: Respiratory failure

EXAM:
PORTABLE CHEST 1 VIEW

[chest ap]
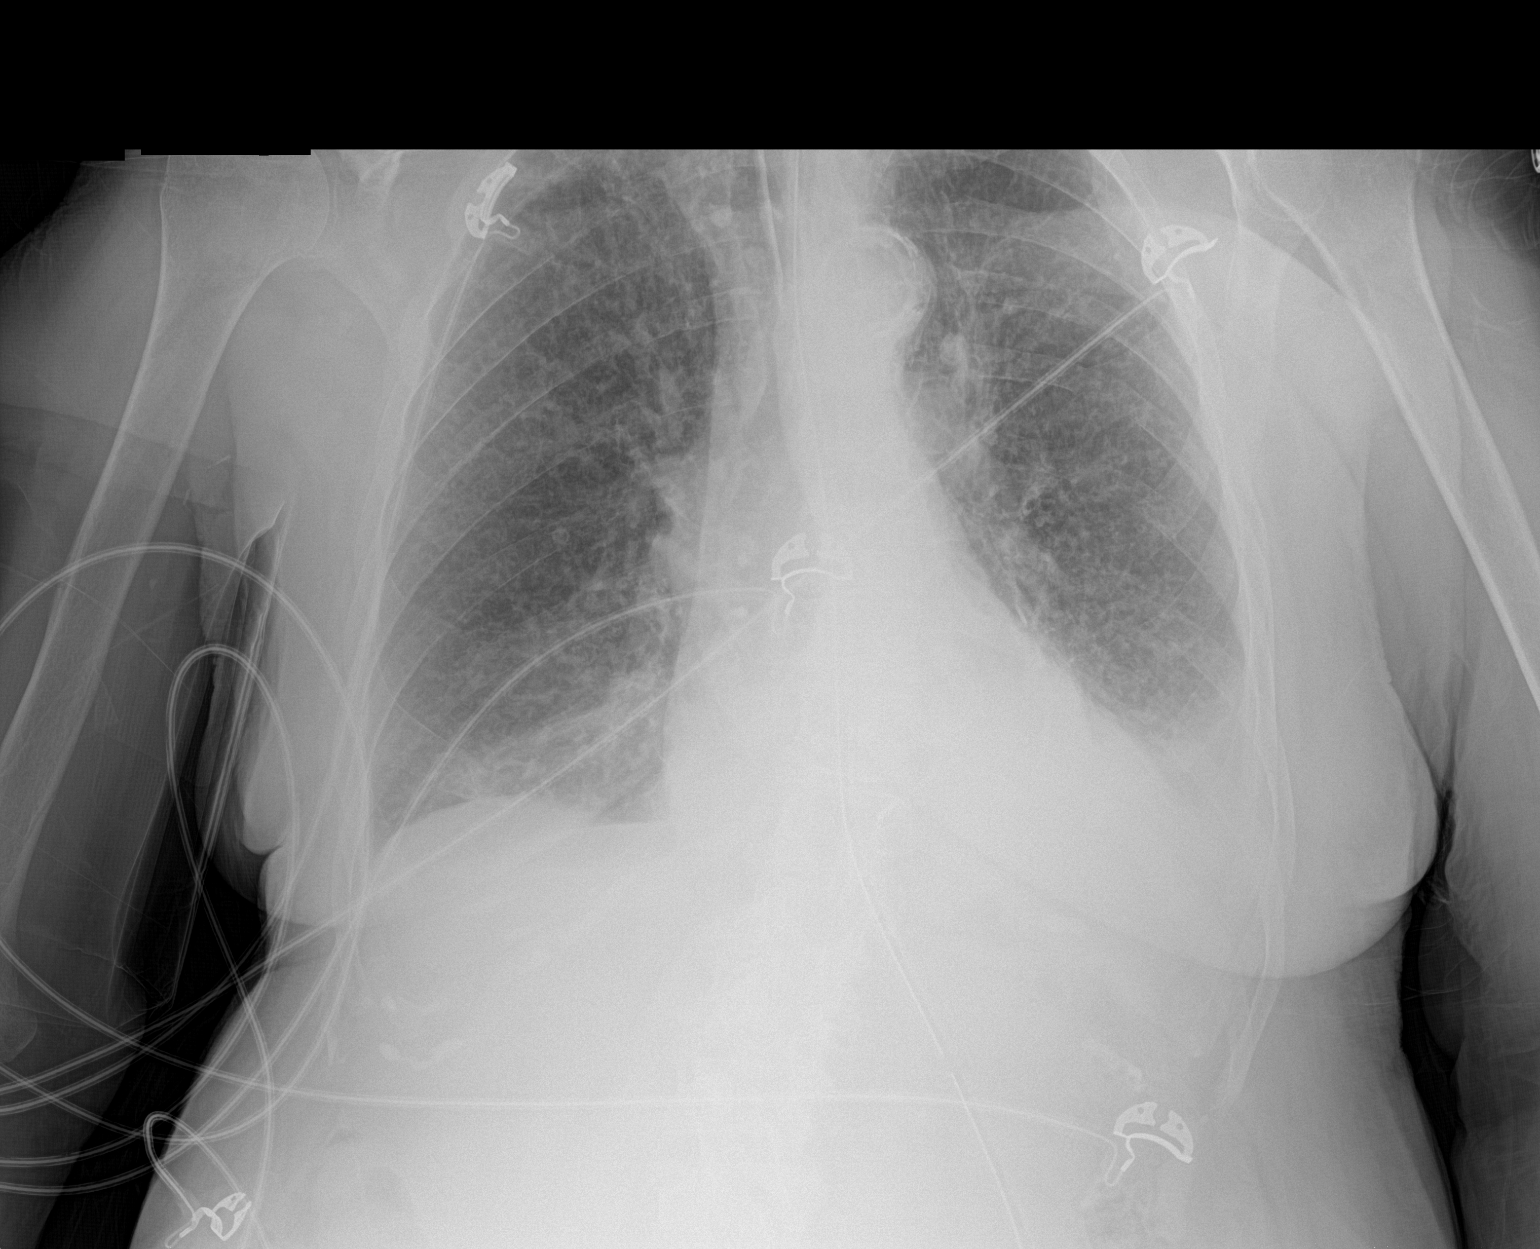

[1 of 1 positions shown; findings below may reference images not displayed]

FINDINGS: There is an ET tube with tip above the carina. Aortic
atherosclerosis noted. Nasogastric tube tip is in the stomach. Heart
size is normal. Bilateral pleural effusions are identified left
greater than right. Diffuse pulmonary edema.
IMPRESSION: Moderate CHF pattern is unchanged compared with previous exam.

## 2019-03-13 IMAGING — DX DG CHEST 1V PORT
1 series · 1 of 1 positions shown · non-contrast
Comparison: 05/26/2017

CLINICAL DATA: Respiratory failure

EXAM:
PORTABLE CHEST 1 VIEW

[chest ap]
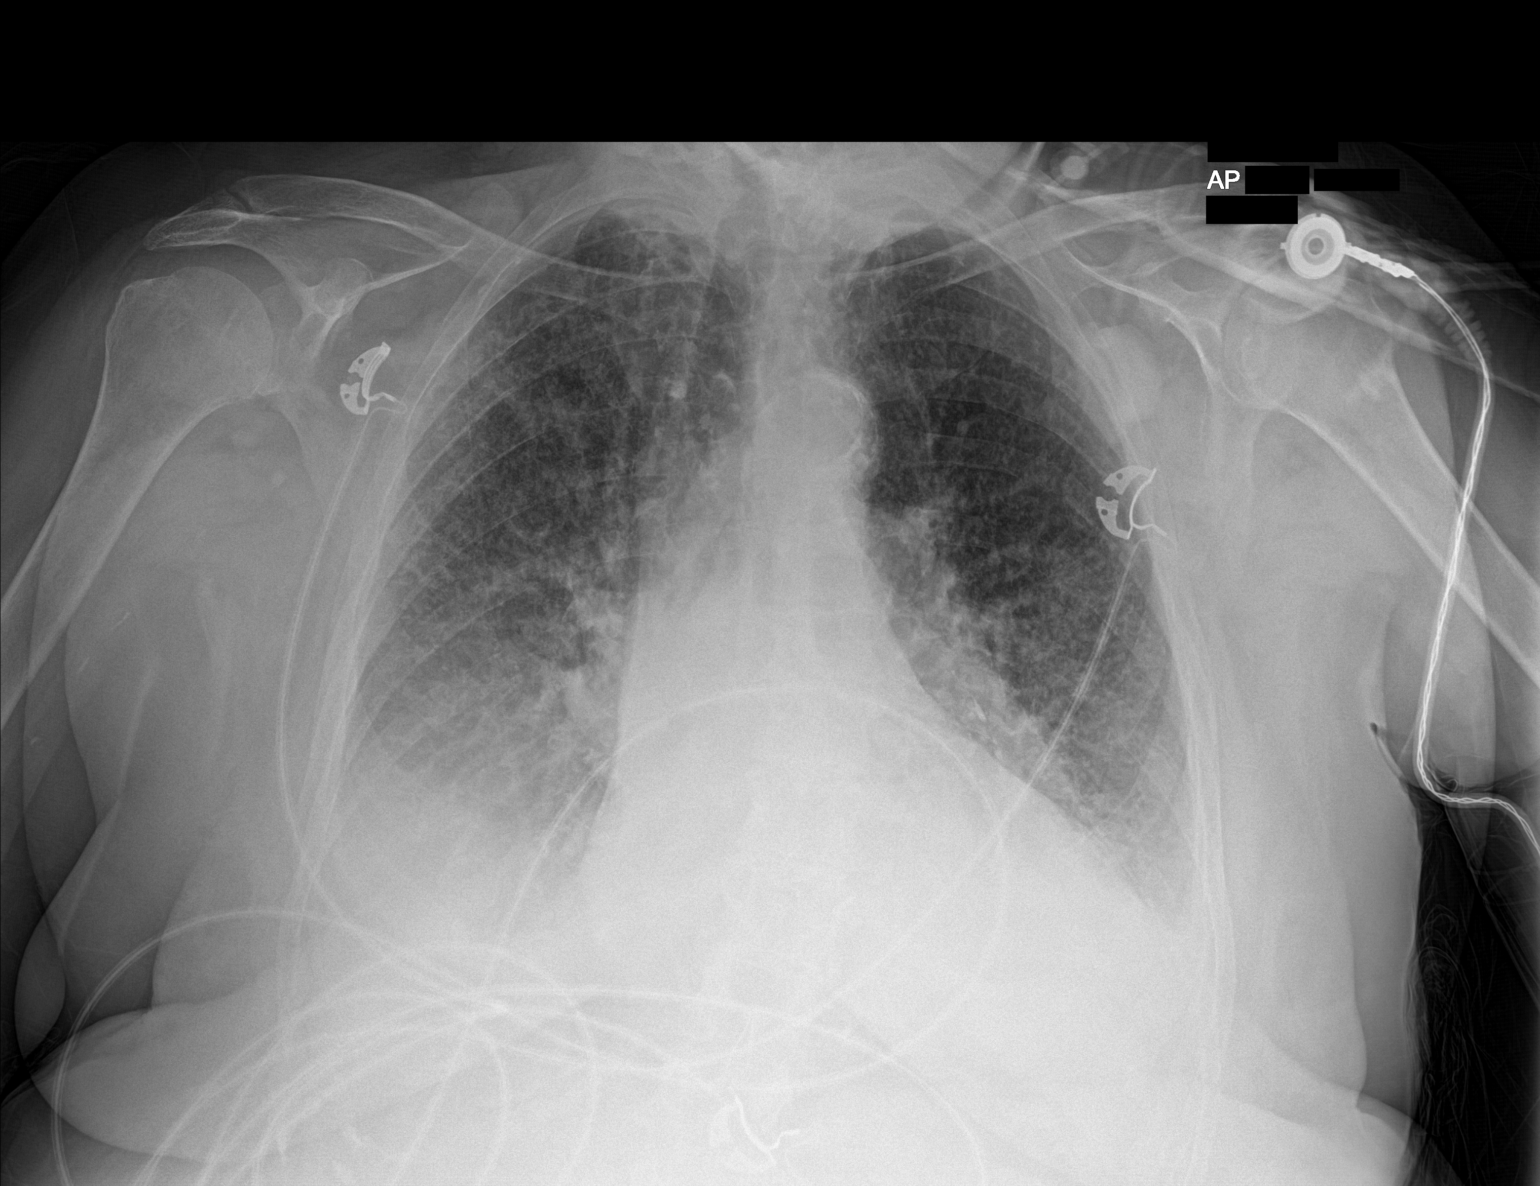

[1 of 1 positions shown; findings below may reference images not displayed]

FINDINGS: Progression of heart failure with edema. Progression of bilateral
effusions and bibasilar atelectasis. Cardiac enlargement. Left upper
lobe linear density has improved.
IMPRESSION: Progressive heart failure and edema. Progression of bilateral
pleural effusions.

## 2019-04-07 IMAGING — DX DG CHEST 1V PORT
1 series · 1 of 1 positions shown · non-contrast
Comparison: June 22, 2017

CLINICAL DATA: Intubation

EXAM:
PORTABLE CHEST 1 VIEW

[chest ap]
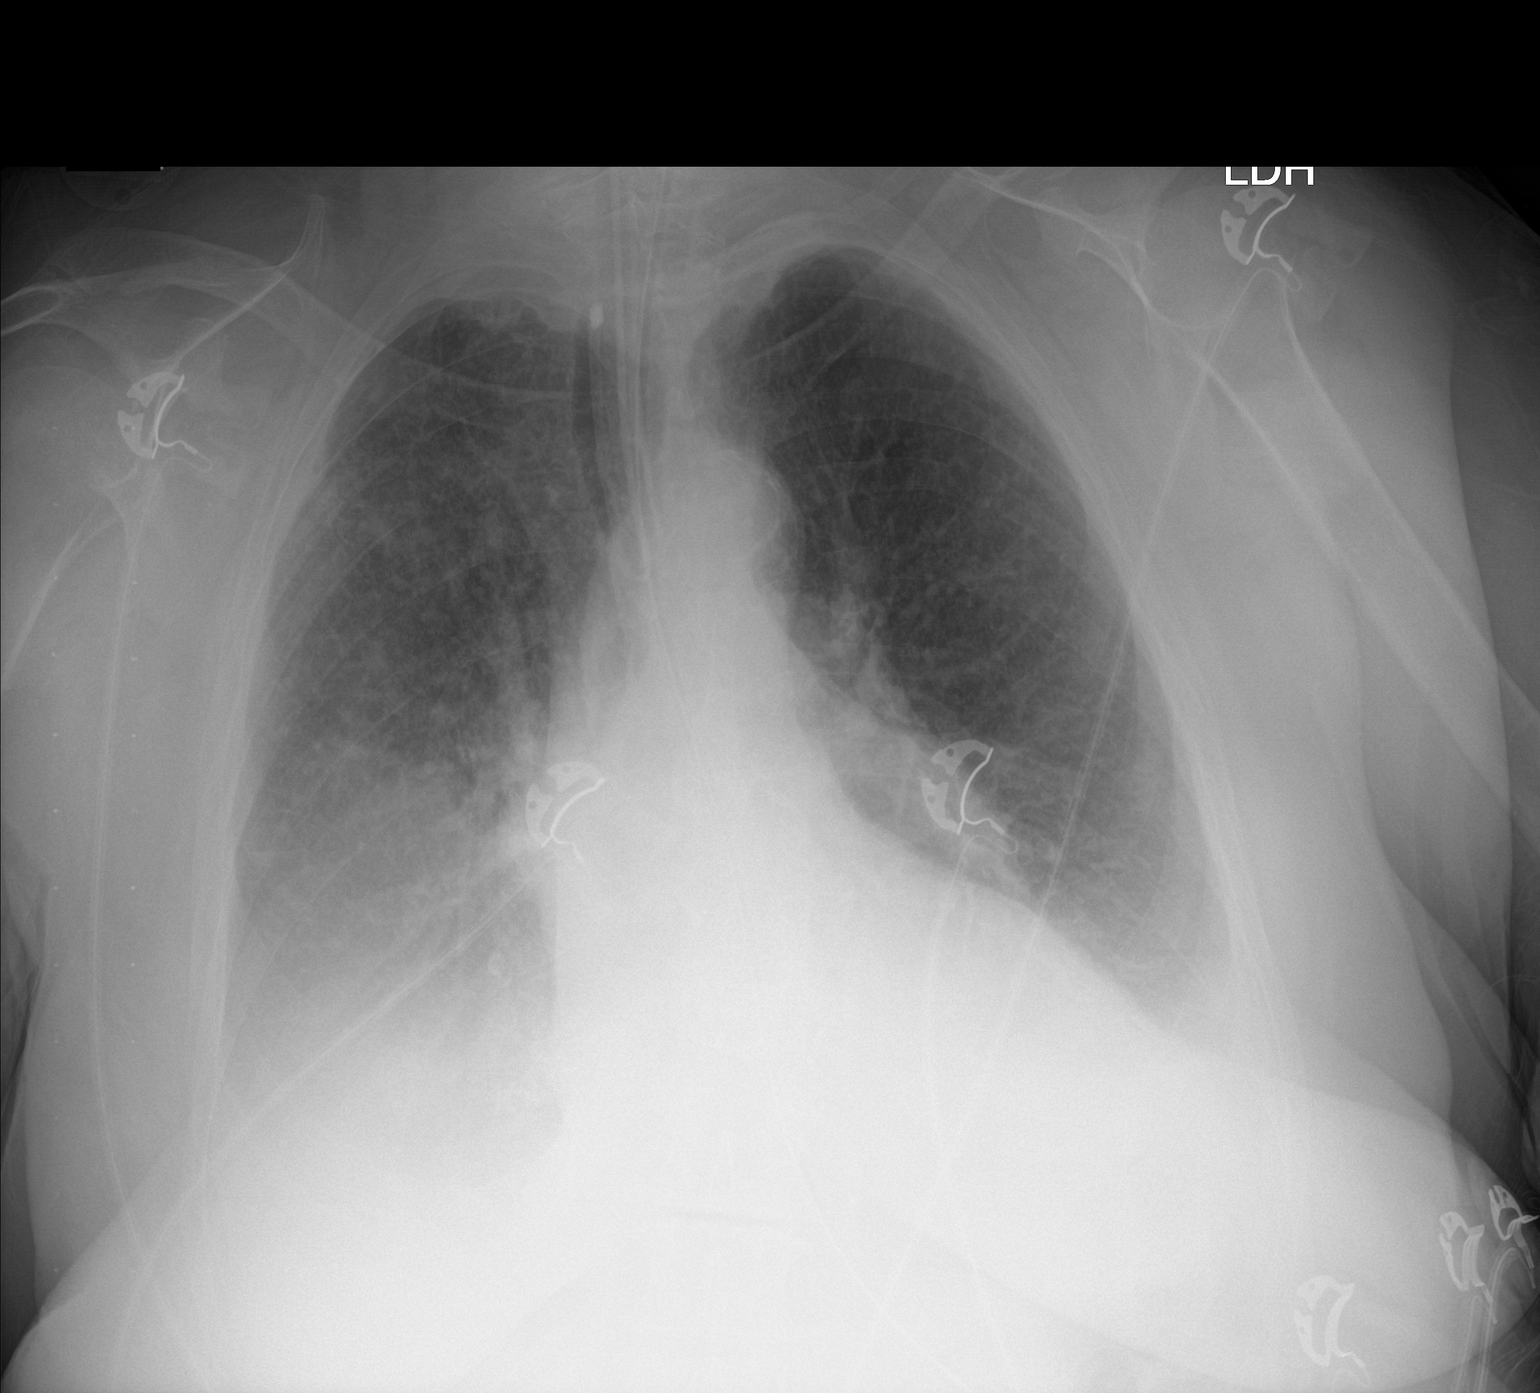

[1 of 1 positions shown; findings below may reference images not displayed]

FINDINGS: The ETT terminates 17 mm above the carina. An OG tube terminates
below today's film. Improving mild edema. Right greater than left
pleural effusions with underlying opacities remain, unchanged.
IMPRESSION: 1. The ETT terminates 17 mm above the carina. Recommend withdrawing
1 cm.
2. Improving edema.
3. Right greater than left pleural effusions with underlying
opacities, similar in the interval.

## 2019-04-07 IMAGING — DX DG CHEST 1V PORT
1 series · 1 of 1 positions shown · non-contrast
Comparison: May 29, 2017

CLINICAL DATA: Shortness of breath.

EXAM:
PORTABLE CHEST 1 VIEW

[chest ap]
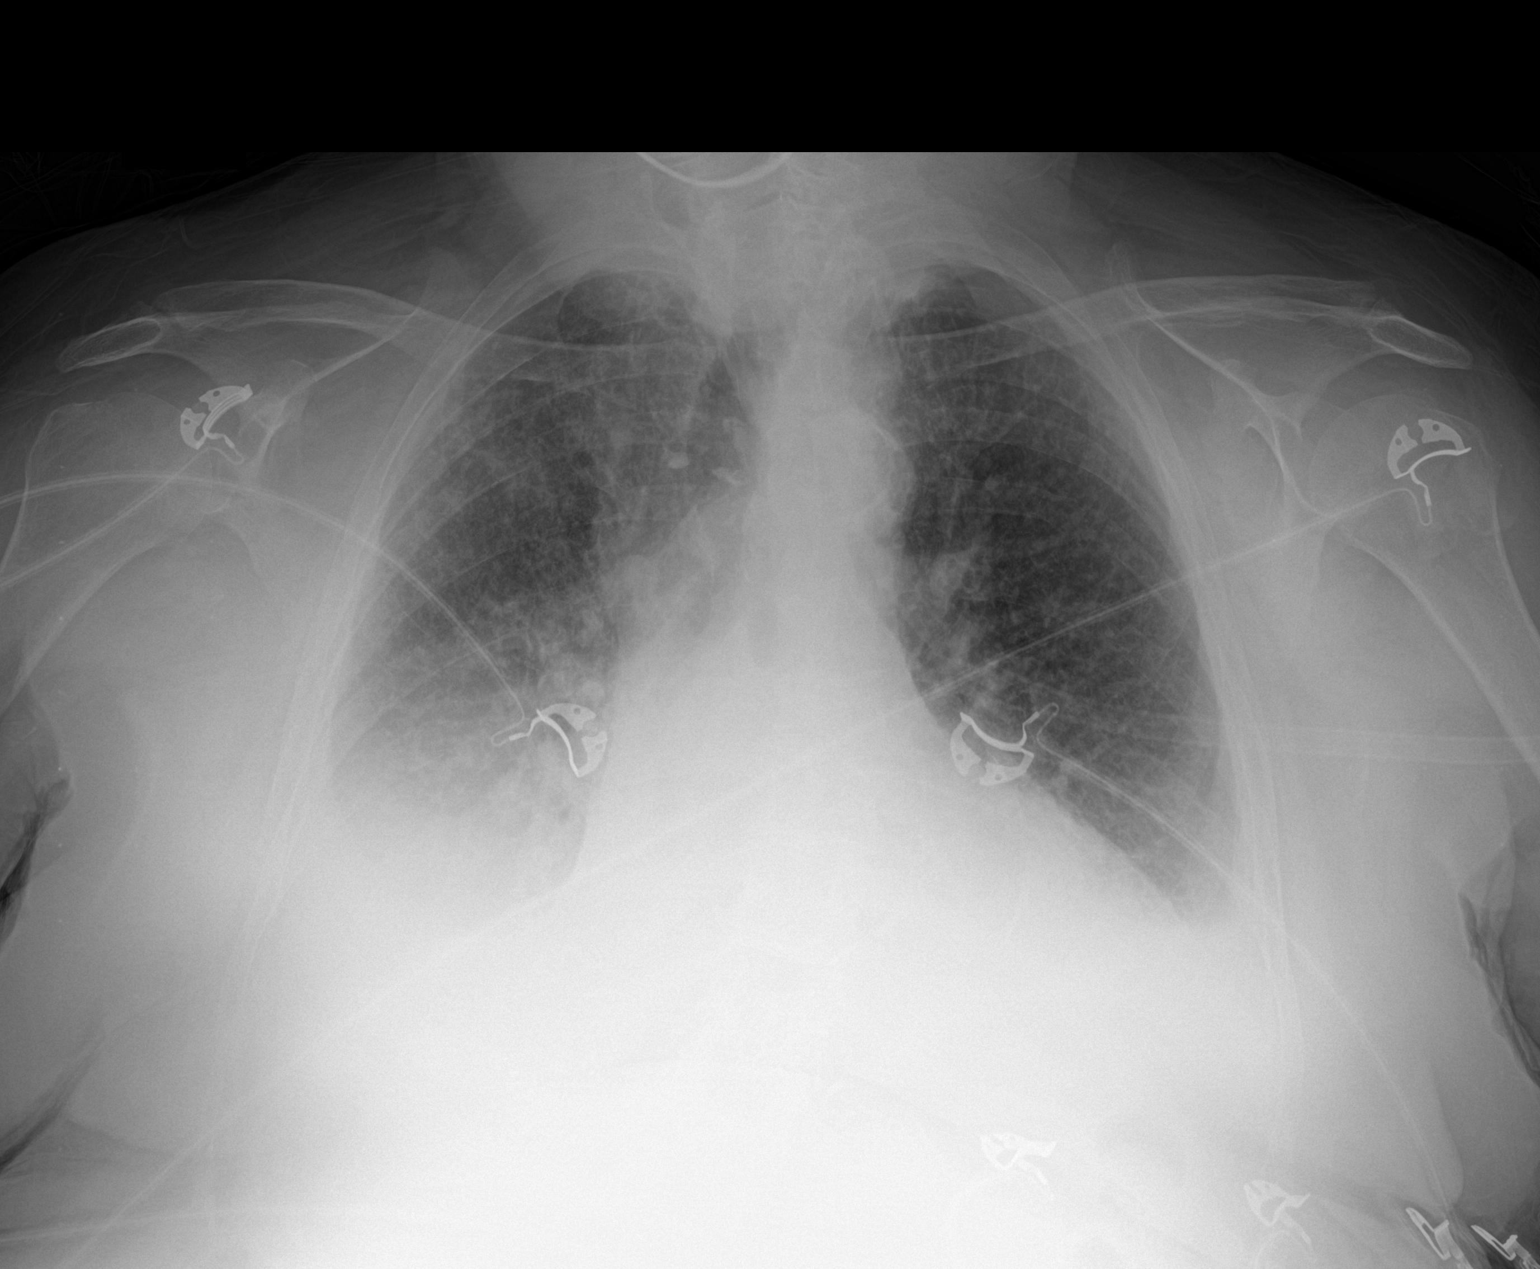

[1 of 1 positions shown; findings below may reference images not displayed]

FINDINGS: Cardiomegaly. Right greater than left pleural effusions with
underlying opacities. Diffuse interstitial opacity. No other
interval change.
IMPRESSION: Findings are consistent with pulmonary edema. Bilateral pleural
effusions with underlying opacities, right greater than left. The
findings are similar in the interval.
